# Patient Record
Sex: Female | Born: 1980 | Race: White | Hispanic: No | Smoking: Never smoker
Health system: Southern US, Community
[De-identification: ages and names within clinical notes are randomized; demographics above are authoritative.]

## PROBLEM LIST (undated history)

## (undated) ENCOUNTER — Inpatient Hospital Stay (HOSPITAL_COMMUNITY): Payer: Self-pay

## (undated) DIAGNOSIS — Z3491 Encounter for supervision of normal pregnancy, unspecified, first trimester: Secondary | ICD-10-CM

## (undated) DIAGNOSIS — F329 Major depressive disorder, single episode, unspecified: Secondary | ICD-10-CM

## (undated) DIAGNOSIS — G43909 Migraine, unspecified, not intractable, without status migrainosus: Secondary | ICD-10-CM

## (undated) DIAGNOSIS — R569 Unspecified convulsions: Secondary | ICD-10-CM

## (undated) DIAGNOSIS — F32A Depression, unspecified: Secondary | ICD-10-CM

## (undated) DIAGNOSIS — F419 Anxiety disorder, unspecified: Secondary | ICD-10-CM

## (undated) HISTORY — DX: Encounter for supervision of normal pregnancy, unspecified, first trimester: Z34.91

## (undated) HISTORY — PX: CHOLECYSTECTOMY: SHX55

## (undated) HISTORY — PX: WISDOM TOOTH EXTRACTION: SHX21

## (undated) HISTORY — DX: Migraine, unspecified, not intractable, without status migrainosus: G43.909

## (undated) HISTORY — DX: Major depressive disorder, single episode, unspecified: F32.9

## (undated) HISTORY — DX: Depression, unspecified: F32.A

---

## 2010-03-18 ENCOUNTER — Emergency Department (HOSPITAL_BASED_OUTPATIENT_CLINIC_OR_DEPARTMENT_OTHER): Admission: EM | Admit: 2010-03-18 | Discharge: 2010-03-18 | Payer: Self-pay | Admitting: Emergency Medicine

## 2010-03-18 ENCOUNTER — Ambulatory Visit: Payer: Self-pay | Admitting: Interventional Radiology

## 2010-05-07 ENCOUNTER — Emergency Department (HOSPITAL_BASED_OUTPATIENT_CLINIC_OR_DEPARTMENT_OTHER): Admission: EM | Admit: 2010-05-07 | Discharge: 2010-05-07 | Payer: Self-pay | Admitting: Emergency Medicine

## 2010-11-03 LAB — DIFFERENTIAL
Eosinophils Relative: 1 % (ref 0–5)
Neutrophils Relative %: 76 % (ref 43–77)

## 2010-11-03 LAB — URINE MICROSCOPIC-ADD ON

## 2010-11-03 LAB — COMPREHENSIVE METABOLIC PANEL
ALT: 25 U/L (ref 0–35)
AST: 21 U/L (ref 0–37)
Albumin: 3.9 g/dL (ref 3.5–5.2)
Alkaline Phosphatase: 152 U/L — ABNORMAL HIGH (ref 39–117)
Calcium: 9 mg/dL (ref 8.4–10.5)
Chloride: 109 mEq/L (ref 96–112)
GFR calc Af Amer: >60 mL/min (ref >60)
Glucose, Bld: 92 mg/dL (ref 70–99)
Sodium: 141 mEq/L (ref 135–145)
Total Bilirubin: 0.6 mg/dL (ref 0.3–1.2)

## 2010-11-03 LAB — CBC
Hemoglobin: 12.7 g/dL (ref 12.0–15.0)
MCH: 28.7 pg (ref 26.0–34.0)
MCHC: 33.9 g/dL (ref 30.0–36.0)
MCV: 84.9 fL (ref 78.0–100.0)
Platelets: 325 10*3/uL (ref 150–400)
RBC: 4.41 MIL/uL (ref 3.87–5.11)
RDW: 12.8 % (ref 11.5–15.5)
WBC: 9.1 10*3/uL (ref 4.0–10.5)

## 2010-11-03 LAB — URINALYSIS, ROUTINE W REFLEX MICROSCOPIC
Ketones, ur: NEGATIVE mg/dL
Leukocytes, UA: NEGATIVE
Nitrite: NEGATIVE
Protein, ur: NEGATIVE mg/dL

## 2010-11-03 LAB — POCT TOXICOLOGY PANEL

## 2010-11-03 LAB — ETHANOL: Alcohol, Ethyl (B): <5 mg/dL (ref 0–10)

## 2011-09-14 IMAGING — CT CT HEAD W/O CM
1 series · 16 of 30 positions shown, 20 images · non-contrast
Comparison: None.

CLINICAL DATA: Fall with injury to region of right forehead.

CT HEAD WITHOUT CONTRAST
TECHNIQUE: Contiguous axial images were obtained from the base of
the skull through the vertex without contrast

[Series 2: head 4.8 h37s · axial · 0.44mm/px · z∈[+1137,+1270]mm · 16 of 32 slices shown, 20 images]
[im 2/32  brain]
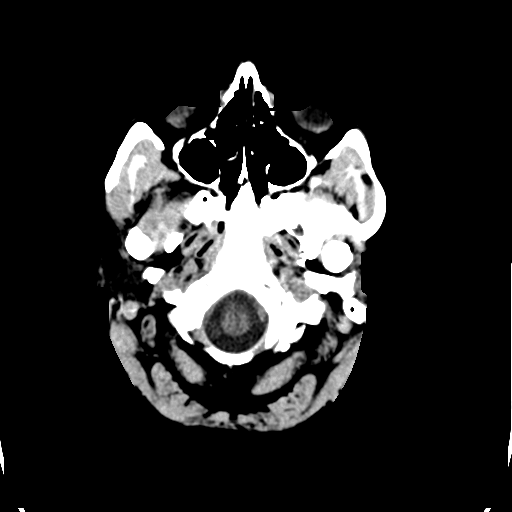
[im 2/32  bone]
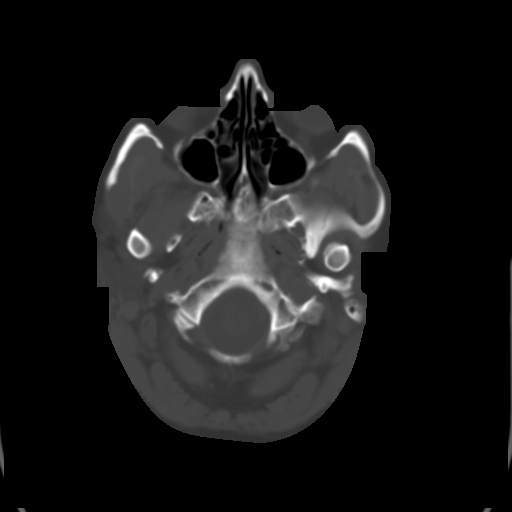
[im 4/32  brain]
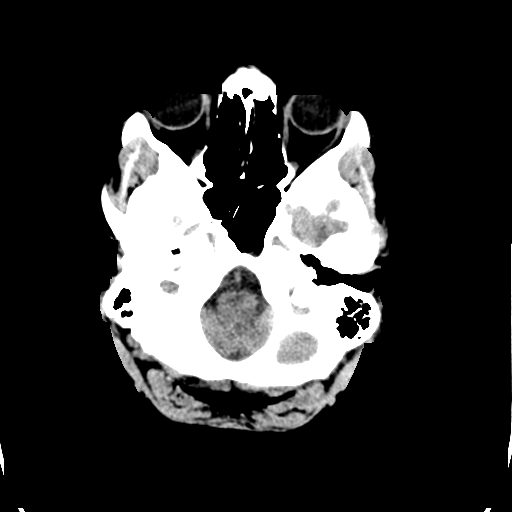
[im 6/32  brain]
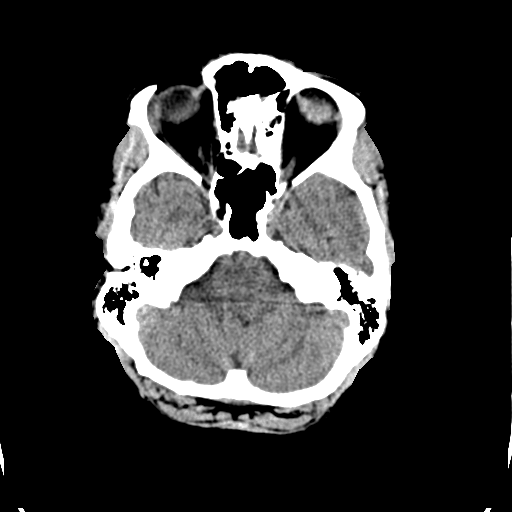
[im 8/32  brain]
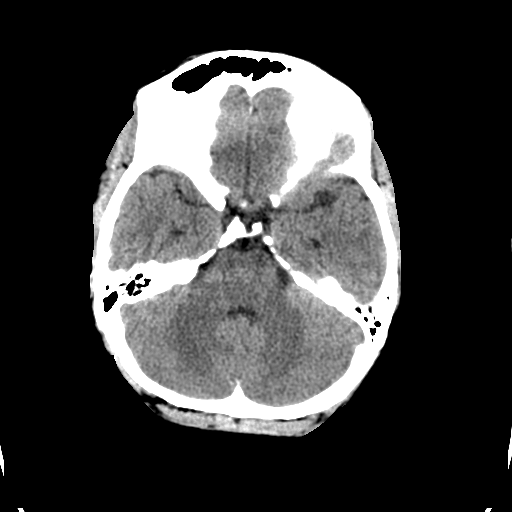
[im 9/32  brain]
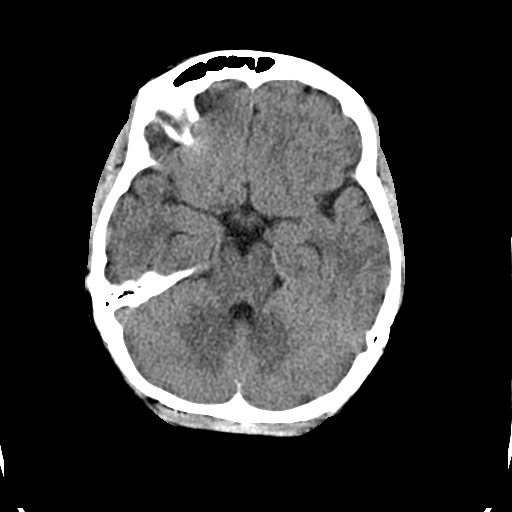
[im 9/32  bone]
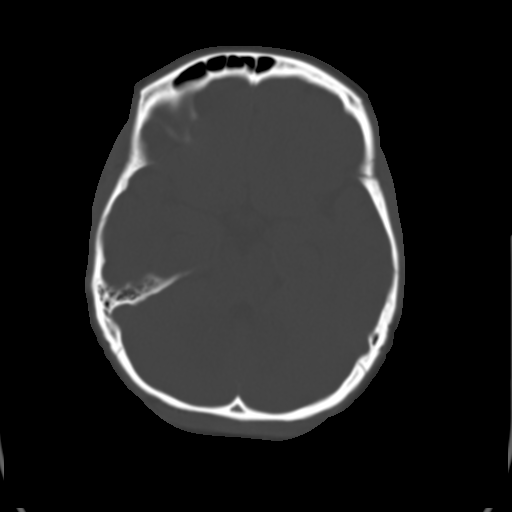
[im 11/32  brain]
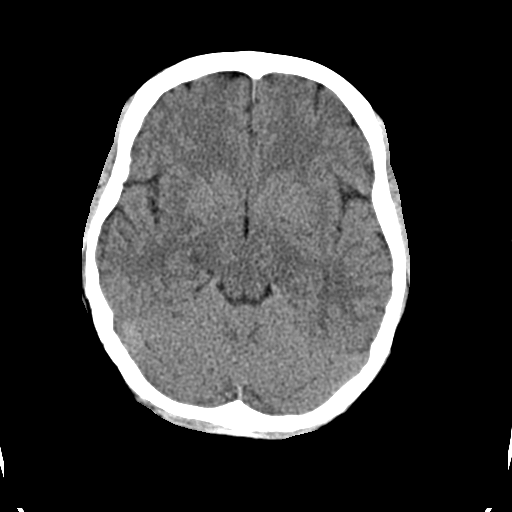
[im 13/32  brain]
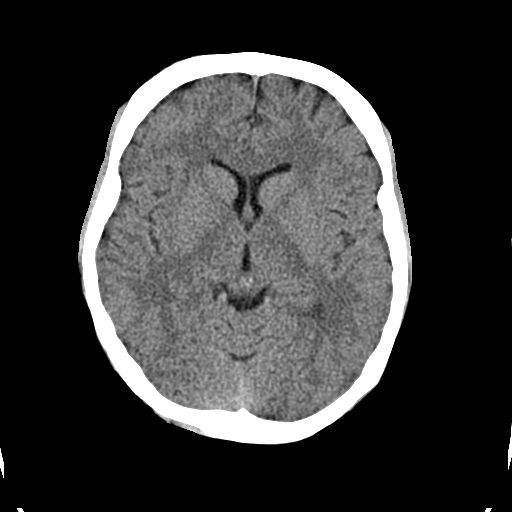
[im 15/32  brain]
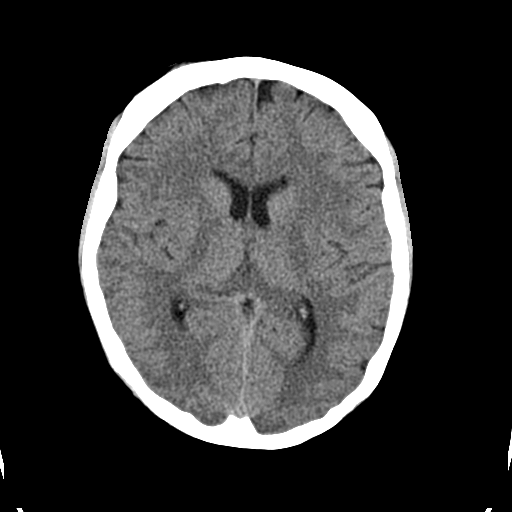
[im 17/32  brain]
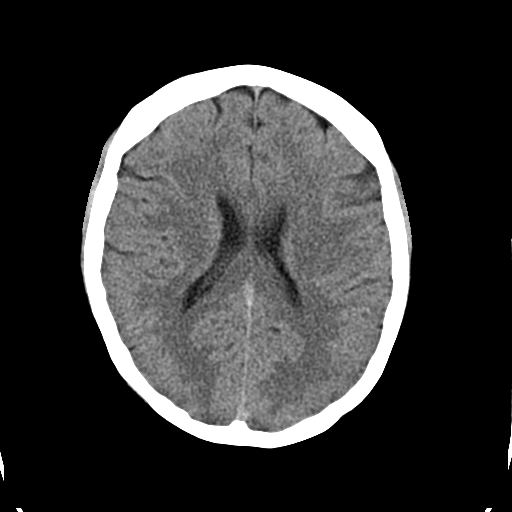
[im 17/32  bone]
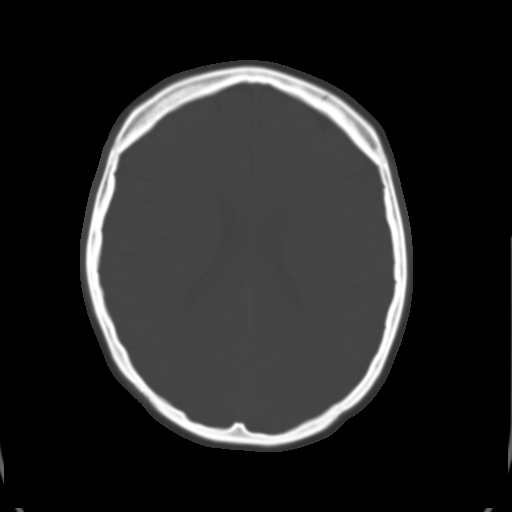
[im 19/32  brain]
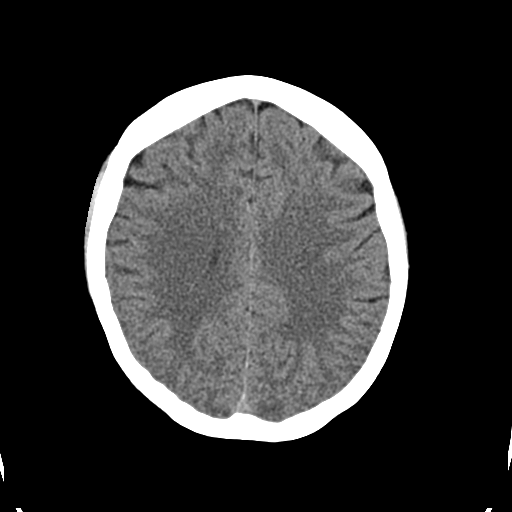
[im 21/32  brain]
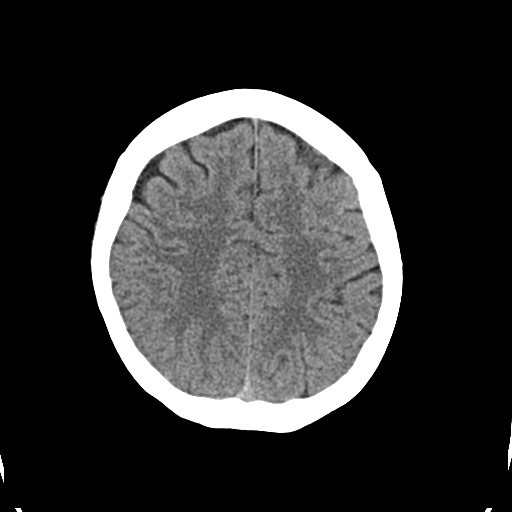
[im 23/32  brain]
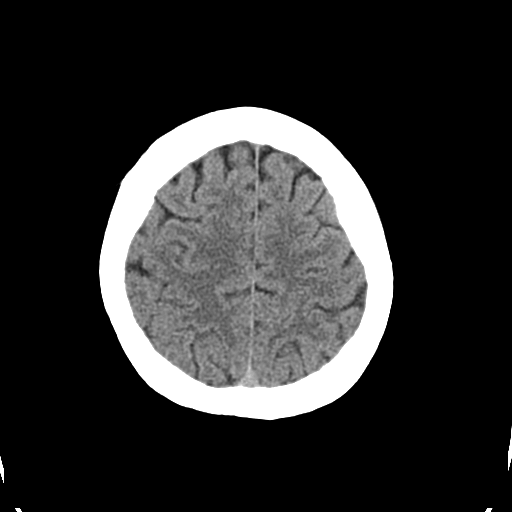
[im 24/32  brain]
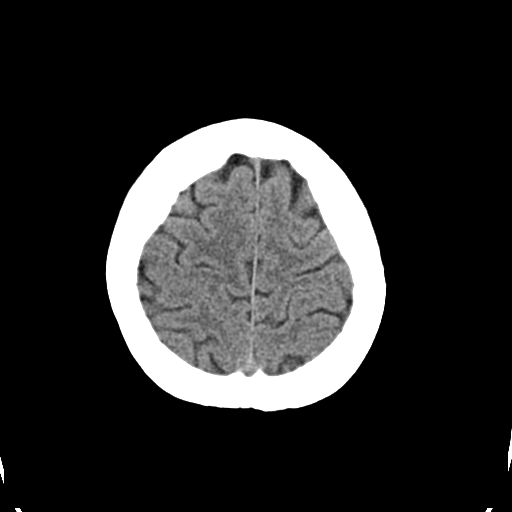
[im 24/32  bone]
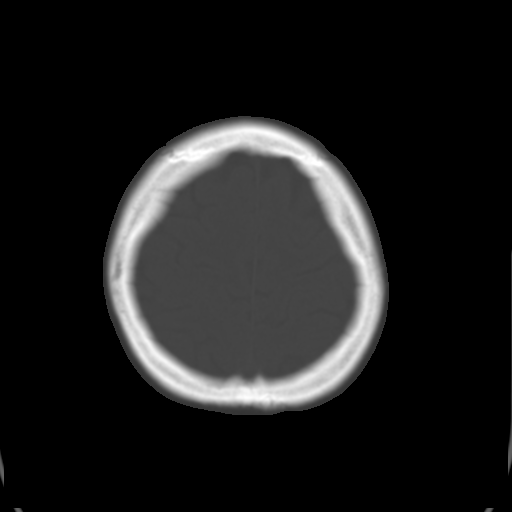
[im 26/32  brain]
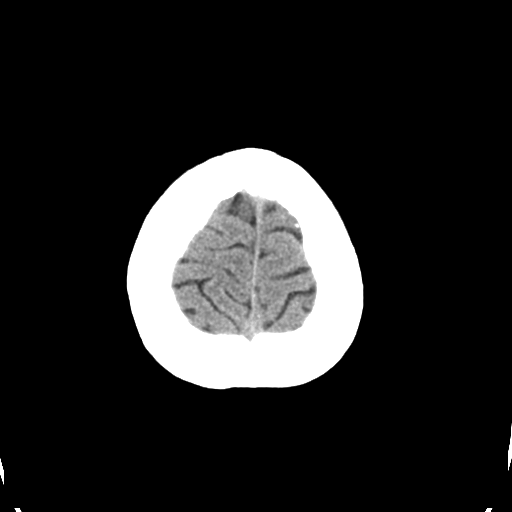
[im 28/32  brain]
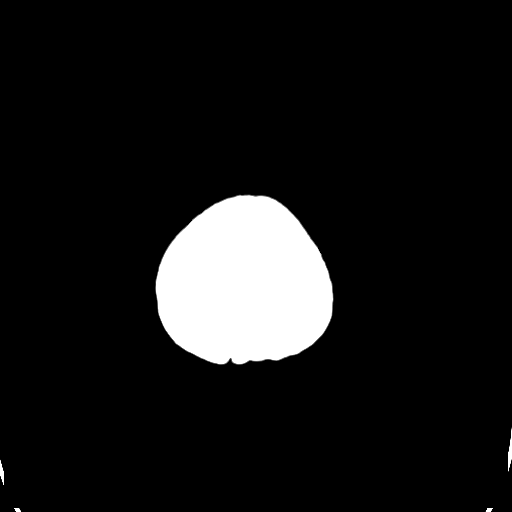
[im 30/32  brain]
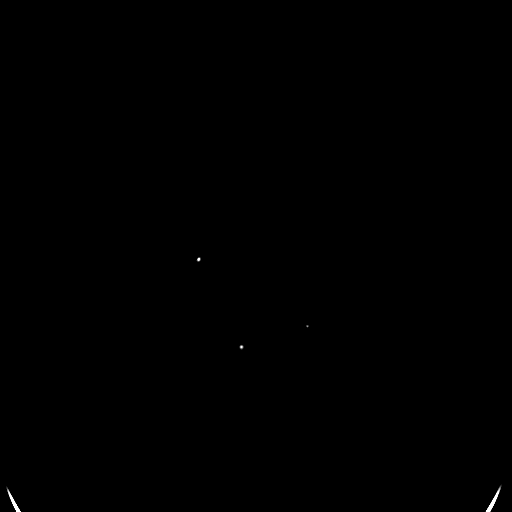

[16 of 30 positions shown; findings below may reference images not displayed]

FINDINGS: The brain has a normal appearance without evidence for
hemorrhage, acute infarction, hydrocephalus, or mass lesion.  There
is no extra axial fluid collection.  Minimal soft tissue swelling
present overlying the right frontal region without evidence of
underlying fracture or foreign body.
IMPRESSION: Normal CT of the head without contrast.

## 2011-12-27 ENCOUNTER — Emergency Department: Payer: Self-pay | Admitting: Emergency Medicine

## 2011-12-27 LAB — PREGNANCY, URINE: Pregnancy Test, Urine: NEGATIVE m[IU]/mL

## 2011-12-27 LAB — URINALYSIS, COMPLETE
Bacteria: NONE SEEN
Bilirubin,UR: NEGATIVE
Ph: 6 (ref 4.5–8.0)
Protein: 100
RBC,UR: 7536 /HPF (ref 0–5)
Squamous Epithelial: 3

## 2012-06-14 ENCOUNTER — Emergency Department: Payer: Self-pay | Admitting: Internal Medicine

## 2013-05-20 ENCOUNTER — Encounter: Payer: Self-pay | Admitting: Nurse Practitioner

## 2013-05-20 ENCOUNTER — Ambulatory Visit (INDEPENDENT_AMBULATORY_CARE_PROVIDER_SITE_OTHER): Payer: BC Managed Care – PPO | Admitting: Nurse Practitioner

## 2013-05-20 VITALS — BP 128/94 | Temp 98.4°F | Ht 63.5 in | Wt 148.0 lb

## 2013-05-20 DIAGNOSIS — J322 Chronic ethmoidal sinusitis: Secondary | ICD-10-CM

## 2013-05-20 MED ORDER — AMOXICILLIN 875 MG PO TABS: 875.0000 mg | ORAL_TABLET | Freq: Two times a day (BID) | ORAL | Status: DC

## 2013-05-20 NOTE — Progress Notes (Signed)
  Subjective:    Patient ID: Tina Weber, female    DOB: [birth year], 32 y.o.   MRN: [id number]  Otalgia  There is pain in the right ear. This is a new problem. The current episode started yesterday. The problem occurs constantly. There has been no fever. Associated symptoms include coughing, drainage, headaches and rhinorrhea. She has tried acetaminophen for the symptoms. The treatment provided mild relief.   has felt hot at times. Ethmoid sinus area headache. Nonproductive cough. Slightly green nasal drainage. No sore throat. Is not birth control, has been trying to conceive for the past 4 months. Just completed a normal menstrual cycle a few days ago. Right ear pain for the past 2 days.    Review of Systems  HENT: Positive for ear pain and rhinorrhea.   Respiratory: Positive for cough.   Neurological: Positive for headaches.       Objective:   Physical Exam NAD. Alert, oriented. TMs clear effusion, no erythema. Pharynx injected with PND noted. Neck supple mild soft nontender adenopathy. Lungs clear. Heart regular rate rhythm.       Assessment & Plan:  Ethmoid sinusitis  Meds ordered this encounter  Medications  . zonisamide (ZONEGRAN) 100 MG capsule    Sig: Take 100 mg by mouth 2 (two) times daily.  . Melatonin 5 MG CAPS    Sig: Take 5 mg by mouth.  Marland Kitchen amoxicillin (AMOXIL) 875 MG tablet    Sig: Take 1 tablet (875 mg total) by mouth 2 (two) times daily.    Dispense:  20 tablet    Refill:  0    Order Specific Question:  Supervising Provider    Answer:  Merlyn Albert [[id number]]   OTC meds as directed for congestion. Call back if symptoms worsen or persist.

## 2013-05-20 NOTE — Patient Instructions (Signed)
Loratadine 10 mg in the morning Benadryl 25 mg in the evening 

## 2013-07-07 ENCOUNTER — Telehealth: Payer: Self-pay | Admitting: Family Medicine

## 2013-07-07 NOTE — Telephone Encounter (Signed)
error 

## 2013-07-07 NOTE — Telephone Encounter (Signed)
Patient says that she is having a hard time sleeping at night and is affecting her job,

## 2013-07-13 ENCOUNTER — Ambulatory Visit (INDEPENDENT_AMBULATORY_CARE_PROVIDER_SITE_OTHER): Payer: BC Managed Care – PPO | Admitting: Family Medicine

## 2013-07-13 ENCOUNTER — Encounter: Payer: Self-pay | Admitting: Family Medicine

## 2013-07-13 VITALS — BP 100/64 | Ht 63.5 in | Wt 152.2 lb

## 2013-07-13 DIAGNOSIS — G4700 Insomnia, unspecified: Secondary | ICD-10-CM

## 2013-07-13 MED ORDER — ALPRAZOLAM 0.5 MG PO TABS: 0.5000 mg | ORAL_TABLET | Freq: Every evening | ORAL | Status: DC | PRN

## 2013-07-13 MED ORDER — ALPRAZOLAM ER 0.5 MG PO TB24: 0.5000 mg | ORAL_TABLET | Freq: Every evening | ORAL | Status: DC | PRN

## 2013-07-13 NOTE — Progress Notes (Signed)
  Subjective:    Patient ID: Tina Weber, female    DOB: 1981/07/05, 32 y.o.   MRN: [id number]  HPIHaving touble sleeping for the past month. Having stress at work. Sleeps about 3 hours at night. Wakes up at 3:30 am to go to work. Has tried otc sleep aid, tylenol pm, and melatonin.   Hx of slight insomnia in the past took tyl pm  Worse this yr,  Lays down when dark, trouble getting to sleep, Took lunesta qhs prn bad taste in mouth  Doing job for three yrs, very stressed  Does not want anti dpressants. Claims no significant depression.  Review of Systems No headache no chest pain no back pain no abdominal pain ROS otherwise negative    Objective:   Physical Exam  Alert oriented x3 HEENT normal. Lungs clear. Heart regular in rhythm.      Assessment & Plan:  Impression insomnia discussed at length. Plan trial of a Presalin each bedtime. Exercise encourage. Stress reduction discussed. WSL

## 2013-07-14 ENCOUNTER — Telehealth: Payer: Self-pay | Admitting: Family Medicine

## 2013-07-14 NOTE — Telephone Encounter (Signed)
Pt.notified

## 2013-07-14 NOTE — Telephone Encounter (Signed)
Med called into [facility] [place].

## 2013-07-14 NOTE — Telephone Encounter (Signed)
Last entry error. May call in refill on Xanax. Thnaks

## 2013-07-14 NOTE — Telephone Encounter (Signed)
It would be no trouble to recheck the ear today, or we can call in a different antibiotic. Also can prescribe Tramadol for pain- caution drowsiness. See what she wants to do

## 2013-07-14 NOTE — Telephone Encounter (Signed)
Patient says she lost her prescription yesterday at [facility] for alprazolam. She would like to know if we can rewrite this for her.

## 2013-07-18 DIAGNOSIS — G4700 Insomnia, unspecified: Secondary | ICD-10-CM | POA: Insufficient documentation

## 2013-08-09 ENCOUNTER — Telehealth: Payer: Self-pay | Admitting: Family Medicine

## 2013-08-09 MED ORDER — ALPRAZOLAM 0.5 MG PO TABS: 0.5000 mg | ORAL_TABLET | Freq: Every evening | ORAL | Status: DC | PRN

## 2013-08-09 NOTE — Telephone Encounter (Signed)
Rx printed and faxed to [facility]. Patient notified.

## 2013-08-09 NOTE — Telephone Encounter (Signed)
ALPRAZolam (XANAX) 0.5 MG tablet   Please refill

## 2013-08-09 NOTE — Telephone Encounter (Signed)
Ok plus 3 ref 

## 2013-10-06 ENCOUNTER — Encounter (INDEPENDENT_AMBULATORY_CARE_PROVIDER_SITE_OTHER): Payer: Self-pay

## 2013-10-06 ENCOUNTER — Ambulatory Visit (INDEPENDENT_AMBULATORY_CARE_PROVIDER_SITE_OTHER): Payer: BC Managed Care – PPO | Admitting: Adult Health

## 2013-10-06 ENCOUNTER — Encounter: Payer: Self-pay | Admitting: Adult Health

## 2013-10-06 VITALS — BP 124/80 | Ht 64.0 in | Wt 154.0 lb

## 2013-10-06 DIAGNOSIS — Z3201 Encounter for pregnancy test, result positive: Secondary | ICD-10-CM

## 2013-10-06 LAB — POCT URINE PREGNANCY: PREG TEST UR: POSITIVE

## 2013-10-06 NOTE — Progress Notes (Signed)
Patient ID: Tina Weber, female   DOB: September 17, 1980, 33 y.o.   MRN: [id number] Pt here for pregnancy test, resulted positive. Pt to return in 1-2 weeks new ob. C/o some light brownish discharge, no pain or cramp. Pt encouraged to monitor if bright red bleeding call our office back. Samples of PNV given.

## 2013-10-13 ENCOUNTER — Telehealth: Payer: Self-pay | Admitting: Women's Health

## 2013-10-13 NOTE — Telephone Encounter (Signed)
Spoke with pt. Pt is early pregnant, has 1st US Friday. She was prescribed a med for seizures, Zonisanide. Was advised to get a safer med. Spoke with Dr. Despina HiddenEure. He advised we usually use Keppra during pregnancy, but he advised for pt to call neurologist and review it with him since they usually manage seizure meds. Pt voiced understanding. JSY

## 2013-10-15 ENCOUNTER — Other Ambulatory Visit: Payer: BC Managed Care – PPO

## 2013-10-15 ENCOUNTER — Ambulatory Visit (INDEPENDENT_AMBULATORY_CARE_PROVIDER_SITE_OTHER): Payer: BC Managed Care – PPO

## 2013-10-15 ENCOUNTER — Other Ambulatory Visit: Payer: Self-pay | Admitting: Obstetrics & Gynecology

## 2013-10-15 DIAGNOSIS — O26849 Uterine size-date discrepancy, unspecified trimester: Secondary | ICD-10-CM

## 2013-10-15 DIAGNOSIS — Z3400 Encounter for supervision of normal first pregnancy, unspecified trimester: Secondary | ICD-10-CM

## 2013-10-15 DIAGNOSIS — O3680X0 Pregnancy with inconclusive fetal viability, not applicable or unspecified: Secondary | ICD-10-CM

## 2013-10-15 DIAGNOSIS — O3410 Maternal care for benign tumor of corpus uteri, unspecified trimester: Secondary | ICD-10-CM

## 2013-10-15 NOTE — Progress Notes (Addendum)
U/S-transvaginal u/s performed, single intrauterine sac noted, no YS or fetal pole identified on today's wxam, pt with irregular cycles, +UPT on 10/05/2013, bilateral adnexa appears wnl with C.L. Noted on Rt, no free fluid noted within pelvis, ?Gs meas c/w 5+1wks, would like to reck for viability and date confirmation, endometrium=10.328mm, with posterior fibroid noted in fundus=11 x 10mm

## 2013-10-16 LAB — HCG, QUANTITATIVE, PREGNANCY: hCG, Beta Chain, Quant, S: 1401.3 m[IU]/mL

## 2013-10-17 ENCOUNTER — Encounter (HOSPITAL_COMMUNITY): Payer: Self-pay

## 2013-10-17 ENCOUNTER — Inpatient Hospital Stay (HOSPITAL_COMMUNITY): Payer: BC Managed Care – PPO

## 2013-10-17 ENCOUNTER — Inpatient Hospital Stay (HOSPITAL_COMMUNITY)
Admission: AD | Admit: 2013-10-17 | Discharge: 2013-10-17 | Disposition: A | Discharge status: Home / Self Care | Payer: BC Managed Care – PPO | Source: Ambulatory Visit | Attending: Obstetrics & Gynecology | Admitting: Obstetrics & Gynecology

## 2013-10-17 DIAGNOSIS — O200 Threatened abortion: Secondary | ICD-10-CM

## 2013-10-17 DIAGNOSIS — R109 Unspecified abdominal pain: Secondary | ICD-10-CM | POA: Insufficient documentation

## 2013-10-17 HISTORY — DX: Unspecified convulsions: R56.9

## 2013-10-17 LAB — WET PREP, GENITAL
Clue Cells Wet Prep HPF POC: NONE SEEN
Trich, Wet Prep: NONE SEEN
YEAST WET PREP: NONE SEEN

## 2013-10-17 LAB — ABO/RH: ABO/RH(D): O POS

## 2013-10-17 LAB — URINALYSIS, ROUTINE W REFLEX MICROSCOPIC
Bilirubin Urine: NEGATIVE
GLUCOSE, UA: NEGATIVE mg/dL
KETONES UR: NEGATIVE mg/dL
Leukocytes, UA: NEGATIVE
NITRITE: NEGATIVE
Protein, ur: NEGATIVE mg/dL
Specific Gravity, Urine: <1.005 — ABNORMAL LOW (ref 1.005–1.030)
Urobilinogen, UA: 0.2 mg/dL (ref 0.0–1.0)
pH: 6 (ref 5.0–8.0)

## 2013-10-17 LAB — CBC
HCT: 38.7 % (ref 36.0–46.0)
Hemoglobin: 13.4 g/dL (ref 12.0–15.0)
MCH: 29.3 pg (ref 26.0–34.0)
MCHC: 34.6 g/dL (ref 30.0–36.0)
MCV: 84.7 fL (ref 78.0–100.0)
Platelets: 211 10*3/uL (ref 150–400)
RBC: 4.57 MIL/uL (ref 3.87–5.11)
RDW: 12.1 % (ref 11.5–15.5)
WBC: 7.6 10*3/uL (ref 4.0–10.5)

## 2013-10-17 LAB — HCG, QUANTITATIVE, PREGNANCY: HCG, BETA CHAIN, QUANT, S: 1209 m[IU]/mL — AB (ref <5)

## 2013-10-17 LAB — URINE MICROSCOPIC-ADD ON

## 2013-10-17 MED ORDER — IBUPROFEN 600 MG PO TABS: 600.0000 mg | ORAL_TABLET | Freq: Four times a day (QID) | ORAL | Status: DC | PRN

## 2013-10-17 NOTE — MAU Provider Note (Signed)
Attestation of Attending Supervision of Advanced Practitioner (PA/CNM/NP): Evaluation and management procedures were performed by the Advanced Practitioner under my supervision and collaboration.  I have reviewed the Advanced Practitioner's note and chart, and I agree with the management and plan.  Decie Verne, MD, FACOG Attending Obstetrician & Gynecologist Faculty Practice, [facility]  

## 2013-10-17 NOTE — MAU Provider Note (Signed)
Chief Complaint: Vaginal Bleeding   First Provider Initiated Contact with Patient 10/17/13 1055     SUBJECTIVE HPI: Tina Weber is a 33 y.o. G1P0 at 108w3d by LMP who presents to maternity admissions reporting vaginal spotting x2 weeks with heavier spotting starting today, accompanied by light cramping starting yesterday.  She was seen at [facility] on Friday and had quant hcg of 1401 and U/S with gestational sac correlating with [redacted]w[redacted]d pregnancy, no yolk sac or fetal pole.  She denies vaginal itching/burning, urinary symptoms, h/a, dizziness, n/v, or fever/chills.     Past Medical History  Diagnosis Date  . Seizures     Last seizure almost one year ago   Past Surgical History  Procedure Laterality Date  . Wisdom tooth extraction    . Cholecystectomy     History   Social History  . Marital Status: Married    Spouse Name: N/A    Number of Children: N/A  . Years of Education: N/A   Occupational History  . Not on file.   Social History Main Topics  . Smoking status: Never Smoker   . Smokeless tobacco: Never Used  . Alcohol Use: Yes     Comment: before positive pregnancy test  . Drug Use: No  . Sexual Activity: Yes    Birth Control/ Protection: None   Other Topics Concern  . Not on file   Social History Narrative  . No narrative on file   No current facility-administered medications on file prior to encounter.   Current Outpatient Prescriptions on File Prior to Encounter  Medication Sig Dispense Refill  . zonisamide (ZONEGRAN) 100 MG capsule Take 100 mg by mouth 2 (two) times daily.       No Known Allergies  ROS: Pertinent items in HPI  OBJECTIVE Blood pressure 140/90, pulse 97, temperature 98.3 F (36.8 C), temperature source Oral, resp. rate 18, last menstrual period 08/25/2013. GENERAL: Well-developed, well-nourished female in no acute distress.  HEENT: Normocephalic HEART: normal rate RESP: normal effort ABDOMEN: Soft, non-tender EXTREMITIES: Nontender, no  edema NEURO: Alert and oriented Pelvic exam: Cervix pink, visually closed, without lesion, moderate amount dark brown bleeding, vaginal walls and external genitalia normal Bimanual exam: Cervix 0/long/high, firm, anterior, neg CMT, uterus nontender, nonenlarged, adnexa without tenderness, enlargement, or mass  LAB RESULTS Results for orders placed during the hospital encounter of 10/17/13 (from the past 24 hour(s))  URINALYSIS, ROUTINE W REFLEX MICROSCOPIC     Status: Abnormal   Collection Time    10/17/13  9:58 AM      Result Value Ref Range   Color, Urine YELLOW  YELLOW   APPearance CLEAR  CLEAR   Specific Gravity, Urine <1.005 (*) 1.005 - 1.030   pH 6.0  5.0 - 8.0   Glucose, UA NEGATIVE  NEGATIVE mg/dL   Hgb urine dipstick TRACE (*) NEGATIVE   Bilirubin Urine NEGATIVE  NEGATIVE   Ketones, ur NEGATIVE  NEGATIVE mg/dL   Protein, ur NEGATIVE  NEGATIVE mg/dL   Urobilinogen, UA 0.2  0.0 - 1.0 mg/dL   Nitrite NEGATIVE  NEGATIVE   Leukocytes, UA NEGATIVE  NEGATIVE  URINE MICROSCOPIC-ADD ON     Status: None   Collection Time    10/17/13  9:58 AM      Result Value Ref Range   Squamous Epithelial / LPF RARE  RARE   RBC / HPF 0-2  <3 RBC/hpf  HCG, QUANTITATIVE, PREGNANCY     Status: Abnormal   Collection Time  10/17/13 10:34 AM      Result Value Ref Range   hCG, Beta Chain, Quant, S 1209 (*) <5 mIU/mL  CBC     Status: None   Collection Time    10/17/13 10:34 AM      Result Value Ref Range   WBC 7.6  4.0 - 10.5 K/uL   RBC 4.57  3.87 - 5.11 MIL/uL   Hemoglobin 13.4  12.0 - 15.0 g/dL   HCT 45.438.7  09.836.0 - 11.946.0 %   MCV 84.7  78.0 - 100.0 fL   MCH 29.3  26.0 - 34.0 pg   MCHC 34.6  30.0 - 36.0 g/dL   RDW 14.712.1  82.911.5 - 56.215.5 %   Platelets 211  150 - 400 K/uL  ABO/RH     Status: None   Collection Time    10/17/13 10:34 AM      Result Value Ref Range   ABO/RH(D) O POS    WET PREP, GENITAL     Status: Abnormal   Collection Time    10/17/13 11:02 AM      Result Value Ref Range    Yeast Wet Prep HPF POC NONE SEEN  NONE SEEN   Trich, Wet Prep NONE SEEN  NONE SEEN   Clue Cells Wet Prep HPF POC NONE SEEN  NONE SEEN   WBC, Wet Prep HPF POC FEW (*) NONE SEEN      IMAGING [place] Ob Comp Less 14 Wks  10/17/2013   CLINICAL DATA:  Bleeding, pregnant  EXAM: OBSTETRIC <14 WK US AND TRANSVAGINAL OB US  TECHNIQUE: Both transabdominal and transvaginal ultrasound examinations were performed for complete evaluation of the gestation as well as the maternal uterus, adnexal regions, and pelvic cul-de-sac. Transvaginal technique was performed to assess early pregnancy.  COMPARISON:  None.  FINDINGS: Intrauterine gestational sac: Single  Yolk sac:  Not seen  Embryo:  Not seen  Cardiac Activity: Not seen  MSD:  5   mm   5 w   0  d  US EDC: 06/19/2014  Maternal uterus/adnexae: Unremarkable ovaries. Hypoechoic 11 x 8 x 10 mm probable posterior fundal fibroid. No free fluid.  IMPRESSION: 1. Probable early intrauterine gestational sac, but no yolk sac, fetal pole, or cardiac activity yet visualized. Recommend follow-up quantitative B-HCG levels and follow-up US in 14 days to confirm and assess viability. This recommendation follows SRU consensus guidelines: Diagnostic Criteria for Nonviable Pregnancy Early in the First Trimester. Malva Limes Engl J Med 2013; 130:8657-84; 369:1443-51.   Electronically Signed   By: Oley Balmaniel  Hassell M.D.   On: 10/17/2013 13:44   [place] Ob Transvaginal  10/17/2013   CLINICAL DATA:  Bleeding, pregnant  EXAM: OBSTETRIC <14 WK US AND TRANSVAGINAL OB US  TECHNIQUE: Both transabdominal and transvaginal ultrasound examinations were performed for complete evaluation of the gestation as well as the maternal uterus, adnexal regions, and pelvic cul-de-sac. Transvaginal technique was performed to assess early pregnancy.  COMPARISON:  None.  FINDINGS: Intrauterine gestational sac: Single  Yolk sac:  Not seen  Embryo:  Not seen  Cardiac Activity: Not seen  MSD:  5   mm   5 w   0  d  US EDC: 06/19/2014  Maternal  uterus/adnexae: Unremarkable ovaries. Hypoechoic 11 x 8 x 10 mm probable posterior fundal fibroid. No free fluid.  IMPRESSION: 1. Probable early intrauterine gestational sac, but no yolk sac, fetal pole, or cardiac activity yet visualized. Recommend follow-up quantitative B-HCG levels and follow-up US in 14 days  to confirm and assess viability. This recommendation follows SRU consensus guidelines: Diagnostic Criteria for Nonviable Pregnancy Early in the First Trimester. Malva Limes Med 2013; 161:0960-45.   Electronically Signed   By: Oley Balm M.D.   On: 10/17/2013 13:44    US Ob Transvaginal  10/15/2013   DATING AND VIABILITY SONOGRAM   FREDRICK DRAY is a 33 y.o. year old G1P0 with LMP 08/25/2013??  She  has irregular menstrual cycles.   She is here today for a confirmatory  initial sonogram. Pt had a +UPT at home on 10/05/2013    GESTATION: SINGLETON     FETAL ACTIVITY:          Heart rate         NO YS or Fetal Pole noted on today's exam  CERVIX: Appears long and closed   ADNEXA: The ovaries are normal.with C.L. Noted on Rt   Endometrium=10.39mm,  Posterior fundal fibroid noted = 11 x 10mm  GESTATIONAL AGE AND  BIOMETRICS:  Gestational criteria: Estimated Date of Delivery: None noted ??LMP and  irregular cycles  Previous Scans:0  GESTATIONAL SAC           4.2 mm         5+1 weeks  CROWN RUMP LENGTH            mm          weeks                                                                               AVERAGE EGA(BY THIS SCAN):   5+1 weeks  WORKING EDD( early ultrasound ):  ~06/16/2014 by ??GS     TECHNICIAN COMMENTS:  U/S-transvaginal u/s performed, single intrauterine sac noted, no YS or  fetal pole identified on today's wxam, pt with irregular cycles, +UPT on  10/05/2013, bilateral adnexa appears wnl with C.L. Noted on Rt, no free  fluid noted within pelvis, ?Gs meas c/w 5+1wks, would like to reck for  viability and date confirmation, endometrium=10.77mm, with posterior  fibroid noted in fundus=11 x  10mm        A copy of this report including all images has been saved and backed up to  a second source for retrieval if needed. All measures and details of the  anatomical scan, placentation, fluid volume and pelvic anatomy are  contained in that report.  Chari Manning 10/15/2013 10:54 AM     ASSESSMENT 1. Threatened abortion, antepartum     PLAN Discharge home with bleeding and ectopic precautions Return to MAU in 48 hours for repeat quant hcg Comfort packet given by RN Return to MAU sooner as needed    Medication List         ibuprofen 600 MG tablet  Commonly known as:  ADVIL,MOTRIN  Take 1 tablet (600 mg total) by mouth every 6 (six) hours as needed.     prenatal multivitamin Tabs tablet  Take 1 tablet by mouth daily at 12 noon.     zonisamide 100 MG capsule  Commonly known as:  ZONEGRAN  Take 100 mg by mouth 2 (two) times daily.       Follow-up Information   Follow  up with THE [facility] [place] [facility] In 2 days. (Return sooner as needed)    Contact information:   [address] [id number] [place] [address] [phone]      Sharen Counter Certified Nurse-Midwife 10/17/2013  3:05 PM

## 2013-10-17 NOTE — Discharge Instructions (Signed)
Threatened Miscarriage °Bleeding during the first 20 weeks of pregnancy is common. This is sometimes called a threatened miscarriage. This is a pregnancy that is threatening to end before the twentieth week of pregnancy. Often this bleeding stops with bed rest or decreased activities as suggested by your caregiver and the pregnancy continues without any more problems. You may be asked to not have sexual intercourse, have orgasms or use tampons until further notice. Sometimes a threatened miscarriage can progress to a complete or incomplete miscarriage. This may or may not require further treatment. Some miscarriages occur before a woman misses a menstrual period and knows she is pregnant. °Miscarriages occur in 15 to 20% of all pregnancies and usually occur during the first 13 weeks of the pregnancy. The exact cause of a miscarriage is usually never known. A miscarriage is natures way of ending a pregnancy that is abnormal or would not make it to term. There are some things that may put you at risk to have a miscarriage, such as: °· Hormone problems. °· Infection of the uterus or cervix. °· Chronic illness, diabetes for example, especially if it is not controlled. °· Abnormal shaped uterus. °· Fibroids in the uterus. °· Incompetent cervix (the cervix is too weak to hold the baby). °· Smoking. °· Drinking too much alcohol. It's best not to drink any alcohol when you are pregnant. °· Taking illegal drugs. °TREATMENT  °When a miscarriage becomes complete and all products of conception (all the tissue in the uterus) have been passed, often no treatment is needed. If you think you passed tissue, save it in a container and take it to your doctor for evaluation. If the miscarriage is incomplete (parts of the fetus or placenta remain in the uterus), further treatment may be needed. The most common reason for further treatment is continued bleeding (hemorrhage) because pregnancy tissue did not pass out of the uterus. This  often occurs if a miscarriage is incomplete. Tissue left behind may also become infected. Treatment usually is dilatation and curettage (the removal of the remaining products of pregnancy. This can be done by a simple sucking procedure (suction curettage) or a simple scraping of the inside of the uterus. This may be done in the hospital or in the caregiver's office. This is only done when your caregiver knows that there is no chance for the pregnancy to proceed to term. This is determined by physical examination, negative pregnancy test, falling pregnancy hormone count and/or, an ultrasound revealing a dead fetus. °Miscarriages are often a very emotional time for prospective mothers and fathers. This is not you or your partners fault. It did not occur because of an inadequacy in you or your partner. Nearly all miscarriages occur because the pregnancy has started off wrongly. At least half of these pregnancies have a chromosomal abnormality. It is almost always not inherited. Others may have developmental problems with the fetus or placenta. This does not always show up even when the products miscarried are studied under the microscope. The miscarriage is nearly always not your fault and it is not likely that you could have prevented it from happening. If you are having emotional and grieving problems, talk to your health care provider and even seek counseling, if necessary, before getting pregnant again. You can begin trying for another pregnancy as soon as your caregiver says it is OK. °HOME CARE INSTRUCTIONS  °· Your caregiver may order bed rest depending on how much bleeding and cramping you are having. You may be limited   to only getting up to go to the bathroom. You may be allowed to continue light activity. You may need to make arrangements for the care of your other children and for any other responsibilities. °· Keep track of the number of pads you use each day, how often you have to change pads and how  saturated (soaked) they are. Record this information. °· DO NOT USE TAMPONS. Do not douche, have sexual intercourse or orgasms until approved by your caregiver. °· You may receive a follow up appointment for re-evaluation of your pregnancy and a repeat blood test. Re-evaluation often occurs after 2 days and again in 4 to 6 weeks. It is very important that you follow-up in the recommended time period. °· If you are Rh negative and the father is Rh positive or you do not know the fathers' blood type, you may receive a shot (Rh immune globulin) to help prevent abnormal antibodies that can develop and affect the baby in any future pregnancies. °SEEK IMMEDIATE MEDICAL CARE IF: °· You have severe cramps in your stomach, back, or abdomen. °· You have a sudden onset of severe pain in the lower part of your abdomen. °· You develop chills. °· You run an unexplained temperature of 101° F (38.3° C) or higher. °· You pass large clots or tissue. Save any tissue for your caregiver to inspect. °· Your bleeding increases or you become light-headed, weak, or have fainting episodes. °· You have a gush of fluid from your vagina. °· You pass out. This could mean you have a tubal (ectopic) pregnancy. °Document Released: 08/05/2005 Document Revised: 10/28/2011 Document Reviewed: 03/21/2008 °ExitCare® Patient Information ©2014 [employer]. ° °Ectopic Pregnancy °An ectopic pregnancy is when the fertilized egg attaches (implants) outside the uterus. Most ectopic pregnancies occur in the fallopian tube. Rarely do ectopic pregnancies occur on the ovary, intestine, pelvis, or cervix. In an ectopic pregnancy, the fertilized egg does not have the ability to develop into a normal, healthy baby.  °A ruptured ectopic pregnancy is one in which the fallopian tube gets torn or bursts and results in internal bleeding. Often there is intense abdominal pain, and sometimes, vaginal bleeding. Having an ectopic pregnancy can be life threatening. If left  untreated, this dangerous condition can lead to a blood transfusion, abdominal surgery, or even death. °CAUSES  °Damage to the fallopian tubes is the suspected cause in most ectopic pregnancies.  °RISK FACTORS °Depending on your circumstances, the risk of having an ectopic pregnancy will vary. The level of risk can be divided into three categories. °High Risk °· You have gone through infertility treatment. °· You have had a previous ectopic pregnancy. °· You have had previous tubal surgery. °· You have had previous surgery to have the fallopian tubes tied (tubal ligation). °· You have tubal problems or diseases. °· You have been exposed to DES. DES is a medicine that was used until 1971 and had effects on babies whose mothers took the medicine. °· You become pregnant while using an intrauterine device (IUD) for birth control.  °Moderate Risk °· You have a history of infertility. °· You have a history of a sexually transmitted infection (STI). °· You have a history of pelvic inflammatory disease (PID). °· You have scarring from endometriosis. °· You have multiple sexual partners. °· You smoke.  °Low Risk °· You have had previous pelvic surgery. °· You use vaginal douching. °· You became sexually active before 32 years of age. °SIGNS AND SYMPTOMS  °An ectopic pregnancy should be   suspected in anyone who has missed a period and has abdominal pain or bleeding. °· You may experience normal pregnancy symptoms, such as: °· Nausea. °· Tiredness. °· Breast tenderness. °· Other symptoms may include: °· Pain with intercourse. °· Irregular vaginal bleeding or spotting. °· Cramping or pain on one side or in the lower abdomen. °· Fast heartbeat. °· Passing out while having a bowel movement. °· Symptoms of a ruptured ectopic pregnancy and internal bleeding may include: °· Sudden, severe pain in the abdomen and pelvis. °· Dizziness or fainting. °· Pain in the shoulder area. °DIAGNOSIS  °Tests that may be performed include: °· A  pregnancy test. °· An ultrasound test. °· Testing the specific level of pregnancy hormone in the bloodstream. °· Taking a sample of uterus tissue (dilation and curettage, D&C). °· Surgery to perform a visual exam of the inside of the abdomen using a thin, lighted tube with a tiny camera on the end (laparoscope). °TREATMENT  °An injection of a medicine called methotrexate may be given. This medicine causes the pregnancy tissue to be absorbed. It is given if: °· The diagnosis is made early. °· The fallopian tube has not ruptured. °· You are considered to be a good candidate for the medicine. °Usually, pregnancy hormone blood levels are checked after methotrexate treatment. This is to be sure the medicine is effective. It may take 4 6 weeks for the pregnancy to be absorbed (though most pregnancies will be absorbed by 3 weeks). °Surgical treatment may be needed. A laparoscope may be used to remove the pregnancy tissue. If severe internal bleeding occurs, a cut (incision) may be made in the lower abdomen (laparotomy), and the ectopic pregnancy is removed. This stops the bleeding. Part of the fallopian tube, or the whole tube, may be removed as well (salpingectomy). After surgery, pregnancy hormone tests may be done to be sure there is no pregnancy tissue left. You may receive an Rho(D) immune globulin shot if you are Rh negative and the father is Rh positive, or if you do not know the Rh type of the father. This is to prevent problems with any future pregnancy. °SEEK IMMEDIATE MEDICAL CARE IF:  °You have any symptoms of an ectopic pregnancy. This is a medical emergency. °Document Released: 09/12/2004 Document Revised: 05/26/2013 Document Reviewed: 03/04/2013 °ExitCare® Patient Information ©2014 [employer]. ° °

## 2013-10-17 NOTE — MAU Note (Signed)
[facility]. Here for increased spotting and cramping that began yesterday. Cramping is in middle of lower abdomen. Has had spotting since before the 18th when pregnancy was verified.

## 2013-10-17 NOTE — Progress Notes (Signed)
Probable miscarriage

## 2013-10-18 LAB — GC/CHLAMYDIA PROBE AMP
CT PROBE, AMP APTIMA: NEGATIVE
GC PROBE AMP APTIMA: NEGATIVE

## 2013-10-19 ENCOUNTER — Telehealth: Payer: Self-pay | Admitting: Adult Health

## 2013-10-19 ENCOUNTER — Other Ambulatory Visit: Payer: BC Managed Care – PPO

## 2013-10-19 DIAGNOSIS — Z029 Encounter for administrative examinations, unspecified: Secondary | ICD-10-CM

## 2013-10-19 DIAGNOSIS — O200 Threatened abortion: Secondary | ICD-10-CM

## 2013-10-19 LAB — HCG, QUANTITATIVE, PREGNANCY: hCG, Beta Chain, Quant, S: 340.8 m[IU]/mL

## 2013-10-19 NOTE — Telephone Encounter (Signed)
Pt aware of labs, [facility] will cx US at 2pm

## 2013-10-25 ENCOUNTER — Encounter: Payer: BC Managed Care – PPO | Admitting: Adult Health

## 2013-10-25 ENCOUNTER — Other Ambulatory Visit: Payer: BC Managed Care – PPO

## 2013-10-25 DIAGNOSIS — O200 Threatened abortion: Secondary | ICD-10-CM

## 2013-10-26 ENCOUNTER — Telehealth: Payer: Self-pay | Admitting: Adult Health

## 2013-10-26 LAB — HCG, QUANTITATIVE, PREGNANCY: hCG, Beta Chain, Quant, S: 11.3 m[IU]/mL

## 2013-10-26 NOTE — Telephone Encounter (Signed)
Left message labs back and QHCG 11.3

## 2013-12-16 ENCOUNTER — Telehealth: Payer: Self-pay | Admitting: Family Medicine

## 2013-12-16 MED ORDER — ALPRAZOLAM 0.5 MG PO TABS: 0.5000 mg | ORAL_TABLET | Freq: Three times a day (TID) | ORAL | Status: DC | PRN

## 2013-12-16 NOTE — Telephone Encounter (Signed)
May have refill plus one additional refill she should do office visit before needing further

## 2013-12-16 NOTE — Telephone Encounter (Signed)
Patient wants refill on xanax 0.5 mg.Last refill was in 12/14 with 3 refills but prescription ran out before she had a chance to get it filled.Had to cancel appointment for tomorrow will she need another to get prescription filled.

## 2013-12-16 NOTE — Telephone Encounter (Signed)
Med faxed to pharmacy-Patient is currently not pregnant-Patient notified.

## 2013-12-16 NOTE — Addendum Note (Signed)
Addended by: Drake LeachBROWN, Theophile Harvie S on: 12/16/2013 05:00 PM   Modules accepted: Orders

## 2013-12-17 ENCOUNTER — Ambulatory Visit: Payer: BC Managed Care – PPO | Admitting: Family Medicine

## 2014-01-13 ENCOUNTER — Ambulatory Visit: Payer: BC Managed Care – PPO | Admitting: Family Medicine

## 2014-02-08 ENCOUNTER — Encounter (HOSPITAL_COMMUNITY): Payer: Self-pay

## 2014-02-11 ENCOUNTER — Ambulatory Visit (INDEPENDENT_AMBULATORY_CARE_PROVIDER_SITE_OTHER): Payer: BC Managed Care – PPO | Admitting: Family Medicine

## 2014-02-11 ENCOUNTER — Encounter: Payer: Self-pay | Admitting: Family Medicine

## 2014-02-11 VITALS — BP 112/74 | Temp 98.3°F | Ht 64.0 in | Wt 156.0 lb

## 2014-02-11 DIAGNOSIS — L03314 Cellulitis of groin: Secondary | ICD-10-CM

## 2014-02-11 DIAGNOSIS — L02219 Cutaneous abscess of trunk, unspecified: Secondary | ICD-10-CM

## 2014-02-11 DIAGNOSIS — L03319 Cellulitis of trunk, unspecified: Secondary | ICD-10-CM

## 2014-02-11 MED ORDER — ALPRAZOLAM 0.5 MG PO TABS: 0.5000 mg | ORAL_TABLET | Freq: Every day | ORAL | Status: DC

## 2014-02-11 MED ORDER — SULFAMETHOXAZOLE-TMP DS 800-160 MG PO TABS: 1.0000 | ORAL_TABLET | Freq: Two times a day (BID) | ORAL | Status: DC

## 2014-02-11 MED ORDER — HYDROCODONE-ACETAMINOPHEN 5-325 MG PO TABS: 1.0000 | ORAL_TABLET | ORAL | Status: DC | PRN

## 2014-02-11 NOTE — Progress Notes (Signed)
   Subjective:    Patient ID: Tina Weber, female    DOB: Dec 17, 1980, 33 y.o.   MRN: [id number]  HPIAbscess on vaginal area. Noticied it 2 days ago. Has been exposed to MRSA.   Patient has been exposed to MRSA through husband.  Recently shaved in the vaginal area.  Developed a small red bump which changed into a larger area which is fair he tender. No obvious discharge. No fever no chills.   Xanax rx used to tay one at night. Last time nsent in directions were one TID. Pt has still been doing one at  Night. Also taking tylenol pm and melotonin.     Review of Systems  no headache no chest pain no back pain no abdominal pain ROS otherwise negative     Objective:   Physical Exam  alert no acute distress. Vitals reviewed. Lungs clear. Heart rare rhythm. Perivaginal erythematous patch discretely tender no fluctuance        Assessment & Plan:   impression cellulitis with family history in nature of lesion need to cover for sure for MRSA discussed with patient. #2 insomnia encourage patient to schedule another visit to discussed. Plan Bactrim DS twice a day. Local measures discussed. Symptomatic care discussed. WSL

## 2014-03-07 ENCOUNTER — Ambulatory Visit (INDEPENDENT_AMBULATORY_CARE_PROVIDER_SITE_OTHER): Payer: BC Managed Care – PPO | Admitting: Adult Health

## 2014-03-07 ENCOUNTER — Encounter: Payer: Self-pay | Admitting: Adult Health

## 2014-03-07 DIAGNOSIS — Z3202 Encounter for pregnancy test, result negative: Secondary | ICD-10-CM

## 2014-03-07 DIAGNOSIS — Z3200 Encounter for pregnancy test, result unknown: Secondary | ICD-10-CM

## 2014-03-07 LAB — POCT URINE PREGNANCY: Preg Test, Ur: NEGATIVE

## 2014-03-07 NOTE — Progress Notes (Signed)
Pt here for pregnancy test. Negative result. Has had 2 positive results at home. Started spotting Saturday. Some cramping today. Advised quant today and we will go from there. Pt voiced understanding. JSY

## 2014-03-08 ENCOUNTER — Telehealth: Payer: Self-pay | Admitting: Adult Health

## 2014-03-08 ENCOUNTER — Telehealth: Payer: Self-pay | Admitting: Women's Health

## 2014-03-08 LAB — HCG, QUANTITATIVE, PREGNANCY: hCG, Beta Chain, Quant, S: 21.8 m[IU]/mL

## 2014-03-08 NOTE — Telephone Encounter (Signed)
No answer, left message for pt to return call. Will recheck HCG 7/22 around 1600 if possible.  Cheral MarkerKimberly R. Naithan Delage, CNM, [facility]WHNP-BC 03/08/2014 5:10 PM

## 2014-03-08 NOTE — Telephone Encounter (Signed)
Left message QHCG 21.8

## 2014-03-09 ENCOUNTER — Other Ambulatory Visit: Payer: BC Managed Care – PPO

## 2014-03-09 DIAGNOSIS — Z3200 Encounter for pregnancy test, result unknown: Secondary | ICD-10-CM

## 2014-03-10 ENCOUNTER — Telehealth: Payer: Self-pay | Admitting: Advanced Practice Midwife

## 2014-03-10 LAB — HCG, QUANTITATIVE, PREGNANCY: HCG, BETA CHAIN, QUANT, S: 16.6 m[IU]/mL

## 2014-03-10 NOTE — Telephone Encounter (Signed)
Left message for pt. Encounter closed. JSY

## 2014-03-10 NOTE — Telephone Encounter (Signed)
Left message letting pt know quant was 16.6 on 03/09/14 which is down from 21.8 on 03/07/14. Advised this will need to be repeated on 7/24 to see if it's still dropping. Pt to call and schedule an appt for another quant on 03/11/14. JSY

## 2014-03-10 NOTE — Telephone Encounter (Signed)
Left message x 1. JSY 

## 2014-03-14 ENCOUNTER — Other Ambulatory Visit: Payer: BC Managed Care – PPO

## 2014-04-14 ENCOUNTER — Ambulatory Visit (INDEPENDENT_AMBULATORY_CARE_PROVIDER_SITE_OTHER): Payer: BC Managed Care – PPO | Admitting: Family Medicine

## 2014-04-14 ENCOUNTER — Encounter: Payer: Self-pay | Admitting: Family Medicine

## 2014-04-14 VITALS — BP 126/88 | Ht 64.0 in | Wt 155.0 lb

## 2014-04-14 DIAGNOSIS — F32A Depression, unspecified: Secondary | ICD-10-CM

## 2014-04-14 DIAGNOSIS — F3289 Other specified depressive episodes: Secondary | ICD-10-CM

## 2014-04-14 DIAGNOSIS — F411 Generalized anxiety disorder: Secondary | ICD-10-CM

## 2014-04-14 DIAGNOSIS — F329 Major depressive disorder, single episode, unspecified: Secondary | ICD-10-CM

## 2014-04-14 MED ORDER — ALPRAZOLAM 1 MG PO TABS: ORAL_TABLET | ORAL | Status: DC

## 2014-04-14 MED ORDER — BUPROPION HCL ER (SR) 150 MG PO TB12: 150.0000 mg | ORAL_TABLET | Freq: Two times a day (BID) | ORAL | Status: DC

## 2014-04-14 NOTE — Progress Notes (Signed)
   Subjective:    Patient ID: Tina Weber, female    DOB: March 07, 1981, 33 y.o.   MRN: [id number]  Anxiety Presents for follow-up visit. Symptoms include excessive worry, insomnia, irritability, nervous/anxious behavior, palpitations and panic. The quality of sleep is poor. Nighttime awakenings: several (at least 4 times).   Treatments tried: xanax one at night and two tylenol pm's.   Job very stressful  Getting worse and worse, sig increased temper,  No chil , fiance married soon in November  Working six d per wk,  Job very physically demanding  wals a lot with the job   No sadness or crying, very anxious an danger  fa's side anxiety and mom's sde depr, but dleeps ok on prozac  celexa and paxil didn't help, semed to make things worse, Patient does not want any medications that can cause sexual side effects.  Non smoker   Review of Systems  Constitutional: Positive for irritability.  Cardiovascular: Positive for palpitations.  Psychiatric/Behavioral: The patient is nervous/anxious and has insomnia.        Objective:   Physical Exam Alert mild anxiousness no major distress. Vitals stable. HEENT normal. Lungs clear. Heart regular in rhythm. Ankles without edema.       Assessment & Plan:  Impression 1 fatigue considerable #2 depression and anxiety now becoming very dysfunctional. Patient reports no suicidal or homicidal thoughts. He's very frustrated by her current mental health. #3 insomnia Of note has seen a counselor and this is helped some but not a lot. Plan add daily Wellbutrin SR rationale discussed. Diet discussed exercise discussed. Each bedtime Xanax when necessary for sleep p.

## 2014-04-17 DIAGNOSIS — F32A Depression, unspecified: Secondary | ICD-10-CM | POA: Insufficient documentation

## 2014-04-17 DIAGNOSIS — F411 Generalized anxiety disorder: Secondary | ICD-10-CM | POA: Insufficient documentation

## 2014-04-17 DIAGNOSIS — F329 Major depressive disorder, single episode, unspecified: Secondary | ICD-10-CM | POA: Insufficient documentation

## 2014-04-20 ENCOUNTER — Ambulatory Visit: Payer: BC Managed Care – PPO | Admitting: Family Medicine

## 2014-05-19 ENCOUNTER — Other Ambulatory Visit: Payer: Self-pay | Admitting: Family Medicine

## 2014-05-20 NOTE — Telephone Encounter (Signed)
Ok plus five monthly ref 

## 2014-05-26 ENCOUNTER — Ambulatory Visit (INDEPENDENT_AMBULATORY_CARE_PROVIDER_SITE_OTHER): Payer: BC Managed Care – PPO | Admitting: Family Medicine

## 2014-05-26 ENCOUNTER — Encounter: Payer: Self-pay | Admitting: Family Medicine

## 2014-05-26 VITALS — BP 132/90 | Ht 64.0 in | Wt 148.0 lb

## 2014-05-26 DIAGNOSIS — F32A Depression, unspecified: Secondary | ICD-10-CM

## 2014-05-26 DIAGNOSIS — F329 Major depressive disorder, single episode, unspecified: Secondary | ICD-10-CM

## 2014-05-26 DIAGNOSIS — G4700 Insomnia, unspecified: Secondary | ICD-10-CM

## 2014-05-26 DIAGNOSIS — F411 Generalized anxiety disorder: Secondary | ICD-10-CM

## 2014-05-26 MED ORDER — ALPRAZOLAM 1 MG PO TABS: ORAL_TABLET | ORAL | Status: DC

## 2014-05-26 NOTE — Progress Notes (Signed)
   Subjective:    Patient ID: Tina Weber, female    DOB: [birth year], 33 y.o.   MRN: [id number]  Anxiety Presents for follow-up visit. Symptoms include depressed mood and insomnia. Symptoms occur most days. The severity of symptoms is interfering with daily activities. The quality of sleep is poor. Nighttime awakenings: several.   There are no known risk factors. Treatments tried: xanax, wellbutrin. The treatment provided mild relief. Compliance with prior treatments has been good. Compliance with medications is 76-100%.   Patient states that she has no other concerns at this time.   Still enjoys job  Found fiance was "cheating on her". Just found this out. 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  Has used primarily to her benzodiazepine for sleep but can also use some during the day for anxiety. States she will use a smaller dose. Patient was adamant that she did not want any serotonin builders. She feels overall the Wellbutrin has helped. She would definitely like to stay on it even though very slight increased risk of seizures.  Review of Systems  Psychiatric/Behavioral: The patient has insomnia.    no headache no chest pain no back pain no abdominal pain ROS otherwise negative     Objective:   Physical Exam Alert somewhat tearful during exam no homicidal or suicidal thoughts. Present with her mother. HEENT normal. Lungs clear. Heart regular in rhythm.       Assessment & Plan:  Impression 1 insomnia #2 anxiety/depression with recent grief discussed #3 seizure disorder neurologist has told patient she can remain on will teach him. Plan maintain Wellbutrin. Maintain Xanax. Increased dose somewhat for daytime use. Drowsy precautions discussed. Report any further seizures. Followup as scheduled. Exercise encourage. WSL

## 2014-06-20 ENCOUNTER — Encounter: Payer: Self-pay | Admitting: Family Medicine

## 2014-07-01 ENCOUNTER — Encounter: Payer: Self-pay | Admitting: Adult Health

## 2014-07-01 ENCOUNTER — Ambulatory Visit (INDEPENDENT_AMBULATORY_CARE_PROVIDER_SITE_OTHER): Payer: BC Managed Care – PPO | Admitting: Adult Health

## 2014-07-01 VITALS — BP 138/70 | Ht 64.0 in | Wt 143.0 lb

## 2014-07-01 DIAGNOSIS — Z3490 Encounter for supervision of normal pregnancy, unspecified, unspecified trimester: Secondary | ICD-10-CM

## 2014-07-01 DIAGNOSIS — Z3201 Encounter for pregnancy test, result positive: Secondary | ICD-10-CM

## 2014-07-01 LAB — POCT URINE PREGNANCY: PREG TEST UR: POSITIVE

## 2014-07-01 NOTE — Patient Instructions (Signed)
First Trimester of Pregnancy The first trimester of pregnancy is from week 1 until the end of week 12 (months 1 through 3). A week after a sperm fertilizes an egg, the egg will implant on the wall of the uterus. This embryo will begin to develop into a baby. Genes from you and your partner are forming the baby. The female genes determine whether the baby is a boy or a girl. At 6-8 weeks, the eyes and face are formed, and the heartbeat can be seen on ultrasound. At the end of 12 weeks, all the baby's organs are formed.  Now that you are pregnant, you will want to do everything you can to have a healthy baby. Two of the most important things are to get good prenatal care and to follow your health care provider's instructions. Prenatal care is all the medical care you receive before the baby's birth. This care will help prevent, find, and treat any problems during the pregnancy and childbirth. BODY CHANGES Your body goes through many changes during pregnancy. The changes vary from woman to woman.   You may gain or lose a couple of pounds at first.  You may feel sick to your stomach (nauseous) and throw up (vomit). If the vomiting is uncontrollable, call your health care provider.  You may tire easily.  You may develop headaches that can be relieved by medicines approved by your health care provider.  You may urinate more often. Painful urination may mean you have a bladder infection.  You may develop heartburn as a result of your pregnancy.  You may develop constipation because certain hormones are causing the muscles that push waste through your intestines to slow down.  You may develop hemorrhoids or swollen, bulging veins (varicose veins).  Your breasts may begin to grow larger and become tender. Your nipples may stick out more, and the tissue that surrounds them (areola) may become darker.  Your gums may bleed and may be sensitive to brushing and flossing.  Dark spots or blotches (chloasma,  mask of pregnancy) may develop on your face. This will likely fade after the baby is born.  Your menstrual periods will stop.  You may have a loss of appetite.  You may develop cravings for certain kinds of food.  You may have changes in your emotions from day to day, such as being excited to be pregnant or being concerned that something may go wrong with the pregnancy and baby.  You may have more vivid and strange dreams.  You may have changes in your hair. These can include thickening of your hair, rapid growth, and changes in texture. Some women also have hair loss during or after pregnancy, or hair that feels dry or thin. Your hair will most likely return to normal after your baby is born. WHAT TO EXPECT AT YOUR PRENATAL VISITS During a routine prenatal visit:  You will be weighed to make sure you and the baby are growing normally.  Your blood pressure will be taken.  Your abdomen will be measured to track your baby's growth.  The fetal heartbeat will be listened to starting around week 10 or 12 of your pregnancy.  Test results from any previous visits will be discussed. Your health care provider may ask you:  How you are feeling.  If you are feeling the baby move.  If you have had any abnormal symptoms, such as leaking fluid, bleeding, severe headaches, or abdominal cramping.  If you have any questions. Other tests   that may be performed during your first trimester include:  Blood tests to find your blood type and to check for the presence of any previous infections. They will also be used to check for low iron levels (anemia) and Rh antibodies. Later in the pregnancy, blood tests for diabetes will be done along with other tests if problems develop.  Urine tests to check for infections, diabetes, or protein in the urine.  An ultrasound to confirm the proper growth and development of the baby.  An amniocentesis to check for possible genetic problems.  Fetal screens for  spina bifida and Down syndrome.  You may need other tests to make sure you and the baby are doing well. HOME CARE INSTRUCTIONS  Medicines  Follow your health care provider's instructions regarding medicine use. Specific medicines may be either safe or unsafe to take during pregnancy.  Take your prenatal vitamins as directed.  If you develop constipation, try taking a stool softener if your health care provider approves. Diet  Eat regular, well-balanced meals. Choose a variety of foods, such as meat or vegetable-based protein, fish, milk and low-fat dairy products, vegetables, fruits, and whole grain breads and cereals. Your health care provider will help you determine the amount of weight gain that is right for you.  Avoid raw meat and uncooked cheese. These carry germs that can cause birth defects in the baby.  Eating four or five small meals rather than three large meals a day may help relieve nausea and vomiting. If you start to feel nauseous, eating a few soda crackers can be helpful. Drinking liquids between meals instead of during meals also seems to help nausea and vomiting.  If you develop constipation, eat more high-fiber foods, such as fresh vegetables or fruit and whole grains. Drink enough fluids to keep your urine clear or pale yellow. Activity and Exercise  Exercise only as directed by your health care provider. Exercising will help you:  Control your weight.  Stay in shape.  Be prepared for labor and delivery.  Experiencing pain or cramping in the lower abdomen or low back is a good sign that you should stop exercising. Check with your health care provider before continuing normal exercises.  Try to avoid standing for long periods of time. Move your legs often if you must stand in one place for a long time.  Avoid heavy lifting.  Wear low-heeled shoes, and practice good posture.  You may continue to have sex unless your health care provider directs you  otherwise. Relief of Pain or Discomfort  Wear a good support bra for breast tenderness.   Take warm sitz baths to soothe any pain or discomfort caused by hemorrhoids. Use hemorrhoid cream if your health care provider approves.   Rest with your legs elevated if you have leg cramps or low back pain.  If you develop varicose veins in your legs, wear support hose. Elevate your feet for 15 minutes, 3-4 times a day. Limit salt in your diet. Prenatal Care  Schedule your prenatal visits by the twelfth week of pregnancy. They are usually scheduled monthly at first, then more often in the last 2 months before delivery.  Write down your questions. Take them to your prenatal visits.  Keep all your prenatal visits as directed by your health care provider. Safety  Wear your seat belt at all times when driving.  Make a list of emergency phone numbers, including numbers for family, friends, the hospital, and police and fire departments. General Tips    Ask your health care provider for a referral to a local prenatal education class. Begin classes no later than at the beginning of month 6 of your pregnancy.  Ask for help if you have counseling or nutritional needs during pregnancy. Your health care provider can offer advice or refer you to specialists for help with various needs.  Do not use hot tubs, steam rooms, or saunas.  Do not douche or use tampons or scented sanitary pads.  Do not cross your legs for long periods of time.  Avoid cat litter boxes and soil used by cats. These carry germs that can cause birth defects in the baby and possibly loss of the fetus by miscarriage or stillbirth.  Avoid all smoking, herbs, alcohol, and medicines not prescribed by your health care provider. Chemicals in these affect the formation and growth of the baby.  Schedule a dentist appointment. At home, brush your teeth with a soft toothbrush and be gentle when you floss. SEEK MEDICAL CARE IF:   You have  dizziness.  You have mild pelvic cramps, pelvic pressure, or nagging pain in the abdominal area.  You have persistent nausea, vomiting, or diarrhea.  You have a bad smelling vaginal discharge.  You have pain with urination.  You notice increased swelling in your face, hands, legs, or ankles. SEEK IMMEDIATE MEDICAL CARE IF:   You have a fever.  You are leaking fluid from your vagina.  You have spotting or bleeding from your vagina.  You have severe abdominal cramping or pain.  You have rapid weight gain or loss.  You vomit blood or material that looks like coffee grounds.  You are exposed to [place] measles and have never had them.  You are exposed to fifth disease or chickenpox.  You develop a severe headache.  You have shortness of breath.  You have any kind of trauma, such as from a fall or a car accident. Document Released: 07/30/2001 Document Revised: 12/20/2013 Document Reviewed: 06/15/2013 [employer] Patient Information 2015 [employer]. This information is not intended to replace advice given to you by your health care provider. Make sure you discuss any questions you have with your health care provider. Return in 2 weeks for dating US 

## 2014-07-01 NOTE — Progress Notes (Signed)
Subjective:     Patient ID: Tina ReddenJennifer D Weber, female   DOB: June 18, 1981, 33 y.o.   MRN: [id number]  HPI Tina Weber is a 33 year old white female in for UPT.  Review of Systems See HPI Reviewed past medical,surgical, social and family history. Reviewed medications and allergies.     Objective:   Physical Exam BP 138/70 mmHg  Ht 5\' 4"  (1.626 m)  Wt 143 lb (64.864 kg)  BMI 24.53 kg/m2  LMP 05/31/2014   UPT +, about 4+3 weeks by LMP with EDD 03/08/15,but period not normal.Try to stop xanax, can continue Wellbutrin, had miscarriage in March [facility].Blood type O+.  Assessment:     Pregnant +UPT    Plan:     Check QHCG and progesterone level today Return in 2 weeks for dating US  Review handout on first trimester

## 2014-07-02 LAB — HCG, QUANTITATIVE, PREGNANCY: HCG, BETA CHAIN, QUANT, S: 1486.9 m[IU]/mL

## 2014-07-02 LAB — PROGESTERONE: PROGESTERONE: 21.7 ng/mL

## 2014-07-04 ENCOUNTER — Telehealth: Payer: Self-pay | Admitting: Adult Health

## 2014-07-04 NOTE — Telephone Encounter (Signed)
[facility] and progesterone was excellent at 21.7Pt aware labs good will give her a copy

## 2014-07-07 ENCOUNTER — Encounter: Payer: BC Managed Care – PPO | Admitting: Adult Health

## 2014-07-08 ENCOUNTER — Encounter: Payer: BC Managed Care – PPO | Admitting: Adult Health

## 2014-07-19 ENCOUNTER — Encounter: Payer: Self-pay | Admitting: Adult Health

## 2014-07-19 ENCOUNTER — Ambulatory Visit (INDEPENDENT_AMBULATORY_CARE_PROVIDER_SITE_OTHER): Payer: BC Managed Care – PPO | Admitting: Adult Health

## 2014-07-19 ENCOUNTER — Ambulatory Visit (INDEPENDENT_AMBULATORY_CARE_PROVIDER_SITE_OTHER): Payer: BC Managed Care – PPO

## 2014-07-19 ENCOUNTER — Other Ambulatory Visit (HOSPITAL_COMMUNITY)
Admission: RE | Admit: 2014-07-19 | Discharge: 2014-07-19 | Disposition: A | Discharge status: Home / Self Care | Payer: BC Managed Care – PPO | Source: Ambulatory Visit | Attending: Adult Health | Admitting: Adult Health

## 2014-07-19 ENCOUNTER — Other Ambulatory Visit: Payer: Self-pay | Admitting: Adult Health

## 2014-07-19 VITALS — BP 110/80 | Wt 143.0 lb

## 2014-07-19 DIAGNOSIS — Z113 Encounter for screening for infections with a predominantly sexual mode of transmission: Secondary | ICD-10-CM | POA: Diagnosis present

## 2014-07-19 DIAGNOSIS — Z01411 Encounter for gynecological examination (general) (routine) with abnormal findings: Secondary | ICD-10-CM | POA: Diagnosis present

## 2014-07-19 DIAGNOSIS — Z3490 Encounter for supervision of normal pregnancy, unspecified, unspecified trimester: Secondary | ICD-10-CM

## 2014-07-19 DIAGNOSIS — Z3491 Encounter for supervision of normal pregnancy, unspecified, first trimester: Secondary | ICD-10-CM

## 2014-07-19 DIAGNOSIS — Z124 Encounter for screening for malignant neoplasm of cervix: Secondary | ICD-10-CM

## 2014-07-19 DIAGNOSIS — Z1371 Encounter for nonprocreative screening for genetic disease carrier status: Secondary | ICD-10-CM

## 2014-07-19 DIAGNOSIS — O09899 Supervision of other high risk pregnancies, unspecified trimester: Secondary | ICD-10-CM | POA: Insufficient documentation

## 2014-07-19 DIAGNOSIS — Z3481 Encounter for supervision of other normal pregnancy, first trimester: Secondary | ICD-10-CM

## 2014-07-19 DIAGNOSIS — O3680X1 Pregnancy with inconclusive fetal viability, fetus 1: Secondary | ICD-10-CM

## 2014-07-19 DIAGNOSIS — G4700 Insomnia, unspecified: Secondary | ICD-10-CM

## 2014-07-19 DIAGNOSIS — Z0283 Encounter for blood-alcohol and blood-drug test: Secondary | ICD-10-CM

## 2014-07-19 DIAGNOSIS — Z1389 Encounter for screening for other disorder: Secondary | ICD-10-CM

## 2014-07-19 DIAGNOSIS — Z114 Encounter for screening for human immunodeficiency virus [HIV]: Secondary | ICD-10-CM

## 2014-07-19 DIAGNOSIS — F411 Generalized anxiety disorder: Secondary | ICD-10-CM

## 2014-07-19 DIAGNOSIS — F32A Depression, unspecified: Secondary | ICD-10-CM

## 2014-07-19 DIAGNOSIS — R87810 Cervical high risk human papillomavirus (HPV) DNA test positive: Secondary | ICD-10-CM | POA: Insufficient documentation

## 2014-07-19 DIAGNOSIS — Z331 Pregnant state, incidental: Secondary | ICD-10-CM

## 2014-07-19 DIAGNOSIS — F329 Major depressive disorder, single episode, unspecified: Secondary | ICD-10-CM

## 2014-07-19 DIAGNOSIS — Z1329 Encounter for screening for other suspected endocrine disorder: Secondary | ICD-10-CM

## 2014-07-19 DIAGNOSIS — Z1151 Encounter for screening for human papillomavirus (HPV): Secondary | ICD-10-CM | POA: Diagnosis present

## 2014-07-19 DIAGNOSIS — O09291 Supervision of pregnancy with other poor reproductive or obstetric history, first trimester: Secondary | ICD-10-CM

## 2014-07-19 DIAGNOSIS — Z0184 Encounter for antibody response examination: Secondary | ICD-10-CM

## 2014-07-19 HISTORY — DX: Encounter for supervision of normal pregnancy, unspecified, first trimester: Z34.91

## 2014-07-19 LAB — POCT URINALYSIS DIPSTICK
Glucose, UA: NEGATIVE
Ketones, UA: NEGATIVE
LEUKOCYTES UA: NEGATIVE
NITRITE UA: NEGATIVE
PROTEIN UA: NEGATIVE

## 2014-07-19 NOTE — Patient Instructions (Signed)
First Trimester of Pregnancy The first trimester of pregnancy is from week 1 until the end of week 12 (months 1 through 3). A week after a sperm fertilizes an egg, the egg will implant on the wall of the uterus. This embryo will begin to develop into a baby. Genes from you and your partner are forming the baby. The female genes determine whether the baby is a boy or a girl. At 6-8 weeks, the eyes and face are formed, and the heartbeat can be seen on ultrasound. At the end of 12 weeks, all the baby's organs are formed.  Now that you are pregnant, you will want to do everything you can to have a healthy baby. Two of the most important things are to get good prenatal care and to follow your health care provider's instructions. Prenatal care is all the medical care you receive before the baby's birth. This care will help prevent, find, and treat any problems during the pregnancy and childbirth. BODY CHANGES Your body goes through many changes during pregnancy. The changes vary from woman to woman.   You may gain or lose a couple of pounds at first.  You may feel sick to your stomach (nauseous) and throw up (vomit). If the vomiting is uncontrollable, call your health care provider.  You may tire easily.  You may develop headaches that can be relieved by medicines approved by your health care provider.  You may urinate more often. Painful urination may mean you have a bladder infection.  You may develop heartburn as a result of your pregnancy.  You may develop constipation because certain hormones are causing the muscles that push waste through your intestines to slow down.  You may develop hemorrhoids or swollen, bulging veins (varicose veins).  Your breasts may begin to grow larger and become tender. Your nipples may stick out more, and the tissue that surrounds them (areola) may become darker.  Your gums may bleed and may be sensitive to brushing and flossing.  Dark spots or blotches (chloasma,  mask of pregnancy) may develop on your face. This will likely fade after the baby is born.  Your menstrual periods will stop.  You may have a loss of appetite.  You may develop cravings for certain kinds of food.  You may have changes in your emotions from day to day, such as being excited to be pregnant or being concerned that something may go wrong with the pregnancy and baby.  You may have more vivid and strange dreams.  You may have changes in your hair. These can include thickening of your hair, rapid growth, and changes in texture. Some women also have hair loss during or after pregnancy, or hair that feels dry or thin. Your hair will most likely return to normal after your baby is born. WHAT TO EXPECT AT YOUR PRENATAL VISITS During a routine prenatal visit:  You will be weighed to make sure you and the baby are growing normally.  Your blood pressure will be taken.  Your abdomen will be measured to track your baby's growth.  The fetal heartbeat will be listened to starting around week 10 or 12 of your pregnancy.  Test results from any previous visits will be discussed. Your health care provider may ask you:  How you are feeling.  If you are feeling the baby move.  If you have had any abnormal symptoms, such as leaking fluid, bleeding, severe headaches, or abdominal cramping.  If you have any questions. Other tests   that may be performed during your first trimester include:  Blood tests to find your blood type and to check for the presence of any previous infections. They will also be used to check for low iron levels (anemia) and Rh antibodies. Later in the pregnancy, blood tests for diabetes will be done along with other tests if problems develop.  Urine tests to check for infections, diabetes, or protein in the urine.  An ultrasound to confirm the proper growth and development of the baby.  An amniocentesis to check for possible genetic problems.  Fetal screens for  spina bifida and Down syndrome.  You may need other tests to make sure you and the baby are doing well. HOME CARE INSTRUCTIONS  Medicines  Follow your health care provider's instructions regarding medicine use. Specific medicines may be either safe or unsafe to take during pregnancy.  Take your prenatal vitamins as directed.  If you develop constipation, try taking a stool softener if your health care provider approves. Diet  Eat regular, well-balanced meals. Choose a variety of foods, such as meat or vegetable-based protein, fish, milk and low-fat dairy products, vegetables, fruits, and whole grain breads and cereals. Your health care provider will help you determine the amount of weight gain that is right for you.  Avoid raw meat and uncooked cheese. These carry germs that can cause birth defects in the baby.  Eating four or five small meals rather than three large meals a day may help relieve nausea and vomiting. If you start to feel nauseous, eating a few soda crackers can be helpful. Drinking liquids between meals instead of during meals also seems to help nausea and vomiting.  If you develop constipation, eat more high-fiber foods, such as fresh vegetables or fruit and whole grains. Drink enough fluids to keep your urine clear or pale yellow. Activity and Exercise  Exercise only as directed by your health care provider. Exercising will help you:  Control your weight.  Stay in shape.  Be prepared for labor and delivery.  Experiencing pain or cramping in the lower abdomen or low back is a good sign that you should stop exercising. Check with your health care provider before continuing normal exercises.  Try to avoid standing for long periods of time. Move your legs often if you must stand in one place for a long time.  Avoid heavy lifting.  Wear low-heeled shoes, and practice good posture.  You may continue to have sex unless your health care provider directs you  otherwise. Relief of Pain or Discomfort  Wear a good support bra for breast tenderness.   Take warm sitz baths to soothe any pain or discomfort caused by hemorrhoids. Use hemorrhoid cream if your health care provider approves.   Rest with your legs elevated if you have leg cramps or low back pain.  If you develop varicose veins in your legs, wear support hose. Elevate your feet for 15 minutes, 3-4 times a day. Limit salt in your diet. Prenatal Care  Schedule your prenatal visits by the twelfth week of pregnancy. They are usually scheduled monthly at first, then more often in the last 2 months before delivery.  Write down your questions. Take them to your prenatal visits.  Keep all your prenatal visits as directed by your health care provider. Safety  Wear your seat belt at all times when driving.  Make a list of emergency phone numbers, including numbers for family, friends, the hospital, and police and fire departments. General Tips    Ask your health care provider for a referral to a local prenatal education class. Begin classes no later than at the beginning of month 6 of your pregnancy.  Ask for help if you have counseling or nutritional needs during pregnancy. Your health care provider can offer advice or refer you to specialists for help with various needs.  Do not use hot tubs, steam rooms, or saunas.  Do not douche or use tampons or scented sanitary pads.  Do not cross your legs for long periods of time.  Avoid cat litter boxes and soil used by cats. These carry germs that can cause birth defects in the baby and possibly loss of the fetus by miscarriage or stillbirth.  Avoid all smoking, herbs, alcohol, and medicines not prescribed by your health care provider. Chemicals in these affect the formation and growth of the baby.  Schedule a dentist appointment. At home, brush your teeth with a soft toothbrush and be gentle when you floss. SEEK MEDICAL CARE IF:   You have  dizziness.  You have mild pelvic cramps, pelvic pressure, or nagging pain in the abdominal area.  You have persistent nausea, vomiting, or diarrhea.  You have a bad smelling vaginal discharge.  You have pain with urination.  You notice increased swelling in your face, hands, legs, or ankles. SEEK IMMEDIATE MEDICAL CARE IF:   You have a fever.  You are leaking fluid from your vagina.  You have spotting or bleeding from your vagina.  You have severe abdominal cramping or pain.  You have rapid weight gain or loss.  You vomit blood or material that looks like coffee grounds.  You are exposed to [place] measles and have never had them.  You are exposed to fifth disease or chickenpox.  You develop a severe headache.  You have shortness of breath.  You have any kind of trauma, such as from a fall or a car accident. Document Released: 07/30/2001 Document Revised: 12/20/2013 Document Reviewed: 06/15/2013 [facility] LLCExitCare Patient Information 2015 [place], [place]. This information is not intended to replace advice given to you by your health care provider. Make sure you discuss any questions you have with your health care provider. Return in 4 weeks for IT/NT and see KIM

## 2014-07-19 NOTE — Progress Notes (Signed)
U/S(7+0wks)-single IUP with +FCA noted, FHR-143 bpm, CRL c/w LMP dates, cx apppears closed, posterior uterine fibroid=1.7cm, bilateral adnexa appears WNL with C.L. Noted on LT

## 2014-07-19 NOTE — Progress Notes (Signed)
Leg cramps. Hands and feet get cold at night.

## 2014-07-19 NOTE — Progress Notes (Signed)
Subjective:  Tina ReddenJennifer D Weber is a 33 y.o. [id number] Caucasian female at 73547w0d by LMP/US being seen today for her first obstetrical visit.  Her obstetrical history is significant for uses xanax, but trying to wean and is on wellbutrin.  Pregnancy history fully reviewed.  Patient reports nausea, and constipation, and some cramping, no bleeding, had brown discharge several days ago.. Denies vb, uti s/s, abnormal/malodorous vag d/c, has itching but may be due to shaving  BP 110/80 mmHg  Wt 143 lb (64.864 kg)  LMP 05/31/2014  HISTORY: OB History  Gravida Para Term Preterm AB SAB TAB Ectopic Multiple Living  2    1 1         # Outcome Date GA Lbr Len/2nd Weight Sex Delivery Anes PTL Lv  2 Current           1 SAB              Comments: System Generated. Please review and update pregnancy details.     Past Medical History  Diagnosis Date  . Seizures     Last seizure almost one year ago  . Depression   . Supervision of normal pregnancy in first trimester 07/19/2014     [facility] Hortense RamalJonathan Hestand 33 yo wm 1st Dating By LMP and US Pap  07/19/14 GC/CT Initial:                36+wks: Genetic Screen NT/IT:  CF screen  Anatomic US  Flu vaccine  Tdap Recommended ~ 28wks Glucose Screen  2 hr GBS  Feed Preference  Contraception  Circumcision  Childbirth Classes  Pediatrician     Past Surgical History  Procedure Laterality Date  . Wisdom tooth extraction    . Cholecystectomy     Family History  Problem Relation Age of Onset  . Congestive Heart Failure Maternal Grandmother   . Diabetes Maternal Grandfather   . Hypertension Father   . Cancer Mother     thyroid    Exam   System:     General: Well developed & nourished, no acute distress   Skin: Warm & dry, normal coloration and turgor, no rashes   Neurologic: Alert & oriented, normal mood   Cardiovascular: Regular rate & rhythm   Respiratory: Effort & rate normal, LCTAB, acyanotic   Abdomen: Soft, non tender   Extremities:  normal strength, tone   Pelvic Exam:    Perineum: Normal perineum   Vulva: Normal, no lesions   Vagina:  Normal mucosa, normal discharge   Cervix: Normal, bulbous, appears closed   Uterus: Normal size/shape/contour for GA   Thin prep pap smear with HPV and GC/CHL performed FHR: 143 via US   Assessment:   Pregnancy: G2P0010 Patient Active Problem List   Diagnosis Date Noted  . Supervision of normal pregnancy in first trimester 07/19/2014  . Depression 04/17/2014  . Generalized anxiety disorder 04/17/2014  . Insomnia 07/18/2013    [id number] G2P0010 New OB visit     Plan:  Initial labs drawn Continue prenatal vitamins Problem list reviewed and updated Reviewed n/v relief measures and warning s/s to report Reviewed recommended weight gain based on pre-gravid BMI Encouraged well-balanced diet Genetic Screening discussed Integrated Screen: requested Cystic fibrosis screening discussed requested Ultrasound discussed; fetal survey: requested  Increase water and eat often, try more fruit. Follow up in 4 weeks for IT/NT and see KIM  Adline PotterJennifer A. Satara Virella, NP 07/19/2014 3:39 PM

## 2014-07-20 ENCOUNTER — Telehealth: Payer: Self-pay | Admitting: *Deleted

## 2014-07-20 LAB — CBC
HCT: 36.2 % (ref 36.0–46.0)
Hemoglobin: 12.1 g/dL (ref 12.0–15.0)
MCH: 28.6 pg (ref 26.0–34.0)
MCHC: 33.4 g/dL (ref 30.0–36.0)
MCV: 85.6 fL (ref 78.0–100.0)
MPV: 9.2 fL — ABNORMAL LOW (ref 9.4–12.4)
PLATELETS: 250 10*3/uL (ref 150–400)
RBC: 4.23 MIL/uL (ref 3.87–5.11)
RDW: 12.9 % (ref 11.5–15.5)
WBC: 9.1 10*3/uL (ref 4.0–10.5)

## 2014-07-20 LAB — URINALYSIS
Bilirubin Urine: NEGATIVE
Glucose, UA: NEGATIVE mg/dL
HGB URINE DIPSTICK: NEGATIVE
Ketones, ur: NEGATIVE mg/dL
LEUKOCYTES UA: NEGATIVE
NITRITE: NEGATIVE
PROTEIN: NEGATIVE mg/dL
Specific Gravity, Urine: 1.02 (ref 1.005–1.030)
Urobilinogen, UA: 0.2 mg/dL (ref 0.0–1.0)
pH: 6 (ref 5.0–8.0)

## 2014-07-20 LAB — URINE CULTURE
Colony Count: NO GROWTH
Organism ID, Bacteria: NO GROWTH

## 2014-07-20 LAB — DRUG SCREEN, URINE, NO CONFIRMATION
Amphetamine Screen, Ur: NEGATIVE
BARBITURATE QUANT UR: NEGATIVE
Benzodiazepines.: NEGATIVE
COCAINE METABOLITES: NEGATIVE
Creatinine,U: 114.3 mg/dL
MARIJUANA METABOLITE: NEGATIVE
Methadone: NEGATIVE
Opiate Screen, Urine: NEGATIVE
PHENCYCLIDINE (PCP): NEGATIVE
Propoxyphene: NEGATIVE

## 2014-07-20 LAB — HEPATITIS B SURFACE ANTIGEN: Hepatitis B Surface Ag: NEGATIVE

## 2014-07-20 LAB — ABO AND RH: Rh Type: POSITIVE

## 2014-07-20 LAB — TSH: TSH: 0.674 u[IU]/mL (ref 0.350–4.500)

## 2014-07-20 LAB — VARICELLA ZOSTER ANTIBODY, IGG: VARICELLA IGG: 689 {index} — AB (ref <135.00)

## 2014-07-20 LAB — CYSTIC FIBROSIS DIAGNOSTIC STUDY

## 2014-07-20 LAB — RUBELLA SCREEN: RUBELLA: 0.51 {index} (ref <0.90)

## 2014-07-20 LAB — OXYCODONE SCREEN, UA, RFLX CONFIRM: Oxycodone Screen, Ur: NEGATIVE ng/mL

## 2014-07-20 LAB — HIV ANTIBODY (ROUTINE TESTING W REFLEX): HIV 1&2 Ab, 4th Generation: NONREACTIVE

## 2014-07-20 LAB — RPR

## 2014-07-20 LAB — ANTIBODY SCREEN: Antibody Screen: NEGATIVE

## 2014-07-20 NOTE — Telephone Encounter (Signed)
Pt states was seen yesterday for initial OB visit, was told everything was good, did have brownish discharge a few days before her appt, asking if she can have intercourse. Per Joellyn HaffKim Booker, CNM pt needs to wait at least a week from seeing in discharge with color before having sexual intercourse.

## 2014-07-21 ENCOUNTER — Encounter: Payer: Self-pay | Admitting: Adult Health

## 2014-07-21 LAB — CYTOLOGY - PAP

## 2014-07-25 ENCOUNTER — Encounter: Payer: Self-pay | Admitting: Adult Health

## 2014-07-25 ENCOUNTER — Telehealth: Payer: Self-pay | Admitting: Adult Health

## 2014-07-25 DIAGNOSIS — IMO0002 Reserved for concepts with insufficient information to code with codable children: Secondary | ICD-10-CM | POA: Insufficient documentation

## 2014-07-25 NOTE — Telephone Encounter (Signed)
Left message pap negative, GC/CHL negative but +HPV, will repeat pap next year when not pregnant

## 2014-07-26 ENCOUNTER — Telehealth: Payer: Self-pay | Admitting: Women's Health

## 2014-07-26 NOTE — Telephone Encounter (Signed)
Pt states that for past couple of days has had some lower back pain and cramping and now feels a hard knott on lower lt side of back, pt denies any vaginal bleeding or abdominal cramping.  Advised pt to keep an eye on it for a day or two, use Tylenol for pain, push fluids and massage area occasionally.  If any changes, gets worse, develops a fever or any vaginal bleeding call us back and we will bring her in to be seen.  Pt verbalized understanding.

## 2014-08-01 ENCOUNTER — Ambulatory Visit (INDEPENDENT_AMBULATORY_CARE_PROVIDER_SITE_OTHER): Payer: BC Managed Care – PPO | Admitting: Obstetrics and Gynecology

## 2014-08-01 ENCOUNTER — Encounter: Payer: Self-pay | Admitting: Obstetrics and Gynecology

## 2014-08-01 VITALS — BP 118/76 | Wt 141.0 lb

## 2014-08-01 DIAGNOSIS — Z1389 Encounter for screening for other disorder: Secondary | ICD-10-CM

## 2014-08-01 DIAGNOSIS — Z3481 Encounter for supervision of other normal pregnancy, first trimester: Secondary | ICD-10-CM

## 2014-08-01 DIAGNOSIS — Z331 Pregnant state, incidental: Secondary | ICD-10-CM

## 2014-08-01 DIAGNOSIS — M7918 Myalgia, other site: Secondary | ICD-10-CM

## 2014-08-01 LAB — POCT URINALYSIS DIPSTICK
Blood, UA: NEGATIVE
Glucose, UA: NEGATIVE
Ketones, UA: NEGATIVE
LEUKOCYTES UA: NEGATIVE
Nitrite, UA: NEGATIVE
Protein, UA: NEGATIVE

## 2014-08-01 NOTE — Progress Notes (Signed)
Pt states that she has been worked in today for a "knot" on her left lower back. Pt states that she has had this for about a week but the knot has gotten worse. Pt states that she can't sleep at all.

## 2014-08-01 NOTE — Progress Notes (Signed)
G2P0010 4411w6d Estimated Date of Delivery: 03/07/15  Blood pressure 118/76, weight 63.957 kg (141 lb), last menstrual period 05/31/2014.   refer to the ob flow sheet for FH and FHR, also BP, Wt, Urine results:notable for neg.  Patient reports  No fetal movement, denies any bleeding and no rupture of membranes symptoms or regular contractions. Patient complaints:muscle spasms over posterior superior .iliac crest on left. Exam musculoskeletal discomforts  Questions were answered. Assessment:  Plan:  Continued routine obstetrical care, local heat massage,  F/u prn  F/u in as sched weeks for ob visit

## 2014-08-03 ENCOUNTER — Telehealth: Payer: Self-pay | Admitting: Family Medicine

## 2014-08-03 NOTE — Telephone Encounter (Signed)
Pt takes a low dose Xanax daily, she is [redacted] weeks pregnant  Currently her OB is telling her she needs to ween herself off the  Xanax while pregnant but she is feeling extremely anxious.  She has cut down to 1/2 at bed time, she tries to take just a 1/4 of  A pill but she can't sleep an things get very bad.   She wants to know your opinion (2nd opinion) on her reducing and  Trying to come off the med while pregnant. She is unsure being so  Anxious is a good thing

## 2014-08-03 NOTE — Telephone Encounter (Signed)
Ntsw. Unfortunately, xanax associated with sligh t incr risk of congenital problems. Well butrin considered safe. Should wean off xanax as advised. When she gets to one quarter tab, may add 25 mg benadryl nightly and mainatian this after stopping xanax.

## 2014-08-04 NOTE — Telephone Encounter (Signed)
Results discussed with patient. Patient advised Unfortunately, xanax associated with slight increase risk of congenital problems. Well butrin considered safe. Should wean off xanax as advised. When she gets to one quarter tab, may add 25 mg benadryl nightly and mainatian this after stopping xanax. Patient verbalized understanding.

## 2014-08-04 NOTE — Telephone Encounter (Signed)
Left message to return call 

## 2014-08-17 ENCOUNTER — Ambulatory Visit (INDEPENDENT_AMBULATORY_CARE_PROVIDER_SITE_OTHER): Payer: BC Managed Care – PPO

## 2014-08-17 ENCOUNTER — Ambulatory Visit (INDEPENDENT_AMBULATORY_CARE_PROVIDER_SITE_OTHER): Payer: BC Managed Care – PPO | Admitting: Obstetrics and Gynecology

## 2014-08-17 ENCOUNTER — Encounter: Payer: Self-pay | Admitting: Obstetrics and Gynecology

## 2014-08-17 ENCOUNTER — Other Ambulatory Visit: Payer: Self-pay | Admitting: Adult Health

## 2014-08-17 VITALS — BP 110/60 | Wt 143.5 lb

## 2014-08-17 DIAGNOSIS — Z3491 Encounter for supervision of normal pregnancy, unspecified, first trimester: Secondary | ICD-10-CM

## 2014-08-17 DIAGNOSIS — Z3682 Encounter for antenatal screening for nuchal translucency: Secondary | ICD-10-CM

## 2014-08-17 DIAGNOSIS — Z331 Pregnant state, incidental: Secondary | ICD-10-CM

## 2014-08-17 DIAGNOSIS — Z3481 Encounter for supervision of other normal pregnancy, first trimester: Secondary | ICD-10-CM

## 2014-08-17 DIAGNOSIS — O09291 Supervision of pregnancy with other poor reproductive or obstetric history, first trimester: Secondary | ICD-10-CM

## 2014-08-17 DIAGNOSIS — Z36 Encounter for antenatal screening of mother: Secondary | ICD-10-CM

## 2014-08-17 DIAGNOSIS — Z1389 Encounter for screening for other disorder: Secondary | ICD-10-CM

## 2014-08-17 LAB — POCT URINALYSIS DIPSTICK
GLUCOSE UA: NEGATIVE
Ketones, UA: NEGATIVE
Leukocytes, UA: NEGATIVE
NITRITE UA: NEGATIVE
Protein, UA: NEGATIVE
RBC UA: NEGATIVE

## 2014-08-17 NOTE — Progress Notes (Signed)
U/S(11+1wks)single IUP with +FCA noted, active fetus, CRL c/w dates, anterior Gr 0 placenta, cx appears closed, bilateral adnexa appears WNl, fibroid=1.6cm, NB present, NT-1.341mm, FHR-171 bpm

## 2014-08-17 NOTE — Progress Notes (Signed)
G2P0010 3887w1d Estimated Date of Delivery: 03/07/15  Blood pressure 110/60, weight 143 lb 8 oz (65.091 kg), last menstrual period 05/31/2014.   refer to the ob flow sheet for FH and FHR, also BP, Wt, Urine results:notable for negative   Patient reports  Too early for good fetal movement, denies any bleeding and no rupture of membranes symptoms or regular contractions. 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 Assessment:  Plan:  Continued routine obstetrical care,   F/u in 4 wk  weeks for second IT, 8 wk for u/s

## 2014-08-17 NOTE — Progress Notes (Signed)
Pt denies any problems or concerns at this time.  

## 2014-08-19 NOTE — L&D Delivery Note (Cosign Needed)
Delivery Note At 10:33 PM a viable female was delivered via Vaginal, Spontaneous Delivery (Presentation: Right Occiput Anterior).  APGAR: 8, 9; weight  pending.   Placenta status: Intact, Spontaneous.  Cord: 3 vessels with the following complications: None.  Cord pH: n/a  Anesthesia: Epidural  Episiotomy: None Lacerations:  1st degree parenial Suture Repair: 2.0 vicryl Est. Blood Loss (mL):  68  Mom to postpartum.  Baby to Couplet care / Skin to Skin.  Erasmo DownerAngela M Bacigalupo, MD, MPH PGY-2,  [facility] 02/26/2015 10:57 PM

## 2014-08-23 LAB — MATERNAL SCREEN, INTEGRATED #1

## 2014-08-31 ENCOUNTER — Ambulatory Visit (INDEPENDENT_AMBULATORY_CARE_PROVIDER_SITE_OTHER): Payer: BLUE CROSS/BLUE SHIELD | Admitting: Women's Health

## 2014-08-31 ENCOUNTER — Telehealth: Payer: Self-pay | Admitting: Women's Health

## 2014-08-31 ENCOUNTER — Encounter: Payer: Self-pay | Admitting: Women's Health

## 2014-08-31 ENCOUNTER — Telehealth: Payer: Self-pay | Admitting: *Deleted

## 2014-08-31 VITALS — BP 120/82 | Wt 142.5 lb

## 2014-08-31 DIAGNOSIS — Z3481 Encounter for supervision of other normal pregnancy, first trimester: Secondary | ICD-10-CM

## 2014-08-31 DIAGNOSIS — O26891 Other specified pregnancy related conditions, first trimester: Secondary | ICD-10-CM

## 2014-08-31 DIAGNOSIS — Z331 Pregnant state, incidental: Secondary | ICD-10-CM

## 2014-08-31 DIAGNOSIS — N949 Unspecified condition associated with female genital organs and menstrual cycle: Secondary | ICD-10-CM

## 2014-08-31 DIAGNOSIS — N898 Other specified noninflammatory disorders of vagina: Secondary | ICD-10-CM

## 2014-08-31 DIAGNOSIS — Z1389 Encounter for screening for other disorder: Secondary | ICD-10-CM

## 2014-08-31 DIAGNOSIS — O26899 Other specified pregnancy related conditions, unspecified trimester: Secondary | ICD-10-CM

## 2014-08-31 DIAGNOSIS — R102 Pelvic and perineal pain: Secondary | ICD-10-CM

## 2014-08-31 DIAGNOSIS — O9989 Other specified diseases and conditions complicating pregnancy, childbirth and the puerperium: Secondary | ICD-10-CM

## 2014-08-31 LAB — POCT WET PREP (WET MOUNT): CLUE CELLS WET PREP WHIFF POC: NEGATIVE

## 2014-08-31 LAB — POCT URINALYSIS DIPSTICK
Blood, UA: NEGATIVE
GLUCOSE UA: NEGATIVE
LEUKOCYTES UA: NEGATIVE
NITRITE UA: NEGATIVE
Protein, UA: NEGATIVE

## 2014-08-31 NOTE — Patient Instructions (Signed)
Nausea & Vomiting  Have saltine crackers or pretzels by your bed and eat a few bites before you raise your head out of bed in the morning  Eat small frequent meals throughout the day instead of large meals  Drink plenty of fluids throughout the day to stay hydrated, just don't drink a lot of fluids with your meals.  This can make your stomach fill up faster making you feel sick  Do not brush your teeth right after you eat  Products with real ginger are good for nausea, like ginger ale and ginger hard candy Make sure it says made with real ginger!  Sucking on sour candy like lemon heads is also good for nausea  If your prenatal vitamins make you nauseated, take them at night so you will sleep through the nausea  Sea Bands  If you feel like you need medicine for the nausea & vomiting please let us know  If you are unable to keep any fluids or food down please let us know    

## 2014-08-31 NOTE — Progress Notes (Signed)
Work-in Low-risk OB appointment G2P0010 1842w1d Estimated Date of Delivery: 03/07/15 BP 136/98 mmHg  Wt 142 lb 8 oz (64.638 kg)  LMP 05/31/2014  BP, weight, and urine reviewed.  Refer to obstetrical flow sheet for FH & FHR.  No fm yet.  Denies vb, or uti s/s. Lt RLP- discussed prevention measures. Underwear feels wet throughout the day x 2 days. No odor/itching/irritation.  SSE: cx visually closed, small amount creamy white nonodorous d/c, wet prep neg, fern neg, no pooling of fluid Reviewed warning s/s to report. To increase fluids (ketonuria) Plan:  Continue routine obstetrical care  F/U as scheduled for OB appointment

## 2014-08-31 NOTE — Telephone Encounter (Signed)
Pt called with complaints of cramping in left side, and a sharp pain when she bends over. No bleeding. Pt states she feels like she's leaking fluid. I advised she would need to be seen. Pt voiced understanding. Call transferred to front desk for appt. JSY

## 2014-09-14 ENCOUNTER — Ambulatory Visit (INDEPENDENT_AMBULATORY_CARE_PROVIDER_SITE_OTHER): Payer: BLUE CROSS/BLUE SHIELD | Admitting: Advanced Practice Midwife

## 2014-09-14 VITALS — BP 130/90 | Wt 145.0 lb

## 2014-09-14 DIAGNOSIS — Z3482 Encounter for supervision of other normal pregnancy, second trimester: Secondary | ICD-10-CM

## 2014-09-14 DIAGNOSIS — Z363 Encounter for antenatal screening for malformations: Secondary | ICD-10-CM

## 2014-09-14 DIAGNOSIS — Z1389 Encounter for screening for other disorder: Secondary | ICD-10-CM

## 2014-09-14 DIAGNOSIS — Z331 Pregnant state, incidental: Secondary | ICD-10-CM

## 2014-09-14 LAB — POCT URINALYSIS DIPSTICK
Blood, UA: NEGATIVE
Glucose, UA: NEGATIVE
Ketones, UA: NEGATIVE
LEUKOCYTES UA: NEGATIVE
Nitrite, UA: NEGATIVE
PROTEIN UA: NEGATIVE

## 2014-09-14 MED ORDER — BUPROPION HCL ER (SR) 150 MG PO TB12: 150.0000 mg | ORAL_TABLET | Freq: Every day | ORAL | Status: DC

## 2014-09-14 NOTE — Progress Notes (Signed)
G2P0010 5029w1d Estimated Date of Delivery: 03/07/15  Last menstrual period 05/31/2014.   BP weight and urine results all reviewed and noted.  Please refer to the obstetrical flow sheet for the fundal height and fetal heart rate documentation:  Patient denies any bleeding and no rupture of membranes symptoms or regular contractions. Patient is without complaints. All questions were answered.  Plan:  Continued routine obstetrical care, 2nd IT today  Follow up in 4 weeks for OB appointment, anatomy scan

## 2014-09-14 NOTE — Addendum Note (Signed)
Addended by: Richardson ChiquitoRAVIS, Kelsy Polack M on: 09/14/2014 04:23 PM   Modules accepted: Orders

## 2014-09-14 NOTE — Patient Instructions (Signed)

## 2014-09-19 ENCOUNTER — Telehealth: Payer: Self-pay | Admitting: Advanced Practice Midwife

## 2014-09-19 NOTE — Telephone Encounter (Signed)
Pt states saw Rodena PietyFran Cresenzo, CNM on 09/14/2014 was offered a medication for muscle spasm but pt refused. Pt states back pain has got worse and would like the muscle relaxer to be prescribed. Pt informed Drenda FreezeFran out of office until Wednesday would route the message. Pt verbalized understanding.

## 2014-09-20 LAB — MATERNAL SCREEN, INTEGRATED #2
AFP MoM: 0.82
AFP, SERUM MAT SCREEN: 25.2 ng/mL
Age risk Down Syndrome: 1:520 {titer}
CALCULATED GESTATIONAL AGE MAT SCREEN: 15.4
CROWN RUMP LENGTH MAT SCREEN 2: 49.8 mm
Estriol Mom: 0.79
Estriol, Free: 0.56 ng/mL
HCG, MOM MAT SCREEN: 0.57
Inhibin A Dimeric: 116 pg/mL
Inhibin A MoM: 0.64
MSS Trisomy 18 Risk: <1:5000 {titer}
NT MOM MAT SCREEN: 1.15
Nuchal Translucency: 1.41 mm
Number of fetuses: 1
PAPP-A MoM: 0.89
PAPP-A: 443 ng/mL
Rish for ONTD: <1:5000 {titer}
hCG, Serum: 23.4 IU/mL

## 2014-09-21 MED ORDER — CYCLOBENZAPRINE HCL 10 MG PO TABS: 10.0000 mg | ORAL_TABLET | Freq: Three times a day (TID) | ORAL | Status: DC | PRN

## 2014-09-21 NOTE — Telephone Encounter (Signed)
Flexeril 10mg  TID prn # 30 1 RF

## 2014-09-26 ENCOUNTER — Ambulatory Visit: Payer: BC Managed Care – PPO | Admitting: Family Medicine

## 2014-10-13 ENCOUNTER — Ambulatory Visit (INDEPENDENT_AMBULATORY_CARE_PROVIDER_SITE_OTHER): Payer: BLUE CROSS/BLUE SHIELD | Admitting: Obstetrics & Gynecology

## 2014-10-13 ENCOUNTER — Encounter: Payer: Self-pay | Admitting: Obstetrics & Gynecology

## 2014-10-13 ENCOUNTER — Other Ambulatory Visit: Payer: Self-pay | Admitting: Advanced Practice Midwife

## 2014-10-13 ENCOUNTER — Ambulatory Visit (INDEPENDENT_AMBULATORY_CARE_PROVIDER_SITE_OTHER): Payer: BLUE CROSS/BLUE SHIELD

## 2014-10-13 VITALS — BP 120/80 | HR 84 | Wt 150.0 lb

## 2014-10-13 DIAGNOSIS — Z3482 Encounter for supervision of other normal pregnancy, second trimester: Secondary | ICD-10-CM

## 2014-10-13 DIAGNOSIS — Z1389 Encounter for screening for other disorder: Secondary | ICD-10-CM

## 2014-10-13 DIAGNOSIS — O09292 Supervision of pregnancy with other poor reproductive or obstetric history, second trimester: Secondary | ICD-10-CM

## 2014-10-13 DIAGNOSIS — Z36 Encounter for antenatal screening of mother: Secondary | ICD-10-CM

## 2014-10-13 DIAGNOSIS — Z331 Pregnant state, incidental: Secondary | ICD-10-CM

## 2014-10-13 DIAGNOSIS — Z363 Encounter for antenatal screening for malformations: Secondary | ICD-10-CM

## 2014-10-13 LAB — POCT URINALYSIS DIPSTICK
GLUCOSE UA: NEGATIVE
Ketones, UA: NEGATIVE
Leukocytes, UA: NEGATIVE
NITRITE UA: NEGATIVE
Protein, UA: NEGATIVE
RBC UA: NEGATIVE

## 2014-10-13 NOTE — Progress Notes (Signed)
U/S(19+2wks)-single active fetus, meas c/w dates, cx appears closed (3.1cm), anterior Gr 0 placenta, bilateral adnexa appears, FHR-153 bpm, female fetus, no major abnl noted

## 2014-10-13 NOTE — Progress Notes (Signed)
Sonogram is reviewed read and report is done, all normal  G2P0010 3248w2d Estimated Date of Delivery: 03/07/15  Blood pressure 120/80, pulse 84, weight 150 lb (68.04 kg), last menstrual period 05/31/2014.   BP weight and urine results all reviewed and noted.  Please refer to the obstetrical flow sheet for the fundal height and fetal heart rate documentation:  Patient reports good fetal movement, denies any bleeding and no rupture of membranes symptoms or regular contractions. Patient is without complaints. All questions were answered.  Plan:  Continued routine obstetrical care,   Follow up in 4 weeks for OB appointment,

## 2014-11-07 ENCOUNTER — Telehealth: Payer: Self-pay | Admitting: *Deleted

## 2014-11-07 ENCOUNTER — Inpatient Hospital Stay (HOSPITAL_COMMUNITY)
Admission: AD | Admit: 2014-11-07 | Discharge: 2014-11-07 | Disposition: A | Discharge status: Home / Self Care | Payer: BLUE CROSS/BLUE SHIELD | Source: Ambulatory Visit | Attending: Obstetrics & Gynecology | Admitting: Obstetrics & Gynecology

## 2014-11-07 ENCOUNTER — Encounter (HOSPITAL_COMMUNITY): Payer: Self-pay | Admitting: *Deleted

## 2014-11-07 DIAGNOSIS — R0602 Shortness of breath: Secondary | ICD-10-CM | POA: Diagnosis not present

## 2014-11-07 DIAGNOSIS — Z3A22 22 weeks gestation of pregnancy: Secondary | ICD-10-CM | POA: Diagnosis not present

## 2014-11-07 DIAGNOSIS — O10012 Pre-existing essential hypertension complicating pregnancy, second trimester: Secondary | ICD-10-CM

## 2014-11-07 DIAGNOSIS — R030 Elevated blood-pressure reading, without diagnosis of hypertension: Secondary | ICD-10-CM | POA: Diagnosis present

## 2014-11-07 DIAGNOSIS — O162 Unspecified maternal hypertension, second trimester: Secondary | ICD-10-CM

## 2014-11-07 DIAGNOSIS — R0600 Dyspnea, unspecified: Secondary | ICD-10-CM

## 2014-11-07 DIAGNOSIS — O26892 Other specified pregnancy related conditions, second trimester: Secondary | ICD-10-CM

## 2014-11-07 DIAGNOSIS — Z3492 Encounter for supervision of normal pregnancy, unspecified, second trimester: Secondary | ICD-10-CM

## 2014-11-07 LAB — URINALYSIS, ROUTINE W REFLEX MICROSCOPIC
Bilirubin Urine: NEGATIVE
Glucose, UA: NEGATIVE mg/dL
HGB URINE DIPSTICK: NEGATIVE
Ketones, ur: NEGATIVE mg/dL
Leukocytes, UA: NEGATIVE
Nitrite: NEGATIVE
PH: 5.5 (ref 5.0–8.0)
Protein, ur: NEGATIVE mg/dL
SPECIFIC GRAVITY, URINE: 1.01 (ref 1.005–1.030)
UROBILINOGEN UA: 0.2 mg/dL (ref 0.0–1.0)

## 2014-11-07 LAB — COMPREHENSIVE METABOLIC PANEL
ALK PHOS: 72 U/L (ref 39–117)
ALT: 16 U/L (ref 0–35)
AST: 13 U/L (ref 0–37)
Albumin: 3.1 g/dL — ABNORMAL LOW (ref 3.5–5.2)
Anion gap: 9 (ref 5–15)
BUN: 6 mg/dL (ref 6–23)
CO2: 25 mmol/L (ref 19–32)
Calcium: 8.9 mg/dL (ref 8.4–10.5)
Chloride: 104 mmol/L (ref 96–112)
Creatinine, Ser: 0.41 mg/dL — ABNORMAL LOW (ref 0.50–1.10)
GFR calc non Af Amer: >90 mL/min (ref >90)
Glucose, Bld: 97 mg/dL (ref 70–99)
Potassium: 4.2 mmol/L (ref 3.5–5.1)
Sodium: 138 mmol/L (ref 135–145)
TOTAL PROTEIN: 6.2 g/dL (ref 6.0–8.3)
Total Bilirubin: 0.3 mg/dL (ref 0.3–1.2)

## 2014-11-07 LAB — CBC WITH DIFFERENTIAL/PLATELET
BASOS PCT: 0 % (ref 0–1)
Basophils Absolute: 0 10*3/uL (ref 0.0–0.1)
EOS PCT: 1 % (ref 0–5)
Eosinophils Absolute: 0.1 10*3/uL (ref 0.0–0.7)
HCT: 32.5 % — ABNORMAL LOW (ref 36.0–46.0)
Hemoglobin: 11 g/dL — ABNORMAL LOW (ref 12.0–15.0)
LYMPHS ABS: 1.4 10*3/uL (ref 0.7–4.0)
Lymphocytes Relative: 13 % (ref 12–46)
MCH: 29.5 pg (ref 26.0–34.0)
MCHC: 33.8 g/dL (ref 30.0–36.0)
MCV: 87.1 fL (ref 78.0–100.0)
Monocytes Absolute: 0.7 10*3/uL (ref 0.1–1.0)
Monocytes Relative: 7 % (ref 3–12)
NEUTROS PCT: 79 % — AB (ref 43–77)
Neutro Abs: 8.6 10*3/uL — ABNORMAL HIGH (ref 1.7–7.7)
PLATELETS: 214 10*3/uL (ref 150–400)
RBC: 3.73 MIL/uL — ABNORMAL LOW (ref 3.87–5.11)
RDW: 12.9 % (ref 11.5–15.5)
WBC: 10.8 10*3/uL — AB (ref 4.0–10.5)

## 2014-11-07 LAB — PROTEIN / CREATININE RATIO, URINE
Creatinine, Urine: 25 mg/dL
Total Protein, Urine: <6 mg/dL

## 2014-11-07 NOTE — Discharge Instructions (Signed)
Second Trimester of Pregnancy The second trimester is from week 13 through week 28, months 4 through 6. The second trimester is often a time when you feel your best. Your body has also adjusted to being pregnant, and you begin to feel better physically. Usually, morning sickness has lessened or quit completely, you may have more energy, and you may have an increase in appetite. The second trimester is also a time when the fetus is growing rapidly. At the end of the sixth month, the fetus is about 9 inches long and weighs about 1 pounds. You will likely begin to feel the baby move (quickening) between 18 and 20 weeks of the pregnancy. BODY CHANGES Your body goes through many changes during pregnancy. The changes vary from woman to woman.   Your weight will continue to increase. You will notice your lower abdomen bulging out.  You may begin to get stretch marks on your hips, abdomen, and breasts.  You may develop headaches that can be relieved by medicines approved by your health care provider.  You may urinate more often because the fetus is pressing on your bladder.  You may develop or continue to have heartburn as a result of your pregnancy.  You may develop constipation because certain hormones are causing the muscles that push waste through your intestines to slow down.  You may develop hemorrhoids or swollen, bulging veins (varicose veins).  You may have back pain because of the weight gain and pregnancy hormones relaxing your joints between the bones in your pelvis and as a result of a shift in weight and the muscles that support your balance.  Your breasts will continue to grow and be tender.  Your gums may bleed and may be sensitive to brushing and flossing.  Dark spots or blotches (chloasma, mask of pregnancy) may develop on your face. This will likely fade after the baby is born.  A dark line from your belly button to the pubic area (linea nigra) may appear. This will likely fade  after the baby is born.  You may have changes in your hair. These can include thickening of your hair, rapid growth, and changes in texture. Some women also have hair loss during or after pregnancy, or hair that feels dry or thin. Your hair will most likely return to normal after your baby is born. WHAT TO EXPECT AT YOUR PRENATAL VISITS During a routine prenatal visit:  You will be weighed to make sure you and the fetus are growing normally.  Your blood pressure will be taken.  Your abdomen will be measured to track your baby's growth.  The fetal heartbeat will be listened to.  Any test results from the previous visit will be discussed. Your health care provider may ask you:  How you are feeling.  If you are feeling the baby move.  If you have had any abnormal symptoms, such as leaking fluid, bleeding, severe headaches, or abdominal cramping.  If you have any questions. Other tests that may be performed during your second trimester include:  Blood tests that check for:  Low iron levels (anemia).  Gestational diabetes (between 24 and 28 weeks).  Rh antibodies.  Urine tests to check for infections, diabetes, or protein in the urine.  An ultrasound to confirm the proper growth and development of the baby.  An amniocentesis to check for possible genetic problems.  Fetal screens for spina bifida and Down syndrome. HOME CARE INSTRUCTIONS   Avoid all smoking, herbs, alcohol, and unprescribed   drugs. These chemicals affect the formation and growth of the baby.  Follow your health care provider's instructions regarding medicine use. There are medicines that are either safe or unsafe to take during pregnancy.  Exercise only as directed by your health care provider. Experiencing uterine cramps is a good sign to stop exercising.  Continue to eat regular, healthy meals.  Wear a good support bra for breast tenderness.  Do not use hot tubs, steam rooms, or saunas.  Wear your  seat belt at all times when driving.  Avoid raw meat, uncooked cheese, cat litter boxes, and soil used by cats. These carry germs that can cause birth defects in the baby.  Take your prenatal vitamins.  Try taking a stool softener (if your health care provider approves) if you develop constipation. Eat more high-fiber foods, such as fresh vegetables or fruit and whole grains. Drink plenty of fluids to keep your urine clear or pale yellow.  Take warm sitz baths to soothe any pain or discomfort caused by hemorrhoids. Use hemorrhoid cream if your health care provider approves.  If you develop varicose veins, wear support hose. Elevate your feet for 15 minutes, 3-4 times a day. Limit salt in your diet.  Avoid heavy lifting, wear low heel shoes, and practice good posture.  Rest with your legs elevated if you have leg cramps or low back pain.  Visit your dentist if you have not gone yet during your pregnancy. Use a soft toothbrush to brush your teeth and be gentle when you floss.  A sexual relationship may be continued unless your health care provider directs you otherwise.  Continue to go to all your prenatal visits as directed by your health care provider. SEEK MEDICAL CARE IF:   You have dizziness.  You have mild pelvic cramps, pelvic pressure, or nagging pain in the abdominal area.  You have persistent nausea, vomiting, or diarrhea.  You have a bad smelling vaginal discharge.  You have pain with urination. SEEK IMMEDIATE MEDICAL CARE IF:   You have a fever.  You are leaking fluid from your vagina.  You have spotting or bleeding from your vagina.  You have severe abdominal cramping or pain.  You have rapid weight gain or loss.  You have shortness of breath with chest pain.  You notice sudden or extreme swelling of your face, hands, ankles, feet, or legs.  You have not felt your baby move in over an hour.  You have severe headaches that do not go away with  medicine.  You have vision changes. Document Released: 07/30/2001 Document Revised: 08/10/2013 Document Reviewed: 10/06/2012 ExitCare Patient Information 2015 [employer]. This information is not intended to replace advice given to you by your health care provider. Make sure you discuss any questions you have with your health care provider.  

## 2014-11-07 NOTE — MAU Note (Signed)
Pt C/O sudden onset of SOB @ work today, BP was 135/92 @ 0800.  150/92 @ 1000.  Pt called MD office, instructed to come to MAU.  C/O slight HA, was seeing spots earlier, none now.  Denies uc's, bleeding or LOF.

## 2014-11-07 NOTE — Telephone Encounter (Signed)
Spoke with pt. Pt's BP was 136/93 on Friday. It was 142/100 today, then it was 150/102 today. Pt felt short of breath. [place] I advised pt [facility]. Pt voiced understanding. JSY

## 2014-11-07 NOTE — MAU Provider Note (Signed)
History     CSN: [id number]  Arrival date and time: 11/07/14 1125   None     Chief Complaint  Patient presents with  . Shortness of Breath  . Hypertension   HPI Patient is 35 y.o. G2P0010 [redacted]w[redacted]d with history of miscarriage, here with complaints of elevated BP.  Associated symptoms of chest heaviness, HA, lightheadedness. She reports having dark spots in her vision and edema in her legs 4 days ago. Vision changes lasted 30 minutes and edema resolved after a few hours. Reports chest heaviness 1 day ago while she was unloading the dishwasher. She immediately went to [facility] and measured her BP at 130's/90's. She felt chest heaviness again at work today along with HA and light-headedness with a self-measured BP of 150's/90's. Symptoms resolved within 30 minutes. She called her OB and was instructed to come to the MAU. Last episode of chest heaviness and dyspnea was several minutes ago. +FM, denies LOF, VB, contractions, vaginal discharge.Denies nausea, vomiting, abdominal pain, syncope.   Past Medical History  Diagnosis Date  . Seizures     Last seizure almost one year ago  . Depression   . Supervision of normal pregnancy in first trimester 07/19/2014     [facility] F[facility] kenadie royce 34 yo wm 1st Dating By LMP and [place] Pap  07/19/14 GC/CT Initial:                36+wks: Genetic Screen NT/IT:  CF screen  Anatomic [place]  Flu vaccine  Tdap Recommended ~ 28wks Glucose Screen  2 hr GBS  Feed Preference  Contraception  Circumcision  Childbirth Classes  Pediatrician      Past Surgical History  Procedure Laterality Date  . Wisdom tooth extraction    . Cholecystectomy      Family History  Problem Relation Age of Onset  . Congestive Heart Failure Maternal Grandmother   . Diabetes Maternal Grandfather   . Hypertension Father   . Cancer Mother     thyroid    History  Substance Use Topics  . Smoking status: Never Smoker   . Smokeless tobacco: Never Used  . Alcohol Use: No     Allergies: No Known Allergies  Prescriptions prior to admission  Medication Sig Dispense Refill Last Dose  . buPROPion (WELLBUTRIN SR) 150 MG 12 hr tablet Take 1 tablet (150 mg total) by mouth daily. 60 tablet 5 Taking  . cyclobenzaprine (FLEXERIL) 10 MG tablet Take 1 tablet (10 mg total) by mouth every 8 (eight) hours as needed for muscle spasms. 30 tablet 1 Taking  . Prenatal Vit-Fe Fumarate-FA (PRENATAL MULTIVITAMIN) TABS tablet Take 1 tablet by mouth daily at 12 noon.   Taking    Review of Systems  Constitutional: Negative for fever, chills, weight loss and malaise/fatigue.  Eyes: Negative for double vision, photophobia and pain.       Dark spots in vision  Respiratory: Negative for cough and wheezing.   Cardiovascular: Negative for palpitations.       Chest heaviness  Gastrointestinal: Negative for heartburn, nausea, vomiting, abdominal pain, diarrhea, constipation and blood in stool.  Genitourinary: Negative for dysuria, urgency, frequency and hematuria.  Musculoskeletal: Negative for myalgias.  Skin: Negative.   Neurological: Positive for headaches. Negative for tingling, sensory change, focal weakness, seizures, loss of consciousness and weakness.  Endo/Heme/Allergies: Negative.   Psychiatric/Behavioral: Negative.    Physical Exam   Blood pressure 152/107, pulse 145, temperature 98.3 F (36.8 C), temperature source Oral, resp. rate  20, last menstrual period 05/31/2014, SpO2 100 %.  Physical Exam  Constitutional: She is oriented to person, place, and time. She appears well-developed and well-nourished.  HENT:  Head: Normocephalic and atraumatic.  Eyes: Conjunctivae and EOM are normal. Pupils are equal, round, and reactive to light.  Neck: Normal range of motion. Neck supple.  Cardiovascular: Normal rate.   Respiratory: Effort normal and breath sounds normal.  GI: Soft. Bowel sounds are normal. She exhibits no mass. There is no tenderness.  Musculoskeletal: Normal  range of motion.  Lymphadenopathy:    She has no cervical adenopathy.  Neurological: She is alert and oriented to person, place, and time.  Skin: Skin is warm and dry.  Psychiatric: She has a normal mood and affect. Her behavior is normal.    MAU Course  Procedures  MDM FHT reassuring  Assessment and Plan  A: Patient is 34 y.o. G2P0010 8560w6d with history of miscarriage, here with complaints of elevated BP.      CBC, CMP, UA, Protein/Creatinine ratio wnl     Repeat BP wnl  P: Discharge to home with return precautions      Work modifications letter for pregnancy   Henson,Amber 11/07/2014, 12:59 PM   CNM attestation:  I have seen and examined this patient; I agree with above documentation in the resident's note.   Robet LeuJennifer D Arguijo is a 34 y.o. G2P0010 @[redacted]w[redacted]d  reporting visual change, feeling of chest heaviness and shortness of breath that is intermittent and associated with standing or activity, and elevated BP recorded at home. +FM, denies LOF, VB, contractions, vaginal discharge.  PE: BP 112/68 mmHg  Pulse 68  Temp(Src) 98.3 F (36.8 C) (Oral)  Resp 18  SpO2 100%  LMP 05/31/2014   Intake BP elevated, then normal BP x 3-4 while in MAU  Gen: calm comfortable, NAD HEART: normal rate, heart sounds, regular rhythm RESP: normal effort, lung sounds clear and equal bilaterally Abd: gravid +1 edema BLE  ROS, labs, PMH reviewed NST reactive   Plan: D/C home with preeclampsia precautions Reassurance provided about normal physiologic changes of pregnancy [facility] Return to MAU as needed for emergencies  LEFTWICH-KIRBY, Aylan Bayona, CNM 8:50 PM

## 2014-11-10 ENCOUNTER — Ambulatory Visit (INDEPENDENT_AMBULATORY_CARE_PROVIDER_SITE_OTHER): Payer: BLUE CROSS/BLUE SHIELD | Admitting: Advanced Practice Midwife

## 2014-11-10 ENCOUNTER — Encounter: Payer: Self-pay | Admitting: Advanced Practice Midwife

## 2014-11-10 VITALS — BP 138/104 | HR 110 | Wt 162.0 lb

## 2014-11-10 DIAGNOSIS — Z331 Pregnant state, incidental: Secondary | ICD-10-CM

## 2014-11-10 DIAGNOSIS — Z1389 Encounter for screening for other disorder: Secondary | ICD-10-CM

## 2014-11-10 DIAGNOSIS — O09892 Supervision of other high risk pregnancies, second trimester: Secondary | ICD-10-CM

## 2014-11-10 DIAGNOSIS — O132 Gestational [pregnancy-induced] hypertension without significant proteinuria, second trimester: Secondary | ICD-10-CM

## 2014-11-10 DIAGNOSIS — O10919 Unspecified pre-existing hypertension complicating pregnancy, unspecified trimester: Secondary | ICD-10-CM | POA: Insufficient documentation

## 2014-11-10 LAB — POCT URINALYSIS DIPSTICK
GLUCOSE UA: NEGATIVE
Ketones, UA: NEGATIVE
Leukocytes, UA: NEGATIVE
Nitrite, UA: NEGATIVE
Protein, UA: NEGATIVE
RBC UA: NEGATIVE

## 2014-11-10 MED ORDER — LABETALOL HCL 100 MG PO TABS: 100.0000 mg | ORAL_TABLET | Freq: Two times a day (BID) | ORAL | Status: DC

## 2014-11-10 NOTE — Progress Notes (Signed)
Pt states that her BP is worrying her.

## 2014-11-10 NOTE — Patient Instructions (Addendum)

## 2014-11-10 NOTE — Progress Notes (Signed)
Fetal Surveillance Testing today:  none   High Risk Pregnancy Diagnosis(es):   CHTN (vs GHTN)  G2P0010 343w2d Estimated Date of Delivery: 03/07/15  Blood pressure 140/100, pulse 120, weight 162 lb (73.483 kg), last menstrual period 05/31/2014.  Urinalysis: Negative  HPI: The patient is being seen today for ongoing management of pregnancy.  She "felt funny and saw sparklers" 2 days ago, so checked her BP.  It was 152/100, so she went to MAU.  Her BP there was 152/97.  She has checked it several times a day, and can range to 120/70 to 150/100. Preeclampsia labs normal 3/21.  She has had several other occasions where she "sees sparklers".  No HA, RUQ pain.  DTR's 2+.    BP weight and urine results all reviewed and noted. Patient reports good fetal movement, denies any bleeding and no rupture of membranes symptoms or regular contractions.  Fundal Height:  23 Fetal Heart rate:  148 Edema:  None.  States she has had some swelling in her hands  All questions were answered.  All lab and sonogram results have been reviewed.  Assessment:  1.  Pregnancy at 6243w2d,  Estimated Date of Delivery: 03/07/15 :               2.  Gestational Hypertension.  Possibly chronic, but no way to know for sure                          Medication(s) Plans:  Labetalol 100mg  BID  Treatment Plan:  Check BP at home (bought a new BP monitor and it is calibrated correctly).  24 hour urine to be turned in Monday  Follow up in 1 weeks for appointment for high risk OB care, BP check.

## 2014-11-13 LAB — PROTEIN, URINE, 24 HOUR
Protein, 24H Urine: <92 mg/24 hr (ref 30.0–150.0)
Protein, Ur: <4 mg/dL (ref 0.0–15.0)

## 2014-11-15 ENCOUNTER — Ambulatory Visit (INDEPENDENT_AMBULATORY_CARE_PROVIDER_SITE_OTHER): Payer: BLUE CROSS/BLUE SHIELD | Admitting: Obstetrics & Gynecology

## 2014-11-15 ENCOUNTER — Encounter: Payer: Self-pay | Admitting: Obstetrics & Gynecology

## 2014-11-15 VITALS — BP 130/100 | HR 88 | Wt 166.0 lb

## 2014-11-15 DIAGNOSIS — Z1389 Encounter for screening for other disorder: Secondary | ICD-10-CM

## 2014-11-15 DIAGNOSIS — Z331 Pregnant state, incidental: Secondary | ICD-10-CM

## 2014-11-15 DIAGNOSIS — Z029 Encounter for administrative examinations, unspecified: Secondary | ICD-10-CM

## 2014-11-15 DIAGNOSIS — O09892 Supervision of other high risk pregnancies, second trimester: Secondary | ICD-10-CM

## 2014-11-15 DIAGNOSIS — O10912 Unspecified pre-existing hypertension complicating pregnancy, second trimester: Secondary | ICD-10-CM

## 2014-11-15 LAB — POCT URINALYSIS DIPSTICK
Blood, UA: NEGATIVE
Glucose, UA: NEGATIVE
KETONES UA: NEGATIVE
Leukocytes, UA: NEGATIVE
NITRITE UA: NEGATIVE
Protein, UA: NEGATIVE

## 2014-11-15 MED ORDER — LABETALOL HCL 200 MG PO TABS: 100.0000 mg | ORAL_TABLET | Freq: Two times a day (BID) | ORAL | Status: DC

## 2014-11-15 NOTE — Progress Notes (Signed)
Fetal Surveillance Testing today:  none   High Risk Pregnancy Diagnosis(es):   Chronic hypertension  G2P0010 6727w0d Estimated Date of Delivery: 03/07/15  Blood pressure 130/100, pulse 88, weight 166 lb (75.297 kg), last menstrual period 05/31/2014.  Urinalysis: Negative   HPI: The patient is being seen today for ongoing management of chronic hypertension. Today she reports no headaches   BP weight and urine results all reviewed and noted. Patient reports good fetal movement, denies any bleeding and no rupture of membranes symptoms or regular contractions.  Fundal Height:  24 Fetal Heart rate:  146 Edema:  none  Patient is without complaints other than noted in her HPI. All questions were answered.  All lab and sonogram results have been reviewed. Comments: normal   Assessment:  1.  Pregnancy at 127w0d,  Estimated Date of Delivery: 03/07/15 :                          2.  Chronic Hypertension                        3.    Medication(s) Plans:  Increase labetalol 200 BID  Treatment Plan:  Per protocol  Follow up in 2 weeks for appointment for high risk OB care,

## 2014-11-29 ENCOUNTER — Ambulatory Visit (INDEPENDENT_AMBULATORY_CARE_PROVIDER_SITE_OTHER): Payer: BLUE CROSS/BLUE SHIELD | Admitting: Obstetrics & Gynecology

## 2014-11-29 ENCOUNTER — Other Ambulatory Visit: Payer: Self-pay | Admitting: Obstetrics & Gynecology

## 2014-11-29 ENCOUNTER — Encounter: Payer: Self-pay | Admitting: Obstetrics & Gynecology

## 2014-11-29 VITALS — BP 132/94 | HR 104 | Wt 168.4 lb

## 2014-11-29 DIAGNOSIS — O09892 Supervision of other high risk pregnancies, second trimester: Secondary | ICD-10-CM

## 2014-11-29 DIAGNOSIS — Z1389 Encounter for screening for other disorder: Secondary | ICD-10-CM

## 2014-11-29 DIAGNOSIS — Z331 Pregnant state, incidental: Secondary | ICD-10-CM

## 2014-11-29 DIAGNOSIS — O10912 Unspecified pre-existing hypertension complicating pregnancy, second trimester: Secondary | ICD-10-CM

## 2014-11-29 LAB — POCT URINALYSIS DIPSTICK
GLUCOSE UA: NEGATIVE
KETONES UA: NEGATIVE
Leukocytes, UA: NEGATIVE
Nitrite, UA: NEGATIVE
Protein, UA: NEGATIVE
RBC UA: NEGATIVE

## 2014-11-29 MED ORDER — CYCLOBENZAPRINE HCL 10 MG PO TABS: 10.0000 mg | ORAL_TABLET | Freq: Three times a day (TID) | ORAL | Status: DC | PRN

## 2014-11-29 NOTE — Addendum Note (Signed)
Addended by: Criss AlvinePULLIAM, Parnell Spieler G on: 11/29/2014 05:22 PM   Modules accepted: Orders

## 2014-11-29 NOTE — Progress Notes (Signed)
Fetal Surveillance Testing today:  none   High Risk Pregnancy Diagnosis(es):   Chronic Hypertension  G2P0010 832w0d Estimated Date of Delivery: 03/07/15  Blood pressure 170/100, pulse 104, weight 168 lb 6.4 oz (76.386 kg), last menstrual period 05/31/2014.  Urinalysis: Negative   HPI: The patient is being seen today for ongoing management of chronic hypertension. Today she reports feeling tired   BP weight and urine results all reviewed and noted. Patient reports good fetal movement, denies any bleeding and no rupture of membranes symptoms or regular contractions.  Fundal Height:  27 Fetal Heart rate:  145 Edema:  none  Patient is without complaints other than noted in her HPI. All questions were answered.  All lab and sonogram results have been reviewed. Comments: normal   Assessment:  1.  Pregnancy at 9732w0d,  Estimated Date of Delivery: 03/07/15 :                          2.  Chronic Hypertension                        3.    Medication(s) Plans:  Continue labetalol and baby asa  Treatment Plan:  No changes for now  Follow up in 2 weeks for appointment for high risk OB care, PN2

## 2014-12-13 ENCOUNTER — Other Ambulatory Visit: Payer: BLUE CROSS/BLUE SHIELD

## 2014-12-13 ENCOUNTER — Encounter: Payer: Self-pay | Admitting: Obstetrics & Gynecology

## 2014-12-13 ENCOUNTER — Ambulatory Visit (INDEPENDENT_AMBULATORY_CARE_PROVIDER_SITE_OTHER): Payer: BLUE CROSS/BLUE SHIELD | Admitting: Obstetrics & Gynecology

## 2014-12-13 VITALS — BP 112/80 | HR 72 | Wt 170.3 lb

## 2014-12-13 DIAGNOSIS — O0993 Supervision of high risk pregnancy, unspecified, third trimester: Secondary | ICD-10-CM

## 2014-12-13 DIAGNOSIS — Z331 Pregnant state, incidental: Secondary | ICD-10-CM

## 2014-12-13 DIAGNOSIS — Z369 Encounter for antenatal screening, unspecified: Secondary | ICD-10-CM

## 2014-12-13 DIAGNOSIS — Z131 Encounter for screening for diabetes mellitus: Secondary | ICD-10-CM

## 2014-12-13 DIAGNOSIS — Z1389 Encounter for screening for other disorder: Secondary | ICD-10-CM

## 2014-12-13 DIAGNOSIS — O10912 Unspecified pre-existing hypertension complicating pregnancy, second trimester: Secondary | ICD-10-CM

## 2014-12-13 LAB — POCT URINALYSIS DIPSTICK
GLUCOSE UA: NEGATIVE
KETONES UA: NEGATIVE
Leukocytes, UA: NEGATIVE
Nitrite, UA: NEGATIVE
Protein, UA: NEGATIVE
RBC UA: NEGATIVE

## 2014-12-13 NOTE — Addendum Note (Signed)
Addended by: Colen DarlingYOUNG, Emmelyn Schmale S on: 12/13/2014 10:54 AM   Modules accepted: Orders

## 2014-12-13 NOTE — Progress Notes (Signed)
Fetal Surveillance Testing today:  general   High Risk Pregnancy Diagnosis(es):   Chronic Hypertension  G2P0010 6994w0d Estimated Date of Delivery: 03/07/15  Blood pressure 112/80, pulse 72, weight 170 lb 4.8 oz (77.248 kg), last menstrual period 05/31/2014.  Urinalysis: Negative   HPI: The patient is being seen today for ongoing management of Chronic hypertension. Today she reports tired and feeling run down   BP weight and urine results all reviewed and noted. Patient reports good fetal movement, denies any bleeding and no rupture of membranes symptoms or regular contractions.  Fundal Height:  30 Fetal Heart rate:  140 Edema:  trace  Patient is without complaints other than noted in her HPI. All questions were answered.  All lab and sonogram results have been reviewed. Comments: normal   Assessment:  1.  Pregnancy at 494w0d,  Estimated Date of Delivery: 03/07/15 :                          2.  Chronic Hypertension                        3.    Medication(s) Plans:  Labetalol 200 BID  Treatment Plan:  No changes  Follow up in 2 weeks for appointment for high risk OB care,

## 2014-12-14 LAB — CBC
HEMATOCRIT: 33.4 % — AB (ref 34.0–46.6)
HEMOGLOBIN: 11.3 g/dL (ref 11.1–15.9)
MCH: 28.8 pg (ref 26.6–33.0)
MCHC: 33.8 g/dL (ref 31.5–35.7)
MCV: 85 fL (ref 79–97)
Platelets: 245 10*3/uL (ref 150–379)
RBC: 3.93 x10E6/uL (ref 3.77–5.28)
RDW: 13.9 % (ref 12.3–15.4)
WBC: 9.9 10*3/uL (ref 3.4–10.8)

## 2014-12-14 LAB — HSV 2 ANTIBODY, IGG

## 2014-12-14 LAB — HIV ANTIBODY (ROUTINE TESTING W REFLEX): HIV Screen 4th Generation wRfx: NONREACTIVE

## 2014-12-14 LAB — ANTIBODY SCREEN: Antibody Screen: NEGATIVE

## 2014-12-14 LAB — RPR: RPR: NONREACTIVE

## 2014-12-14 LAB — GLUCOSE TOLERANCE, 2 HOURS W/ 1HR
GLUCOSE, 1 HOUR: 125 mg/dL (ref 65–179)
GLUCOSE, 2 HOUR: 127 mg/dL (ref 65–152)
GLUCOSE, FASTING: 82 mg/dL (ref 65–91)

## 2014-12-27 ENCOUNTER — Encounter: Payer: Self-pay | Admitting: Obstetrics & Gynecology

## 2014-12-27 ENCOUNTER — Ambulatory Visit (INDEPENDENT_AMBULATORY_CARE_PROVIDER_SITE_OTHER): Payer: BLUE CROSS/BLUE SHIELD | Admitting: Obstetrics & Gynecology

## 2014-12-27 ENCOUNTER — Encounter: Payer: BLUE CROSS/BLUE SHIELD | Admitting: Adult Health

## 2014-12-27 VITALS — BP 140/80 | HR 88 | Wt 176.0 lb

## 2014-12-27 DIAGNOSIS — Z1389 Encounter for screening for other disorder: Secondary | ICD-10-CM

## 2014-12-27 DIAGNOSIS — O0943 Supervision of pregnancy with grand multiparity, third trimester: Secondary | ICD-10-CM

## 2014-12-27 DIAGNOSIS — O133 Gestational [pregnancy-induced] hypertension without significant proteinuria, third trimester: Secondary | ICD-10-CM

## 2014-12-27 DIAGNOSIS — Z331 Pregnant state, incidental: Secondary | ICD-10-CM

## 2014-12-27 DIAGNOSIS — O09893 Supervision of other high risk pregnancies, third trimester: Secondary | ICD-10-CM

## 2014-12-27 LAB — POCT URINALYSIS DIPSTICK
GLUCOSE UA: NEGATIVE
Ketones, UA: NEGATIVE
Leukocytes, UA: NEGATIVE
NITRITE UA: NEGATIVE
Protein, UA: NEGATIVE
RBC UA: NEGATIVE

## 2014-12-27 MED ORDER — ZOLPIDEM TARTRATE 10 MG PO TABS: 10.0000 mg | ORAL_TABLET | Freq: Every evening | ORAL | Status: DC | PRN

## 2014-12-27 NOTE — Progress Notes (Signed)
Fetal Surveillance Testing today:  none   High Risk Pregnancy Diagnosis(es):   Chronic hypertension  G2P0010 9669w0d Estimated Date of Delivery: 03/07/15  Blood pressure 140/80, pulse 88, weight 176 lb (79.833 kg), last menstrual period 05/31/2014.  Urinalysis: Negative   HPI: The patient is being seen today for ongoing management of chronic hypertension. Today she reports fatigue, pelvic pressure and cramping   BP weight and urine results all reviewed and noted. Patient reports good fetal movement, denies any bleeding and no rupture of membranes symptoms or regular contractions.  Fundal Height:  29cm Fetal Heart rate:  145bpm Edema:  none  Patient is without complaints other than noted in her HPI. All questions were answered.  All lab and sonogram results have been reviewed. Comments: normal   Assessment:  1.  Pregnancy at 8969w0d,  Estimated Date of Delivery: 03/07/15 :                          2.  Chronic hypertension - labetalol 200 mg bid                        3.  Insomnia  Medication(s) Plans:  No change  Treatment Plan:  Try a trial of ambien 10 qhs  Follow up in 2 weeks for appointment for high risk OB care

## 2015-01-04 ENCOUNTER — Telehealth: Payer: Self-pay | Admitting: Obstetrics & Gynecology

## 2015-01-04 NOTE — Telephone Encounter (Signed)
Pt requesting a note to work 6 hour days verses 8 hours weekly. Pt states she had discussed with Dr. Despina HiddenEure at last appt. 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

## 2015-01-05 ENCOUNTER — Telehealth: Payer: Self-pay | Admitting: *Deleted

## 2015-01-05 NOTE — Telephone Encounter (Signed)
Pt states she had to leave work early today due to her job. Pt states "job is hard on her due to bending and being on her feet," B/P 156/110. Pt states Dr. Despina HiddenEure aware of pt elevated blood pressure and she has a journal with B/P measures documented. Pt requesting a note to excuse for work today.

## 2015-01-06 ENCOUNTER — Encounter: Payer: Self-pay | Admitting: Obstetrics & Gynecology

## 2015-01-06 NOTE — Telephone Encounter (Signed)
Pt aware note left at front for pick up.

## 2015-01-10 ENCOUNTER — Ambulatory Visit (INDEPENDENT_AMBULATORY_CARE_PROVIDER_SITE_OTHER): Payer: BLUE CROSS/BLUE SHIELD | Admitting: Women's Health

## 2015-01-10 ENCOUNTER — Other Ambulatory Visit: Payer: Self-pay | Admitting: Advanced Practice Midwife

## 2015-01-10 ENCOUNTER — Encounter: Payer: Self-pay | Admitting: Women's Health

## 2015-01-10 VITALS — BP 142/96 | HR 84 | Wt 182.0 lb

## 2015-01-10 DIAGNOSIS — O09893 Supervision of other high risk pregnancies, third trimester: Secondary | ICD-10-CM

## 2015-01-10 DIAGNOSIS — Z331 Pregnant state, incidental: Secondary | ICD-10-CM

## 2015-01-10 DIAGNOSIS — Z1389 Encounter for screening for other disorder: Secondary | ICD-10-CM

## 2015-01-10 DIAGNOSIS — Z3491 Encounter for supervision of normal pregnancy, unspecified, first trimester: Secondary | ICD-10-CM

## 2015-01-10 DIAGNOSIS — O10919 Unspecified pre-existing hypertension complicating pregnancy, unspecified trimester: Secondary | ICD-10-CM

## 2015-01-10 DIAGNOSIS — O10912 Unspecified pre-existing hypertension complicating pregnancy, second trimester: Secondary | ICD-10-CM

## 2015-01-10 LAB — POCT URINALYSIS DIPSTICK
Blood, UA: NEGATIVE
Glucose, UA: NEGATIVE
Ketones, UA: NEGATIVE
Leukocytes, UA: NEGATIVE
Nitrite, UA: NEGATIVE
PROTEIN UA: NEGATIVE

## 2015-01-10 NOTE — Patient Instructions (Signed)
Call the office [phone]) or go to [facility] if:  You begin to have strong, frequent contractions  Your water breaks.  Sometimes it is a big gush of fluid, sometimes it is just a trickle that keeps getting your panties wet or running down your legs  You have vaginal bleeding.  It is normal to have a small amount of spotting if your cervix was checked.   You don't feel your baby moving like normal.  If you don't, get you something to eat and drink and lay down and focus on feeling your baby move.  You should feel at least 10 movements in 2 hours.  If you don't, you should call the office or go to [facility].    Thigh-high compression stockings at [facility] if needed  Tips to Help Leg Cramps  Increase dietary sources of calcium (milk, yogurt, cheese, leafy greens, seafood, legumes, and fruit) and magnesium (dark leafy greens, nuts, seeds, fish, beans, whole grains, avocados, yogurt, bananas, dried fruit, dark chocolate)  Spoonful of regular yellow mustard every night  Magnesium supplement: in the morning, at night (can find in the vitamin aisle)  Dorsiflexion of foot: pointing your toes back towards your knee during the cramp     Third Trimester of Pregnancy The third trimester is from week 29 through week 42, months 7 through 9. The third trimester is a time when the fetus is growing rapidly. At the end of the ninth month, the fetus is about 20 inches in length and weighs 6-10 pounds.  BODY CHANGES Your body goes through many changes during pregnancy. The changes vary from woman to woman.   Your weight will continue to increase. You can expect to gain 25-35 pounds (11-16 kg) by the end of the pregnancy.  You may begin to get stretch marks on your hips, abdomen, and breasts.  You may urinate more often because the fetus is moving lower into your pelvis and pressing on your bladder.  You may develop or continue to have heartburn as a result of your  pregnancy.  You may develop constipation because certain hormones are causing the muscles that push waste through your intestines to slow down.  You may develop hemorrhoids or swollen, bulging veins (varicose veins).  You may have pelvic pain because of the weight gain and pregnancy hormones relaxing your joints between the bones in your pelvis. Backaches may result from overexertion of the muscles supporting your posture.  You may have changes in your hair. These can include thickening of your hair, rapid growth, and changes in texture. Some women also have hair loss during or after pregnancy, or hair that feels dry or thin. Your hair will most likely return to normal after your baby is born.  Your breasts will continue to grow and be tender. A yellow discharge may leak from your breasts called colostrum.  Your belly button may stick out.  You may feel short of breath because of your expanding uterus.  You may notice the fetus "dropping," or moving lower in your abdomen.  You may have a bloody mucus discharge. This usually occurs a few days to a week before labor begins.  Your cervix becomes thin and soft (effaced) near your due date. WHAT TO EXPECT AT YOUR PRENATAL EXAMS  You will have prenatal exams every 2 weeks until week 36. Then, you will have weekly prenatal exams. During a routine prenatal visit:  You will be weighed to make sure you and the  fetus are growing normally.  Your blood pressure is taken.  Your abdomen will be measured to track your baby's growth.  The fetal heartbeat will be listened to.  Any test results from the previous visit will be discussed.  You may have a cervical check near your due date to see if you have effaced. At around 36 weeks, your caregiver will check your cervix. At the same time, your caregiver will also perform a test on the secretions of the vaginal tissue. This test is to determine if a type of bacteria, Group B streptococcus, is present.  Your caregiver will explain this further. Your caregiver may ask you:  What your birth plan is.  How you are feeling.  If you are feeling the baby move.  If you have had any abnormal symptoms, such as leaking fluid, bleeding, severe headaches, or abdominal cramping.  If you have any questions. Other tests or screenings that may be performed during your third trimester include:  Blood tests that check for low iron levels (anemia).  Fetal testing to check the health, activity level, and growth of the fetus. Testing is done if you have certain medical conditions or if there are problems during the pregnancy. FALSE LABOR You may feel small, irregular contractions that eventually go away. These are called Braxton Hicks contractions, or false labor. Contractions may last for hours, days, or even weeks before true labor sets in. If contractions come at regular intervals, intensify, or become painful, it is best to be seen by your caregiver.  SIGNS OF LABOR   Menstrual-like cramps.  Contractions that are 5 minutes apart or less.  Contractions that start on the top of the uterus and spread down to the lower abdomen and back.  A sense of increased pelvic pressure or back pain.  A watery or bloody mucus discharge that comes from the vagina. If you have any of these signs before the 37th week of pregnancy, call your caregiver right away. You need to go to the hospital to get checked immediately. HOME CARE INSTRUCTIONS   Avoid all smoking, herbs, alcohol, and unprescribed drugs. These chemicals affect the formation and growth of the baby.  Follow your caregiver's instructions regarding medicine use. There are medicines that are either safe or unsafe to take during pregnancy.  Exercise only as directed by your caregiver. Experiencing uterine cramps is a good sign to stop exercising.  Continue to eat regular, healthy meals.  Wear a good support bra for breast tenderness.  Do not use hot  tubs, steam rooms, or saunas.  Wear your seat belt at all times when driving.  Avoid raw meat, uncooked cheese, cat litter boxes, and soil used by cats. These carry germs that can cause birth defects in the baby.  Take your prenatal vitamins.  Try taking a stool softener (if your caregiver approves) if you develop constipation. Eat more high-fiber foods, such as fresh vegetables or fruit and whole grains. Drink plenty of fluids to keep your urine clear or pale yellow.  Take warm sitz baths to soothe any pain or discomfort caused by hemorrhoids. Use hemorrhoid cream if your caregiver approves.  If you develop varicose veins, wear support hose. Elevate your feet for 15 minutes, 3-4 times a day. Limit salt in your diet.  Avoid heavy lifting, wear low heal shoes, and practice good posture.  Rest a lot with your legs elevated if you have leg cramps or low back pain.  Visit your dentist if you have not  gone during your pregnancy. Use a soft toothbrush to brush your teeth and be gentle when you floss.  A sexual relationship may be continued unless your caregiver directs you otherwise.  Do not travel far distances unless it is absolutely necessary and only with the approval of your caregiver.  Take prenatal classes to understand, practice, and ask questions about the labor and delivery.  Make a trial run to the hospital.  Pack your hospital bag.  Prepare the baby's nursery.  Continue to go to all your prenatal visits as directed by your caregiver. SEEK MEDICAL CARE IF:  You are unsure if you are in labor or if your water has broken.  You have dizziness.  You have mild pelvic cramps, pelvic pressure, or nagging pain in your abdominal area.  You have persistent nausea, vomiting, or diarrhea.  You have a bad smelling vaginal discharge.  You have pain with urination. SEEK IMMEDIATE MEDICAL CARE IF:   You have a fever.  You are leaking fluid from your vagina.  You have spotting  or bleeding from your vagina.  You have severe abdominal cramping or pain.  You have rapid weight loss or gain.  You have shortness of breath with chest pain.  You notice sudden or extreme swelling of your face, hands, ankles, feet, or legs.  You have not felt your baby move in over an hour.  You have severe headaches that do not go away with medicine.  You have vision changes. Document Released: 07/30/2001 Document Revised: 08/10/2013 Document Reviewed: 10/06/2012 [facility] Patient Information 2015 [place], [place]. This information is not intended to replace advice given to you by your health care provider. Make sure you discuss any questions you have with your health care provider.

## 2015-01-10 NOTE — Progress Notes (Signed)
High Risk Pregnancy Diagnosis(es): CHTN G2P0010 4458w0d Estimated Date of Delivery: 03/07/15 BP 142/96 mmHg  Pulse 84  Wt 182 lb (82.555 kg)  LMP 05/31/2014  Urinalysis: Negative HPI:  Doing well, 'bruises' on legs that won't go away, has to have dental work tomorrow- needs release- given. Needs note for work that she can have frequent breaks/sit down as needed. Leg cramps.  BP, weight, and urine reviewed.  Reports good fm. Denies regular uc's, lof, vb, uti s/s.   Fundal Height:  31 Fetal Heart rate:  143 Edema:  Trace Legs: 'bruising' actually looks like small spider veins  Reviewed ptl s/s, fkc. Can get All questions were answered Assessment: 7258w0d CHTN Medication(s) Plans:  Continue labetalol 200mg  BID Treatment Plan:  Begin 2x/wk testing sono/nst, IOL @ 39wks Follow up in 3d for high-risk OB appt and efw/afi/bpp/dopp u/s

## 2015-01-13 ENCOUNTER — Ambulatory Visit (INDEPENDENT_AMBULATORY_CARE_PROVIDER_SITE_OTHER): Payer: BLUE CROSS/BLUE SHIELD | Admitting: Obstetrics & Gynecology

## 2015-01-13 ENCOUNTER — Encounter: Payer: Self-pay | Admitting: Obstetrics & Gynecology

## 2015-01-13 VITALS — BP 130/90 | HR 84 | Wt 183.4 lb

## 2015-01-13 DIAGNOSIS — O10912 Unspecified pre-existing hypertension complicating pregnancy, second trimester: Secondary | ICD-10-CM

## 2015-01-13 DIAGNOSIS — O10919 Unspecified pre-existing hypertension complicating pregnancy, unspecified trimester: Secondary | ICD-10-CM

## 2015-01-13 DIAGNOSIS — O09893 Supervision of other high risk pregnancies, third trimester: Secondary | ICD-10-CM

## 2015-01-13 DIAGNOSIS — Z1389 Encounter for screening for other disorder: Secondary | ICD-10-CM

## 2015-01-13 DIAGNOSIS — Z331 Pregnant state, incidental: Secondary | ICD-10-CM

## 2015-01-13 LAB — POCT URINALYSIS DIPSTICK
Blood, UA: NEGATIVE
Glucose, UA: NEGATIVE
Ketones, UA: NEGATIVE
Leukocytes, UA: NEGATIVE
Nitrite, UA: NEGATIVE
PROTEIN UA: NEGATIVE

## 2015-01-13 NOTE — Progress Notes (Signed)
Fetal Surveillance Testing today:  Reactive NST   High Risk Pregnancy Diagnosis(es):   Chronic hypertension on labetalol 100 mg twice a day  G2P0010 6187w3d Estimated Date of Delivery: 03/07/15  Blood pressure 130/90, pulse 84, weight 183 lb 6.4 oz (83.19 kg), last menstrual period 05/31/2014.  Urinalysis: Negative   HPI: The patient is being seen today for ongoing management of chronic hypertension. Today she reports some pelvic pressure and some cramping nothing in usual   BP weight and urine results all reviewed and noted. Patient reports good fetal movement, denies any bleeding and no rupture of membranes symptoms or regular contractions.  Fundal Height:  34 Fetal Heart rate:  145 Edema:  1+  Patient is without complaints other than noted in her HPI. All questions were answered.  All lab and sonogram results have been reviewed. Comments: normal   Assessment:  1.  Pregnancy at 987w3d,  Estimated Date of Delivery: 03/07/15 :                          2.  Chronic hypertension                        3.    Medication(s) Plans:  Continue labetalol 100 mg twice a day and baby aspirin  Treatment Plan:  Twice weekly assessments NST alternating with sonogram  Follow up in Monday weeks for appointment for high risk OB care, NST

## 2015-01-17 ENCOUNTER — Encounter: Payer: BLUE CROSS/BLUE SHIELD | Admitting: Women's Health

## 2015-01-17 ENCOUNTER — Other Ambulatory Visit: Payer: BLUE CROSS/BLUE SHIELD

## 2015-01-18 ENCOUNTER — Ambulatory Visit (INDEPENDENT_AMBULATORY_CARE_PROVIDER_SITE_OTHER): Payer: BLUE CROSS/BLUE SHIELD

## 2015-01-18 ENCOUNTER — Ambulatory Visit (INDEPENDENT_AMBULATORY_CARE_PROVIDER_SITE_OTHER): Payer: BLUE CROSS/BLUE SHIELD | Admitting: Women's Health

## 2015-01-18 ENCOUNTER — Encounter: Payer: Self-pay | Admitting: Women's Health

## 2015-01-18 VITALS — BP 144/96 | HR 96 | Wt 182.8 lb

## 2015-01-18 DIAGNOSIS — Z331 Pregnant state, incidental: Secondary | ICD-10-CM

## 2015-01-18 DIAGNOSIS — O10912 Unspecified pre-existing hypertension complicating pregnancy, second trimester: Secondary | ICD-10-CM

## 2015-01-18 DIAGNOSIS — O09893 Supervision of other high risk pregnancies, third trimester: Secondary | ICD-10-CM

## 2015-01-18 DIAGNOSIS — G5600 Carpal tunnel syndrome, unspecified upper limb: Secondary | ICD-10-CM

## 2015-01-18 DIAGNOSIS — O10919 Unspecified pre-existing hypertension complicating pregnancy, unspecified trimester: Secondary | ICD-10-CM

## 2015-01-18 DIAGNOSIS — O26899 Other specified pregnancy related conditions, unspecified trimester: Secondary | ICD-10-CM

## 2015-01-18 DIAGNOSIS — Z1389 Encounter for screening for other disorder: Secondary | ICD-10-CM

## 2015-01-18 DIAGNOSIS — Z3491 Encounter for supervision of normal pregnancy, unspecified, first trimester: Secondary | ICD-10-CM

## 2015-01-18 LAB — POCT URINALYSIS DIPSTICK
GLUCOSE UA: NEGATIVE
Ketones, UA: NEGATIVE
LEUKOCYTES UA: NEGATIVE
Nitrite, UA: NEGATIVE
PROTEIN UA: NEGATIVE
RBC UA: NEGATIVE

## 2015-01-18 NOTE — Patient Instructions (Addendum)
Call the office ([phone]) or go to [facility] if:  You begin to have strong, frequent contractions  Your water breaks.  Sometimes it is a big gush of fluid, sometimes it is just a trickle that keeps getting your panties wet or running down your legs  You have vaginal bleeding.  It is normal to have a small amount of spotting if your cervix was checked.   You don't feel your baby moving like normal.  If you don't, get you something to eat and drink and lay down and focus on feeling your baby move.  You should feel at least 10 movements in 2 hours.  If you don't, you should call the office or go to [facility].    Preterm Labor Information Preterm labor is when labor starts at less than 37 weeks of pregnancy. The normal length of a pregnancy is 39 to 41 weeks. CAUSES Often, there is no identifiable underlying cause as to why a woman goes into preterm labor. One of the most common known causes of preterm labor is infection. Infections of the uterus, cervix, vagina, amniotic sac, bladder, kidney, or even the lungs (pneumonia) can cause labor to start. Other suspected causes of preterm labor include:   Urogenital infections, such as yeast infections and bacterial vaginosis.   Uterine abnormalities (uterine shape, uterine septum, fibroids, or bleeding from the placenta).   A cervix that has been operated on (it may fail to stay closed).   Malformations in the fetus.   Multiple gestations (twins, triplets, and so on).   Breakage of the amniotic sac.  RISK FACTORS  Having a previous history of preterm labor.   Having premature rupture of membranes (PROM).   Having a placenta that covers the opening of the cervix (placenta previa).   Having a placenta that separates from the uterus (placental abruption).   Having a cervix that is too weak to hold the fetus in the uterus (incompetent cervix).   Having too much fluid in the amniotic sac (polyhydramnios).   Taking  illegal drugs or smoking while pregnant.   Not gaining enough weight while pregnant.   Being younger than 18 and older than 33 years old.   Having a low socioeconomic status.   Being African American. SYMPTOMS Signs and symptoms of preterm labor include:   Menstrual-like cramps, abdominal pain, or back pain.  Uterine contractions that are regular, as frequent as six in an hour, regardless of their intensity (may be mild or painful).  Contractions that start on the top of the uterus and spread down to the lower abdomen and back.   A sense of increased pelvic pressure.   A watery or bloody mucus discharge that comes from the vagina.  TREATMENT Depending on the length of the pregnancy and other circumstances, your health care provider may suggest bed rest. If necessary, there are medicines that can be given to stop contractions and to mature the fetal lungs. If labor happens before 34 weeks of pregnancy, a prolonged hospital stay may be recommended. Treatment depends on the condition of both you and the fetus.  WHAT SHOULD YOU DO IF YOU THINK YOU ARE IN PRETERM LABOR? Call your health care provider right away. You will need to go to the hospital to get checked immediately. HOW CAN YOU PREVENT PRETERM LABOR IN FUTURE PREGNANCIES? You should:   Stop smoking if you smoke.  Maintain healthy weight gain and avoid chemicals and drugs that are not necessary.  Be watchful for   any type of infection.  Inform your health care provider if you have a known history of preterm labor. Document Released: 10/26/2003 Document Revised: 04/07/2013 Document Reviewed: 09/07/2012 [employer] Patient Information 2015 [employer]. This information is not intended to replace advice given to you by your health care provider. Make sure you discuss any questions you have with your health care provider.  

## 2015-01-18 NOTE — Progress Notes (Signed)
High Risk Pregnancy Diagnosis(es): CHTN G2P0010 2819w1d Estimated Date of Delivery: 03/07/15 BP 144/96 mmHg  Pulse 96  Wt 182 lb 12 oz (82.895 kg)  LMP 05/31/2014  Urinalysis: Negative HPI:  Hands go numb at night, feet occ numb & purple- no calf pain. Work is not allowing her to take any breaks, took bp yesterday while at work and was elevated and asked to sit down- boss would not allow it. Wants to go ahead and come out of work. Note given.  BP, weight, and urine reviewed.  Reports good fm. Denies regular uc's, lof, vb, uti s/s. No complaints.  Fundal Height:  33 Fetal Heart rate:  +u/s Edema:  Trace Extremities: Upper- +Phalens Lower: spider veins stable in Rt lateral knee area, dusky in appearance near feet/ankles, trace edema, cap refill 2-3sec, unable to feel pedal pulses well, neg homan's- no redness/swelling of calf, co-exam w/ JVF- likely unable to feel pulses well d/t edema- feels reassured, no need for dopplers, etc at this time  Reviewed today's normal u/s: bpp 8/8, efw 52%, afi 12.3cm, normal dopplers. Discussed ptl s/s, fkc All questions were answered Assessment: 8719w1d CHTN, Carpal tunnel syndrome of pregnancy, Decreased circulation bilateral lower extremities Medication(s) Plans:  Continue labetalol 100mg  bid (accidentally charted as 200mg  bid on last visit w/ me) and baby asa daily Treatment Plan:  Continue 2x/wk testing sono/nst, deliver at 39wks. Rx for 20-4030mmgh bilateral thigh-high compression hose, wrist splints at night for CTS Follow up as scheduled for high-risk OB appt and NST

## 2015-01-18 NOTE — Progress Notes (Signed)
[place] 33+1wks,measurements c/w dates,efw 2222g 52%,cephalic,afi 12.3cm,normal ov's bilat,RI .55,.58 S/D 2.24,2.40,ant pl gr 3,BPP 8/8

## 2015-01-23 ENCOUNTER — Ambulatory Visit (INDEPENDENT_AMBULATORY_CARE_PROVIDER_SITE_OTHER): Payer: BLUE CROSS/BLUE SHIELD | Admitting: Obstetrics & Gynecology

## 2015-01-23 ENCOUNTER — Encounter: Payer: Self-pay | Admitting: Obstetrics & Gynecology

## 2015-01-23 VITALS — BP 130/80 | HR 80 | Wt 184.4 lb

## 2015-01-23 DIAGNOSIS — Z331 Pregnant state, incidental: Secondary | ICD-10-CM

## 2015-01-23 DIAGNOSIS — Z029 Encounter for administrative examinations, unspecified: Secondary | ICD-10-CM

## 2015-01-23 DIAGNOSIS — O10912 Unspecified pre-existing hypertension complicating pregnancy, second trimester: Secondary | ICD-10-CM

## 2015-01-23 DIAGNOSIS — O10919 Unspecified pre-existing hypertension complicating pregnancy, unspecified trimester: Secondary | ICD-10-CM

## 2015-01-23 DIAGNOSIS — Z1389 Encounter for screening for other disorder: Secondary | ICD-10-CM

## 2015-01-23 DIAGNOSIS — O09893 Supervision of other high risk pregnancies, third trimester: Secondary | ICD-10-CM

## 2015-01-23 LAB — POCT URINALYSIS DIPSTICK
Blood, UA: NEGATIVE
Glucose, UA: NEGATIVE
KETONES UA: NEGATIVE
Leukocytes, UA: NEGATIVE
NITRITE UA: NEGATIVE
PROTEIN UA: NEGATIVE

## 2015-01-23 NOTE — Progress Notes (Signed)
Fetal Surveillance Testing today:  Reactive NST   High Risk Pregnancy Diagnosis(es):   CHTN  G2P0010 5336w6d Estimated Date of Delivery: 03/07/15  Blood pressure 130/80, pulse 80, weight 184 lb 6.4 oz (83.643 kg), last menstrual period 05/31/2014.  Urinalysis: Negative   HPI: The patient is being seen today for ongoing management of CHTN. Today she reports stopped work, back ache pelvic pressure improved since then   BP weight and urine results all reviewed and noted. Patient reports good fetal movement, denies any bleeding and no rupture of membranes symptoms or regular contractions.  Fundal Height:  34 Fetal Heart rate:  135 Edema:  none  Patient is without complaints other than noted in her HPI. All questions were answered.  All lab and sonogram results have been reviewed. Comments: normal   Assessment:  1.  Pregnancy at 10736w6d,  Estimated Date of Delivery: 03/07/15 :                          2.  CHTN on labetalol 100 BID                        3.    Medication(s) Plans:  Labetalol 100 BID  Treatment Plan:  Twice weekly surveillance  Follow up in Thursday weeks for appointment for high risk OB care,sonogram

## 2015-01-26 ENCOUNTER — Ambulatory Visit (INDEPENDENT_AMBULATORY_CARE_PROVIDER_SITE_OTHER): Payer: BLUE CROSS/BLUE SHIELD

## 2015-01-26 ENCOUNTER — Ambulatory Visit (INDEPENDENT_AMBULATORY_CARE_PROVIDER_SITE_OTHER): Payer: BLUE CROSS/BLUE SHIELD | Admitting: Obstetrics & Gynecology

## 2015-01-26 ENCOUNTER — Encounter: Payer: Self-pay | Admitting: Obstetrics & Gynecology

## 2015-01-26 VITALS — BP 140/80 | HR 84 | Wt 186.0 lb

## 2015-01-26 DIAGNOSIS — O10919 Unspecified pre-existing hypertension complicating pregnancy, unspecified trimester: Secondary | ICD-10-CM

## 2015-01-26 DIAGNOSIS — Z1389 Encounter for screening for other disorder: Secondary | ICD-10-CM

## 2015-01-26 DIAGNOSIS — O10912 Unspecified pre-existing hypertension complicating pregnancy, second trimester: Secondary | ICD-10-CM | POA: Diagnosis not present

## 2015-01-26 DIAGNOSIS — O09893 Supervision of other high risk pregnancies, third trimester: Secondary | ICD-10-CM

## 2015-01-26 DIAGNOSIS — Z331 Pregnant state, incidental: Secondary | ICD-10-CM

## 2015-01-26 DIAGNOSIS — Z3491 Encounter for supervision of normal pregnancy, unspecified, first trimester: Secondary | ICD-10-CM

## 2015-01-26 LAB — POCT URINALYSIS DIPSTICK
Blood, UA: NEGATIVE
GLUCOSE UA: NEGATIVE
Ketones, UA: NEGATIVE
LEUKOCYTES UA: NEGATIVE
NITRITE UA: NEGATIVE
PROTEIN UA: NEGATIVE

## 2015-01-26 NOTE — Progress Notes (Addendum)
[place] 34+2WKS,cephalic,BPP 8/8,RI .59,.59,AFI 14.9cm,bilat adnexa's wnl,ant pl gr3,pos fht 174bpm

## 2015-01-26 NOTE — Progress Notes (Signed)
Fetal Surveillance Testing today:  Sonogram with BPP 8/8 and excellent cord blood flow   High Risk Pregnancy Diagnosis(es):   Chronic hypertension  G2P0010 [redacted]w[redacted]d Estimated Date of Delivery: 03/07/15  Blood pressure 140/80, pulse 84, weight 186 lb (84.369 kg), last menstrual period 05/31/2014.  Urinalysis: Negative   HPI: The patient is being seen today for ongoing management of chronic hypertension. Today she reports Braxton-Hicks contractions   BP weight and urine results all reviewed and noted. Patient reports good fetal movement, denies any bleeding and no rupture of membranes symptoms or regular contractions.  Fundal Height:  34 Fetal Heart rate:  174 Edema:  none  Patient is without complaints other than noted in her HPI. All questions were answered.  All lab and sonogram results have been reviewed. Comments: normal   Assessment:  1.  Pregnancy at [redacted]w[redacted]d,  Estimated Date of Delivery: 03/07/15 :                          2.  Chronic hypertension                        3.    Medication(s) Plans:  Continue labetalol 100 BID  Treatment Plan:  Twice weekly surveillance  Follow up in monday weeks for appointment for high risk OB care, NST

## 2015-01-30 ENCOUNTER — Encounter: Payer: Self-pay | Admitting: Women's Health

## 2015-01-30 ENCOUNTER — Ambulatory Visit (INDEPENDENT_AMBULATORY_CARE_PROVIDER_SITE_OTHER): Payer: BLUE CROSS/BLUE SHIELD | Admitting: Women's Health

## 2015-01-30 VITALS — BP 130/90 | HR 72 | Wt 190.0 lb

## 2015-01-30 DIAGNOSIS — O10912 Unspecified pre-existing hypertension complicating pregnancy, second trimester: Secondary | ICD-10-CM | POA: Diagnosis not present

## 2015-01-30 DIAGNOSIS — Z331 Pregnant state, incidental: Secondary | ICD-10-CM

## 2015-01-30 DIAGNOSIS — O09893 Supervision of other high risk pregnancies, third trimester: Secondary | ICD-10-CM

## 2015-01-30 DIAGNOSIS — O10919 Unspecified pre-existing hypertension complicating pregnancy, unspecified trimester: Secondary | ICD-10-CM

## 2015-01-30 DIAGNOSIS — Z1389 Encounter for screening for other disorder: Secondary | ICD-10-CM

## 2015-01-30 DIAGNOSIS — Z3491 Encounter for supervision of normal pregnancy, unspecified, first trimester: Secondary | ICD-10-CM

## 2015-01-30 LAB — POCT URINALYSIS DIPSTICK
Blood, UA: NEGATIVE
Glucose, UA: NEGATIVE
KETONES UA: NEGATIVE
Leukocytes, UA: NEGATIVE
Nitrite, UA: NEGATIVE
PROTEIN UA: NEGATIVE

## 2015-01-30 NOTE — Patient Instructions (Signed)
[place] Pediatricians/Family Doctors:  [facility] [phone]            [facility] [phone]                 [facility] [phone] (usually not accepting new patients unless you have family there already, you are always welcome to call and ask)            [facility] ([address] [place]) [phone]   [place] Pediatricians/Family Doctors:   [facility]: [phone]  [facility]: [phone]   Call the office [phone]) or go to [facility] if:  You begin to have strong, frequent contractions  Your water breaks.  Sometimes it is a big gush of fluid, sometimes it is just a trickle that keeps getting your panties wet or running down your legs  You have vaginal bleeding.  It is normal to have a small amount of spotting if your cervix was checked.   You don't feel your baby moving like normal.  If you don't, get you something to eat and drink and lay down and focus on feeling your baby move.  You should feel at least 10 movements in 2 hours.  If you don't, you should call the office or go to [facility].    Preterm Labor Information Preterm labor is when labor starts at less than 37 weeks of pregnancy. The normal length of a pregnancy is 39 to 41 weeks. CAUSES Often, there is no identifiable underlying cause as to why a woman goes into preterm labor. One of the most common known causes of preterm labor is infection. Infections of the uterus, cervix, vagina, amniotic sac, bladder, kidney, or even the lungs (pneumonia) can cause labor to start. Other suspected causes of preterm labor include:   Urogenital infections, such as yeast infections and bacterial vaginosis.   Uterine abnormalities (uterine shape, uterine septum, fibroids, or bleeding from the placenta).   A cervix that has been operated on (it may fail to stay closed).   Malformations in the fetus.   Multiple  gestations (twins, triplets, and so on).   Breakage of the amniotic sac.  RISK FACTORS  Having a previous history of preterm labor.   Having premature rupture of membranes (PROM).   Having a placenta that covers the opening of the cervix (placenta previa).   Having a placenta that separates from the uterus (placental abruption).   Having a cervix that is too weak to hold the fetus in the uterus (incompetent cervix).   Having too much fluid in the amniotic sac (polyhydramnios).   Taking illegal drugs or smoking while pregnant.   Not gaining enough weight while pregnant.   Being younger than 18 and older than 33 years old.   Having a low socioeconomic status.   Being African American. SYMPTOMS Signs and symptoms of preterm labor include:   Menstrual-like cramps, abdominal pain, or back pain.  Uterine contractions that are regular, as frequent as six in an hour, regardless of their intensity (may be mild or painful).  Contractions that start on the top of the uterus and spread down to the lower abdomen and back.   A sense of increased pelvic pressure.   A watery or bloody mucus discharge that comes from the vagina.  TREATMENT Depending on the length of the pregnancy and other circumstances, your health care provider may suggest bed rest. If necessary, there are medicines that can be given to stop contractions and   to mature the fetal lungs. If labor happens before 34 weeks of pregnancy, a prolonged hospital stay may be recommended. Treatment depends on the condition of both you and the fetus.  WHAT SHOULD YOU DO IF YOU THINK YOU ARE IN PRETERM LABOR? Call your health care provider right away. You will need to go to the hospital to get checked immediately. HOW CAN YOU PREVENT PRETERM LABOR IN FUTURE PREGNANCIES? You should:   Stop smoking if you smoke.  Maintain healthy weight gain and avoid chemicals and drugs that are not necessary.  Be watchful for any  type of infection.  Inform your health care provider if you have a known history of preterm labor. Document Released: 10/26/2003 Document Revised: 04/07/2013 Document Reviewed: 09/07/2012 ExitCare Patient Information 2015 [employer]. This information is not intended to replace advice given to you by your health care provider. Make sure you discuss any questions you have with your health care provider.  

## 2015-01-30 NOTE — Progress Notes (Signed)
High Risk Pregnancy Diagnosis(es): CHTN G2P0010 [redacted]w[redacted]d Estimated Date of Delivery: 03/07/15 BP 130/90 mmHg  Pulse 72  Wt 190 lb (86.183 kg)  LMP 05/31/2014  Urinalysis: Negative HPI:  Doing well BP, weight, and urine reviewed.  Reports good fm. Denies regular uc's, lof, vb, uti s/s. No complaints.  Fundal Height:  37 Fetal Heart rate:  135, reactive NST Edema:  trace  Reviewed ptl s/s, fkc All questions were answered Assessment: [redacted]w[redacted]d CHTN Medication(s) Plans:  Continue labetalol 100mg  bid and baby asa daily Treatment Plan:  Continue 2x/wk testing nst/sono, IOL @ 39wks Follow up in 3d for high-risk OB appt and bpp/dopp u/s

## 2015-02-01 ENCOUNTER — Other Ambulatory Visit: Payer: Self-pay | Admitting: Obstetrics & Gynecology

## 2015-02-02 ENCOUNTER — Ambulatory Visit (INDEPENDENT_AMBULATORY_CARE_PROVIDER_SITE_OTHER): Payer: BLUE CROSS/BLUE SHIELD

## 2015-02-02 ENCOUNTER — Encounter: Payer: Self-pay | Admitting: Obstetrics & Gynecology

## 2015-02-02 ENCOUNTER — Ambulatory Visit (INDEPENDENT_AMBULATORY_CARE_PROVIDER_SITE_OTHER): Payer: BLUE CROSS/BLUE SHIELD | Admitting: Obstetrics & Gynecology

## 2015-02-02 ENCOUNTER — Other Ambulatory Visit: Payer: Self-pay | Admitting: Women's Health

## 2015-02-02 VITALS — BP 150/90 | HR 76 | Wt 189.4 lb

## 2015-02-02 DIAGNOSIS — Z1389 Encounter for screening for other disorder: Secondary | ICD-10-CM

## 2015-02-02 DIAGNOSIS — O10919 Unspecified pre-existing hypertension complicating pregnancy, unspecified trimester: Secondary | ICD-10-CM

## 2015-02-02 DIAGNOSIS — O10912 Unspecified pre-existing hypertension complicating pregnancy, second trimester: Secondary | ICD-10-CM

## 2015-02-02 DIAGNOSIS — O09893 Supervision of other high risk pregnancies, third trimester: Secondary | ICD-10-CM

## 2015-02-02 DIAGNOSIS — Z331 Pregnant state, incidental: Secondary | ICD-10-CM

## 2015-02-02 DIAGNOSIS — Z3491 Encounter for supervision of normal pregnancy, unspecified, first trimester: Secondary | ICD-10-CM

## 2015-02-02 LAB — POCT URINALYSIS DIPSTICK
Blood, UA: NEGATIVE
Glucose, UA: NEGATIVE
KETONES UA: NEGATIVE
Leukocytes, UA: NEGATIVE
Nitrite, UA: NEGATIVE
PROTEIN UA: NEGATIVE

## 2015-02-02 LAB — US OB FOLLOW UP

## 2015-02-02 NOTE — Progress Notes (Signed)
[place] 35+2WKS,measurements c/w dates, efw 2775g 56.1%,cephalic,BPP 8/8,ant pl gr 3,RI .58,.51, AFI 14.2cm

## 2015-02-02 NOTE — Progress Notes (Signed)
Fetal Surveillance Testing today:  Sonogram with normal fluid reassuring BPP and excellent Doppler ratios   High Risk Pregnancy Diagnosis(es):   Chronic hypertension  G2P0010 [redacted]w[redacted]d Estimated Date of Delivery: 03/07/15  Blood pressure 150/90, pulse 76, weight 189 lb 6.4 oz (85.911 kg), last menstrual period 05/31/2014.  Urinalysis: Negative   HPI: The patient is being seen today for ongoing management of chronic hypertension. Today she reports no problems   BP weight and urine results all reviewed and noted. Patient reports good fetal movement, denies any bleeding and no rupture of membranes symptoms or regular contractions.  Fundal Height:  36 Fetal Heart rate:  145 Edema:  none  Patient is without complaints other than noted in her HPI. All questions were answered.  All lab and sonogram results have been reviewed. Comments: normal   Assessment:  1.  Pregnancy at [redacted]w[redacted]d,  Estimated Date of Delivery: 03/07/15 :                          2.  Chronic Hypertension                        3.    Medication(s) Plans:  Labetalol 100 BID  Treatment Plan:  Twice weekly surveillance  Follow up in 1 week for appointment for high risk OB care,

## 2015-02-06 ENCOUNTER — Other Ambulatory Visit: Payer: BLUE CROSS/BLUE SHIELD | Admitting: Women's Health

## 2015-02-08 ENCOUNTER — Other Ambulatory Visit: Payer: Self-pay | Admitting: Obstetrics & Gynecology

## 2015-02-08 DIAGNOSIS — O10913 Unspecified pre-existing hypertension complicating pregnancy, third trimester: Secondary | ICD-10-CM

## 2015-02-09 ENCOUNTER — Ambulatory Visit (INDEPENDENT_AMBULATORY_CARE_PROVIDER_SITE_OTHER): Payer: BLUE CROSS/BLUE SHIELD | Admitting: Obstetrics & Gynecology

## 2015-02-09 ENCOUNTER — Encounter: Payer: Self-pay | Admitting: Obstetrics & Gynecology

## 2015-02-09 ENCOUNTER — Ambulatory Visit (INDEPENDENT_AMBULATORY_CARE_PROVIDER_SITE_OTHER): Payer: BLUE CROSS/BLUE SHIELD

## 2015-02-09 VITALS — BP 120/90 | HR 72 | Wt 190.0 lb

## 2015-02-09 DIAGNOSIS — O10919 Unspecified pre-existing hypertension complicating pregnancy, unspecified trimester: Secondary | ICD-10-CM

## 2015-02-09 DIAGNOSIS — Z3491 Encounter for supervision of normal pregnancy, unspecified, first trimester: Secondary | ICD-10-CM

## 2015-02-09 DIAGNOSIS — O10912 Unspecified pre-existing hypertension complicating pregnancy, second trimester: Secondary | ICD-10-CM

## 2015-02-09 DIAGNOSIS — I10 Essential (primary) hypertension: Secondary | ICD-10-CM

## 2015-02-09 DIAGNOSIS — O10913 Unspecified pre-existing hypertension complicating pregnancy, third trimester: Secondary | ICD-10-CM | POA: Diagnosis not present

## 2015-02-09 DIAGNOSIS — Z331 Pregnant state, incidental: Secondary | ICD-10-CM

## 2015-02-09 DIAGNOSIS — Z1389 Encounter for screening for other disorder: Secondary | ICD-10-CM

## 2015-02-09 DIAGNOSIS — O09893 Supervision of other high risk pregnancies, third trimester: Secondary | ICD-10-CM

## 2015-02-09 LAB — POCT URINALYSIS DIPSTICK
Blood, UA: NEGATIVE
Glucose, UA: NEGATIVE
KETONES UA: NEGATIVE
Leukocytes, UA: NEGATIVE
Nitrite, UA: NEGATIVE
Protein, UA: NEGATIVE

## 2015-02-09 NOTE — Progress Notes (Signed)
[place] 36+2WKS,BPP 8/8,afi 15.6cm,fht 138bpm,RI .55,.59,ant pl gr3,cephalic

## 2015-02-09 NOTE — Progress Notes (Signed)
Fetal Surveillance Testing today:  BPP 8/8 with good fluid and excellent Dopplers   High Risk Pregnancy Diagnosis(es):   Chronic hypertension  G2P0010 [redacted]w[redacted]d Estimated Date of Delivery: 03/07/15  Blood pressure 120/90, pulse 72, weight 190 lb (86.183 kg), last menstrual period 05/31/2014.  Urinalysis: Negative   HPI: The patient is being seen today for ongoing management of chronic hypertension. Today she reports BP at home have been good 2-3 times per day, 117/80-135/95 on labetalol 100 BID   BP weight and urine results all reviewed and noted. Patient reports good fetal movement, denies any bleeding and no rupture of membranes symptoms or regular contractions.  Fundal Height:  37 Fetal Heart rate:  138 Edema:  none  Patient is without complaints other than noted in her HPI. All questions were answered.  All lab and sonogram results have been reviewed. Comments: normal   Assessment:  1.  Pregnancy at [redacted]w[redacted]d,  Estimated Date of Delivery: 03/07/15 :                          2.  Chronic hypertension                        3.    Medication(s) Plans:  Will increase labetalol 100 mg TID  Treatment Plan:  Continued twice weekly surveillance, continue at home BP 3 times daily  Follow up in Monday  weeks for appointment for high risk OB care, NST i don't see a reason given the very brief up tick in 1 isolated BP to change plans otherwise, she is taking her BP at home

## 2015-02-11 ENCOUNTER — Other Ambulatory Visit: Payer: Self-pay | Admitting: Advanced Practice Midwife

## 2015-02-13 ENCOUNTER — Ambulatory Visit (INDEPENDENT_AMBULATORY_CARE_PROVIDER_SITE_OTHER): Payer: BLUE CROSS/BLUE SHIELD | Admitting: Women's Health

## 2015-02-13 ENCOUNTER — Encounter: Payer: Self-pay | Admitting: Women's Health

## 2015-02-13 VITALS — BP 140/78 | HR 96 | Wt 193.0 lb

## 2015-02-13 DIAGNOSIS — O10912 Unspecified pre-existing hypertension complicating pregnancy, second trimester: Secondary | ICD-10-CM

## 2015-02-13 DIAGNOSIS — Z1389 Encounter for screening for other disorder: Secondary | ICD-10-CM

## 2015-02-13 DIAGNOSIS — Z3491 Encounter for supervision of normal pregnancy, unspecified, first trimester: Secondary | ICD-10-CM

## 2015-02-13 DIAGNOSIS — O10919 Unspecified pre-existing hypertension complicating pregnancy, unspecified trimester: Secondary | ICD-10-CM

## 2015-02-13 DIAGNOSIS — O09893 Supervision of other high risk pregnancies, third trimester: Secondary | ICD-10-CM | POA: Diagnosis not present

## 2015-02-13 DIAGNOSIS — Z118 Encounter for screening for other infectious and parasitic diseases: Secondary | ICD-10-CM

## 2015-02-13 DIAGNOSIS — O10913 Unspecified pre-existing hypertension complicating pregnancy, third trimester: Secondary | ICD-10-CM

## 2015-02-13 DIAGNOSIS — Z331 Pregnant state, incidental: Secondary | ICD-10-CM

## 2015-02-13 DIAGNOSIS — Z3685 Encounter for antenatal screening for Streptococcus B: Secondary | ICD-10-CM

## 2015-02-13 DIAGNOSIS — Z1159 Encounter for screening for other viral diseases: Secondary | ICD-10-CM

## 2015-02-13 LAB — US OB FOLLOW UP

## 2015-02-13 LAB — POCT URINALYSIS DIPSTICK
Blood, UA: NEGATIVE
Glucose, UA: NEGATIVE
Ketones, UA: NEGATIVE
Leukocytes, UA: NEGATIVE
Nitrite, UA: NEGATIVE
PROTEIN UA: NEGATIVE

## 2015-02-13 LAB — OB RESULTS CONSOLE GC/CHLAMYDIA
CHLAMYDIA, DNA PROBE: NEGATIVE
Gonorrhea: NEGATIVE

## 2015-02-13 LAB — OB RESULTS CONSOLE GBS: STREP GROUP B AG: NEGATIVE

## 2015-02-13 NOTE — Patient Instructions (Signed)
Call the office [phone]) or go to [facility] if:  You begin to have strong, frequent contractions  Your water breaks.  Sometimes it is a big gush of fluid, sometimes it is just a trickle that keeps getting your panties wet or running down your legs  You have vaginal bleeding.  It is normal to have a small amount of spotting if your cervix was checked.   You don't feel your baby moving like normal.  If you don't, get you something to eat and drink and lay down and focus on feeling your baby move.  You should feel at least 10 movements in 2 hours.  If you don't, you should call the office or go to [facility].    Call the office [phone]) or go to [facility] hospital for these signs of pre-eclampsia:  Severe headache that does not go away with Tylenol  Visual changes- seeing spots, double, blurred vision  Pain under your right breast or upper abdomen that does not go away with Tums or heartburn medicine  Nausea and/or vomiting  Severe swelling in your hands, feet, and face   [place] Pediatricians/Family Doctors:  [facility] [phone]            [facility] [phone]                 [facility] [phone] (usually not accepting new patients unless you have family there already, you are always welcome to call and ask)            [facility] ([address] [place]) [phone]   [facility] Pediatricians/Family Doctors:   [facility]: [phone]  [facility]: [phone]      Deberah Pelton Contractions Contractions of the uterus can occur throughout pregnancy. Contractions are not always a sign that you are in labor.  WHAT ARE BRAXTON HICKS CONTRACTIONS?  Contractions that occur before labor are called Braxton Hicks contractions, or false labor. Toward the end of pregnancy (32-34 weeks), these contractions can develop more often and may become more forceful. This is not  true labor because these contractions do not result in opening (dilatation) and thinning of the cervix. They are sometimes difficult to tell apart from true labor because these contractions can be forceful and people have different pain tolerances. You should not feel embarrassed if you go to the hospital with false labor. Sometimes, the only way to tell if you are in true labor is for your health care provider to look for changes in the cervix. If there are no prenatal problems or other health problems associated with the pregnancy, it is completely safe to be sent home with false labor and await the onset of true labor. HOW CAN YOU TELL THE DIFFERENCE BETWEEN TRUE AND FALSE LABOR? False Labor 11. The contractions of false labor are usually shorter and not as hard as those of true labor.  12. The contractions are usually irregular.  13. The contractions are often felt in the front of the lower abdomen and in the groin.  14. The contractions may go away when you walk around or change positions while lying down.  15. The contractions get weaker and are shorter lasting as time goes on.  16. The contractions do not usually become progressively stronger, regular, and closer together as with true labor.  True Labor 2. Contractions in true labor last 30-70 seconds, become very regular, usually become more intense, and increase in frequency.  3. The contractions  do not go away with walking.  4. The discomfort is usually felt in the top of the uterus and spreads to the lower abdomen and low back.  5. True labor can be determined by your health care provider with an exam. This will show that the cervix is dilating and getting thinner.  WHAT TO REMEMBER 2. Keep up with your usual exercises and follow other instructions given by your health care provider.  3. Take medicines as directed by your health care provider.  4. Keep your regular prenatal appointments.  5. Eat and drink lightly if you  think you are going into labor.  6. If Braxton Hicks contractions are making you uncomfortable:  1. Change your position from lying down or resting to walking, or from walking to resting.  2. Sit and rest in a tub of warm water.  3. Drink 2-3 glasses of water. Dehydration may cause these contractions.  4. Do slow and deep breathing several times an hour.  WHEN SHOULD I SEEK IMMEDIATE MEDICAL CARE? Seek immediate medical care if: 2. Your contractions become stronger, more regular, and closer together.  3. You have fluid leaking or gushing from your vagina.  4. You have a fever.  5. You pass blood-tinged mucus.  6. You have vaginal bleeding.  7. You have continuous abdominal pain.  8. You have low back pain that you never had before.  9. You feel your baby's head pushing down and causing pelvic pressure.  10. Your baby is not moving as much as it used to.  Document Released: 08/05/2005 Document Revised: 08/10/2013 Document Reviewed: 05/17/2013 [facility] Patient Information 2015 [place], [place]. This information is not intended to replace advice given to you by your health care provider. Make sure you discuss any questions you have with your health care provider.

## 2015-02-13 NOTE — Progress Notes (Signed)
High Risk Pregnancy Diagnosis(es): CHTN G2P0010 3928w6d Estimated Date of Delivery: 03/07/15 BP 140/78 mmHg  Pulse 96  Wt 193 lb (87.544 kg)  LMP 05/31/2014  Urinalysis: Negative HPI:  Doing well, no complaints. Highest home bp 136/95, takes tid. Increased labetalol to 100mg  tid last visit.  BP, weight, and urine reviewed.  Reports good fm. Denies regular uc's, lof, vb, uti s/s.   Fundal Height:  37 Fetal Heart rate:  150, reactive nst Edema:  Trace GBS collected SVE per request: 1/th/-2, posterior, vtx  Reviewed labor s/s, fkc, pre-e s/s. To continue checking bp at home tid- let us know if increasing  All questions were answered Assessment: 6628w6d CHTN Medication(s) Plans:  Continue labetalol 100mg  tid and baby asa daily Treatment Plan:  Continue 2x/wk testing nst alt w/ sono, IOL @ 39wks Follow up in 3d for high-risk OB appt and bpp/dopp

## 2015-02-13 NOTE — Addendum Note (Signed)
Addended by: Cheral MarkerBOOKER, Brenyn Petrey R on: 02/13/2015 04:12 PM   Modules accepted: Orders

## 2015-02-14 ENCOUNTER — Telehealth: Payer: Self-pay | Admitting: Women's Health

## 2015-02-14 ENCOUNTER — Telehealth: Payer: Self-pay | Admitting: *Deleted

## 2015-02-14 LAB — GC/CHLAMYDIA PROBE AMP
Chlamydia trachomatis, NAA: NEGATIVE
NEISSERIA GONORRHOEAE BY PCR: NEGATIVE

## 2015-02-14 MED ORDER — LABETALOL HCL 100 MG PO TABS: 100.0000 mg | ORAL_TABLET | Freq: Three times a day (TID) | ORAL | Status: DC

## 2015-02-14 NOTE — Telephone Encounter (Signed)
Pt requesting refill on Labetalol 100 mg BID.

## 2015-02-14 NOTE — Telephone Encounter (Signed)
Pt states saw Joellyn HaffKim Booker, CNM yesterday and Selena BattenKim checked her cervix, now noticing spotting and thinks she lost her mucus plug. Pt informed can have spotting after having a cervical check,continue to monitor. Also, losing mucus plug is not a definitive sign of true labor. Pt informed of labor precautions and pt verbalized understanding.

## 2015-02-16 ENCOUNTER — Encounter: Payer: Self-pay | Admitting: Advanced Practice Midwife

## 2015-02-16 ENCOUNTER — Other Ambulatory Visit: Payer: Self-pay | Admitting: Women's Health

## 2015-02-16 ENCOUNTER — Ambulatory Visit (INDEPENDENT_AMBULATORY_CARE_PROVIDER_SITE_OTHER): Payer: BLUE CROSS/BLUE SHIELD

## 2015-02-16 ENCOUNTER — Ambulatory Visit (INDEPENDENT_AMBULATORY_CARE_PROVIDER_SITE_OTHER): Payer: BLUE CROSS/BLUE SHIELD | Admitting: Advanced Practice Midwife

## 2015-02-16 VITALS — BP 130/92 | HR 92 | Wt 195.0 lb

## 2015-02-16 DIAGNOSIS — O10919 Unspecified pre-existing hypertension complicating pregnancy, unspecified trimester: Secondary | ICD-10-CM

## 2015-02-16 DIAGNOSIS — O09893 Supervision of other high risk pregnancies, third trimester: Secondary | ICD-10-CM | POA: Diagnosis not present

## 2015-02-16 DIAGNOSIS — O10912 Unspecified pre-existing hypertension complicating pregnancy, second trimester: Secondary | ICD-10-CM | POA: Diagnosis not present

## 2015-02-16 DIAGNOSIS — Z1389 Encounter for screening for other disorder: Secondary | ICD-10-CM

## 2015-02-16 DIAGNOSIS — Z331 Pregnant state, incidental: Secondary | ICD-10-CM

## 2015-02-16 DIAGNOSIS — O10913 Unspecified pre-existing hypertension complicating pregnancy, third trimester: Secondary | ICD-10-CM

## 2015-02-16 LAB — POCT URINALYSIS DIPSTICK
Glucose, UA: NEGATIVE
KETONES UA: NEGATIVE
LEUKOCYTES UA: NEGATIVE
NITRITE UA: NEGATIVE
PROTEIN UA: NEGATIVE

## 2015-02-16 NOTE — Progress Notes (Signed)
[place] 37+2WKS measurements c/w dates, efw 3120g 51.4%,cephalic,ant pl gr 3,BPP 8/8,RI .55,.59..66,AFI 15cm,fht 129bpm

## 2015-02-16 NOTE — Progress Notes (Signed)
Fetal Surveillance Testing today:  BPP/Dopplers/EFW   High Risk Pregnancy Diagnosis(es):   CHTN  G2P0010 796w2d Estimated Date of Delivery: 03/07/15  Last menstrual period 05/31/2014.  Urinalysis: Negative   HPI: The patient is being seen today for ongoing management of pregnancy/CHTN. Today she reports no complaints   BP weight and urine results all reviewed and noted. Patient reports good fetal movement, denies any bleeding and no rupture of membranes symptoms or regular contractions.  Patient is without complaints other than noted in her HPI. All questions were answered.  All lab and sonogram results have been reviewed. Comments: normal dopplers, BPP 8/8; EFW 51%  Assessment:  1.  Pregnancy at 2796w2d,  Estimated Date of Delivery: 03/07/15 :                          2.  CHTN, well controlled on meds                         Medication(s) Plans:  Continue labetalol 100mg  TID  Treatment Plan:  NST mondays, US Thursdays  Follow up in Monday for appointment for high risk OB care, NST

## 2015-02-17 LAB — CULTURE, BETA STREP (GROUP B ONLY): STREP GP B CULTURE: NEGATIVE

## 2015-02-20 LAB — US OB FOLLOW UP

## 2015-02-21 ENCOUNTER — Encounter: Payer: Self-pay | Admitting: Obstetrics & Gynecology

## 2015-02-21 ENCOUNTER — Ambulatory Visit (INDEPENDENT_AMBULATORY_CARE_PROVIDER_SITE_OTHER): Payer: BLUE CROSS/BLUE SHIELD | Admitting: Obstetrics & Gynecology

## 2015-02-21 VITALS — BP 110/70 | HR 100 | Wt 195.0 lb

## 2015-02-21 DIAGNOSIS — O09893 Supervision of other high risk pregnancies, third trimester: Secondary | ICD-10-CM

## 2015-02-21 DIAGNOSIS — Z1389 Encounter for screening for other disorder: Secondary | ICD-10-CM

## 2015-02-21 DIAGNOSIS — O10913 Unspecified pre-existing hypertension complicating pregnancy, third trimester: Secondary | ICD-10-CM

## 2015-02-21 DIAGNOSIS — Z331 Pregnant state, incidental: Secondary | ICD-10-CM

## 2015-02-21 LAB — POCT URINALYSIS DIPSTICK
Blood, UA: NEGATIVE
Glucose, UA: NEGATIVE
KETONES UA: NEGATIVE
LEUKOCYTES UA: NEGATIVE
Nitrite, UA: NEGATIVE
PROTEIN UA: NEGATIVE

## 2015-02-21 NOTE — Progress Notes (Signed)
Pt denies any problems or concerns at this time.  

## 2015-02-22 ENCOUNTER — Telehealth (HOSPITAL_COMMUNITY): Payer: Self-pay | Admitting: *Deleted

## 2015-02-22 NOTE — Telephone Encounter (Signed)
Preadmission screen  

## 2015-02-23 ENCOUNTER — Other Ambulatory Visit: Payer: Self-pay

## 2015-02-24 ENCOUNTER — Encounter: Payer: Self-pay | Admitting: Obstetrics & Gynecology

## 2015-02-24 ENCOUNTER — Encounter (HOSPITAL_COMMUNITY): Payer: Self-pay | Admitting: *Deleted

## 2015-02-24 ENCOUNTER — Inpatient Hospital Stay (HOSPITAL_COMMUNITY)
Admission: AD | Admit: 2015-02-24 | Discharge: 2015-02-28 | DRG: 774 | Disposition: A | Discharge status: Home / Self Care | Payer: BLUE CROSS/BLUE SHIELD | Source: Ambulatory Visit | Attending: Obstetrics and Gynecology | Admitting: Obstetrics and Gynecology

## 2015-02-24 ENCOUNTER — Ambulatory Visit (INDEPENDENT_AMBULATORY_CARE_PROVIDER_SITE_OTHER): Payer: BLUE CROSS/BLUE SHIELD | Admitting: Obstetrics & Gynecology

## 2015-02-24 ENCOUNTER — Ambulatory Visit (INDEPENDENT_AMBULATORY_CARE_PROVIDER_SITE_OTHER): Payer: BLUE CROSS/BLUE SHIELD

## 2015-02-24 VITALS — BP 150/100 | HR 72 | Wt 195.0 lb

## 2015-02-24 DIAGNOSIS — O10919 Unspecified pre-existing hypertension complicating pregnancy, unspecified trimester: Secondary | ICD-10-CM

## 2015-02-24 DIAGNOSIS — O133 Gestational [pregnancy-induced] hypertension without significant proteinuria, third trimester: Secondary | ICD-10-CM | POA: Diagnosis present

## 2015-02-24 DIAGNOSIS — Z833 Family history of diabetes mellitus: Secondary | ICD-10-CM | POA: Diagnosis not present

## 2015-02-24 DIAGNOSIS — O09893 Supervision of other high risk pregnancies, third trimester: Secondary | ICD-10-CM

## 2015-02-24 DIAGNOSIS — O10913 Unspecified pre-existing hypertension complicating pregnancy, third trimester: Secondary | ICD-10-CM

## 2015-02-24 DIAGNOSIS — Z1389 Encounter for screening for other disorder: Secondary | ICD-10-CM

## 2015-02-24 DIAGNOSIS — Z8249 Family history of ischemic heart disease and other diseases of the circulatory system: Secondary | ICD-10-CM

## 2015-02-24 DIAGNOSIS — O368130 Decreased fetal movements, third trimester, not applicable or unspecified: Secondary | ICD-10-CM | POA: Diagnosis present

## 2015-02-24 DIAGNOSIS — Z3A38 38 weeks gestation of pregnancy: Secondary | ICD-10-CM

## 2015-02-24 DIAGNOSIS — O1092 Unspecified pre-existing hypertension complicating childbirth: Principal | ICD-10-CM | POA: Diagnosis present

## 2015-02-24 DIAGNOSIS — Z331 Pregnant state, incidental: Secondary | ICD-10-CM

## 2015-02-24 HISTORY — DX: Anxiety disorder, unspecified: F41.9

## 2015-02-24 LAB — COMPREHENSIVE METABOLIC PANEL
ALT: 15 U/L (ref 14–54)
AST: 20 U/L (ref 15–41)
Albumin: 2.8 g/dL — ABNORMAL LOW (ref 3.5–5.0)
Alkaline Phosphatase: 136 U/L — ABNORMAL HIGH (ref 38–126)
Anion gap: 7 (ref 5–15)
BUN: 10 mg/dL (ref 6–20)
CO2: 19 mmol/L — ABNORMAL LOW (ref 22–32)
Calcium: 8.8 mg/dL — ABNORMAL LOW (ref 8.9–10.3)
Chloride: 106 mmol/L (ref 101–111)
Creatinine, Ser: 0.58 mg/dL (ref 0.44–1.00)
GFR calc Af Amer: >60 mL/min (ref >60)
GFR calc non Af Amer: >60 mL/min (ref >60)
Glucose, Bld: 123 mg/dL — ABNORMAL HIGH (ref 65–99)
Potassium: 3.8 mmol/L (ref 3.5–5.1)
Sodium: 132 mmol/L — ABNORMAL LOW (ref 135–145)
Total Bilirubin: 0.2 mg/dL — ABNORMAL LOW (ref 0.3–1.2)
Total Protein: 6 g/dL — ABNORMAL LOW (ref 6.5–8.1)

## 2015-02-24 LAB — POCT URINALYSIS DIPSTICK
Blood, UA: NEGATIVE
GLUCOSE UA: NEGATIVE
Ketones, UA: NEGATIVE
LEUKOCYTES UA: NEGATIVE
NITRITE UA: NEGATIVE
Protein, UA: NEGATIVE

## 2015-02-24 LAB — CBC
HCT: 32 % — ABNORMAL LOW (ref 36.0–46.0)
Hemoglobin: 10.3 g/dL — ABNORMAL LOW (ref 12.0–15.0)
MCH: 27.6 pg (ref 26.0–34.0)
MCHC: 32.2 g/dL (ref 30.0–36.0)
MCV: 85.8 fL (ref 78.0–100.0)
Platelets: 265 10*3/uL (ref 150–400)
RBC: 3.73 MIL/uL — ABNORMAL LOW (ref 3.87–5.11)
RDW: 14.8 % (ref 11.5–15.5)
WBC: 9.8 10*3/uL (ref 4.0–10.5)

## 2015-02-24 LAB — TYPE AND SCREEN
ABO/RH(D): O POS
Antibody Screen: NEGATIVE

## 2015-02-24 MED ORDER — CITRIC ACID-SODIUM CITRATE 334-500 MG/5ML PO SOLN: 30.0000 mL | ORAL | Status: DC | PRN

## 2015-02-24 MED ORDER — LACTATED RINGERS IV SOLN
INTRAVENOUS | Status: DC
  Administered 2015-02-24 – 2015-02-26 (×9): via INTRAVENOUS

## 2015-02-24 MED ORDER — MISOPROSTOL 50MCG HALF TABLET
50.0000 ug | ORAL_TABLET | ORAL | Status: DC
  Administered 2015-02-24 – 2015-02-25 (×3): 50 ug via ORAL
  Filled 2015-02-24 (×3): qty 0.5

## 2015-02-24 MED ORDER — FENTANYL CITRATE (PF) 100 MCG/2ML IJ SOLN
50.0000 ug | INTRAMUSCULAR | Status: DC | PRN
  Administered 2015-02-25 (×4): 100 ug via INTRAVENOUS
  Administered 2015-02-25: 50 ug via INTRAVENOUS
  Administered 2015-02-25 (×3): 100 ug via INTRAVENOUS
  Filled 2015-02-24 (×9): qty 2

## 2015-02-24 MED ORDER — DIPHENHYDRAMINE HCL 50 MG/ML IJ SOLN
12.5000 mg | INTRAMUSCULAR | Status: DC | PRN
  Administered 2015-02-26 (×2): 12.5 mg via INTRAVENOUS
  Filled 2015-02-24: qty 1

## 2015-02-24 MED ORDER — FLEET ENEMA 7-19 GM/118ML RE ENEM: 1.0000 | ENEMA | RECTAL | Status: DC | PRN

## 2015-02-24 MED ORDER — LABETALOL HCL 5 MG/ML IV SOLN
20.0000 mg | INTRAVENOUS | Status: DC | PRN
Start: 2015-02-24 — End: 2015-02-27

## 2015-02-24 MED ORDER — FENTANYL 2.5 MCG/ML BUPIVACAINE 1/10 % EPIDURAL INFUSION (WH - ANES)
14.0000 mL/h | INTRAMUSCULAR | Status: DC | PRN
Start: 2015-02-24 — End: 2015-02-27
  Administered 2015-02-25 – 2015-02-26 (×5): 14 mL/h via EPIDURAL
  Filled 2015-02-24 (×5): qty 125

## 2015-02-24 MED ORDER — ACETAMINOPHEN 325 MG PO TABS
650.0000 mg | ORAL_TABLET | ORAL | Status: DC | PRN
  Administered 2015-02-26: 650 mg via ORAL
  Filled 2015-02-24: qty 2

## 2015-02-24 MED ORDER — ONDANSETRON HCL 4 MG/2ML IJ SOLN
4.0000 mg | Freq: Four times a day (QID) | INTRAMUSCULAR | Status: DC | PRN
  Administered 2015-02-25: 4 mg via INTRAVENOUS
  Filled 2015-02-24 (×2): qty 2

## 2015-02-24 MED ORDER — EPHEDRINE 5 MG/ML INJ
10.0000 mg | INTRAVENOUS | Status: DC | PRN
  Filled 2015-02-24: qty 2

## 2015-02-24 MED ORDER — OXYTOCIN BOLUS FROM INFUSION
500.0000 mL | INTRAVENOUS | Status: DC
  Administered 2015-02-26: 500 mL via INTRAVENOUS

## 2015-02-24 MED ORDER — LACTATED RINGERS IV SOLN: 500.0000 mL | INTRAVENOUS | Status: DC | PRN

## 2015-02-24 MED ORDER — OXYTOCIN 40 UNITS IN LACTATED RINGERS INFUSION - SIMPLE MED: 62.5000 mL/h | INTRAVENOUS | Status: DC

## 2015-02-24 MED ORDER — LIDOCAINE HCL (PF) 1 % IJ SOLN
30.0000 mL | INTRAMUSCULAR | Status: DC | PRN
  Filled 2015-02-24: qty 30

## 2015-02-24 MED ORDER — TERBUTALINE SULFATE 1 MG/ML IJ SOLN: 0.2500 mg | Freq: Once | INTRAMUSCULAR | Status: AC | PRN

## 2015-02-24 MED ORDER — PHENYLEPHRINE 40 MCG/ML (10ML) SYRINGE FOR IV PUSH (FOR BLOOD PRESSURE SUPPORT)
80.0000 ug | PREFILLED_SYRINGE | INTRAVENOUS | Status: DC | PRN
  Filled 2015-02-24: qty 2
  Filled 2015-02-24: qty 20

## 2015-02-24 MED ORDER — LABETALOL HCL 100 MG PO TABS
100.0000 mg | ORAL_TABLET | Freq: Once | ORAL | Status: AC
  Administered 2015-02-24: 100 mg via ORAL
  Filled 2015-02-24: qty 1

## 2015-02-24 MED ORDER — ZOLPIDEM TARTRATE 5 MG PO TABS
5.0000 mg | ORAL_TABLET | Freq: Every evening | ORAL | Status: DC | PRN
Start: 2015-02-24 — End: 2015-02-27
  Administered 2015-02-24: 5 mg via ORAL
  Filled 2015-02-24: qty 1

## 2015-02-24 MED ORDER — OXYCODONE-ACETAMINOPHEN 5-325 MG PO TABS: 2.0000 | ORAL_TABLET | ORAL | Status: DC | PRN

## 2015-02-24 MED ORDER — OXYCODONE-ACETAMINOPHEN 5-325 MG PO TABS: 1.0000 | ORAL_TABLET | ORAL | Status: DC | PRN

## 2015-02-24 NOTE — Plan of Care (Signed)
Problem: Phase I Progression Outcomes Goal: Assess per MD/Nurse,Routine-VS,FHR,UC,Head to Toe assess Outcome: Progressing Pt being induced for CHTN. Discussed s/s of preeclampsia. Pt denies and HA, Blurred vision, scatoma. Will give patient Preeclampsia Education Tool and review with her.

## 2015-02-24 NOTE — H&P (Signed)
Tina Weber is a 34 y.o. female presenting for IOL for Chronic hypertension /gestational hypertension @ 38+2. BPP 4/8 at the office today. Fetal movement currently normal though decreased movement earlier today. She has had good prenatal care since 7w gestation. Pt takes no medications except prenatal gummy vitamins and labetalol  TID. She discontinued Xanax and Wellbutrin when she found she was pregnant. She has a history This was a planned pregnancy, and pt and partner named multiple support systems in the area.   Hx of seizure disorder, has been off keppra since 2014 with no seizures since.  Maternal Medical History:  Reason for admission: IOL for gestational hypertension  Contractions: Frequency: irregular and rare.    Fetal activity: Perceived fetal activity is normal.   Last perceived fetal movement was within the past hour.    Prenatal complications: PIH.   Seizures- last one over 1 year ago   .  [facility]  FOB hetvi shawhan 34 yo wm 1st  Dating By LMP and [place]  Pap 07/19/14 negative for malignancy +HPV  GC/CT Initial: -/- 36+wks: -/-  Genetic Screen NT/IT: neg  CF screen negative  Anatomic [place] normal  Flu vaccine   Tdap Recommended ~ 28wks  Glucose Screen  2 hr 82/125/127  GBS Negative  Feed Preference breast  Contraception nexplanon vs. iud  Circumcision n/a  Childbirth Classes discussed  Pediatrician Undecided, info given      OB History    Gravida Para Term Preterm AB TAB SAB Ectopic Multiple Living   Past Medical History  Diagnosis Date  . Seizures     Last seizure almost one year ago  . Depression   . Supervision of normal pregnancy in first trimester 07/19/2014     [facility] FOB lummie montijo 34 yo wm 1st Dating By LMP and [place] Pap  07/19/14 GC/CT Initial:                36+wks: Genetic Screen NT/IT:  CF screen  Anatomic [place]  Flu vaccine  Tdap Recommended ~ 28wks Glucose Screen  2 hr GBS  Feed  Preference  Contraception  Circumcision  Childbirth Classes  Pediatrician     Past Surgical History  Procedure Laterality Date  . Wisdom tooth extraction    . Cholecystectomy     Family History: family history includes Cancer in her mother; Congestive Heart Failure in her maternal grandmother; Diabetes in her maternal grandfather; Hypertension in her father. Social History:  reports that she has never smoked. She has never used smokeless tobacco. She reports that she does not drink alcohol or use illicit drugs.   Prenatal Transfer Tool  Maternal Diabetes: No Genetic Screening: Normal Maternal Ultrasounds/Referrals: Normal Fetal Ultrasounds or other Referrals:  None Maternal Substance Abuse:  No Significant Maternal Medications:  None Significant Maternal Lab Results:  None Other Comments:  gestational hypertension  Review of Systems  Eyes: Negative for blurred vision.  Gastrointestinal: Negative for abdominal pain.  Neurological: Negative for headaches.  All other systems reviewed and are negative.   Dilation: 1 Effacement (%): 50 Station: -3 Exam by:: Tina Weber CNM  Blood pressure 144/99, pulse 109, temperature 98.3 F (36.8 C), temperature source Oral, resp. rate 18, height  (1.626 m), weight 88.451 kg (195 lb), last menstrual period 05/31/2014. Maternal Exam:  Uterine Assessment: Contraction frequency is rare.   Abdomen: Patient reports no abdominal tenderness. Fetal presentation:  vertex  Introitus: Normal vulva. Normal vagina.  Cervix: Cervix evaluated by digital exam.     Fetal Exam Fetal Monitor Review: Mode: ultrasound.   Variability: moderate (6-25 bpm).   Pattern: accelerations present and no decelerations.    Fetal State Assessment: Category I - tracings are normal.     Physical Exam  Constitutional: She is oriented to person, place, and time. She appears well-developed and well-nourished.  HENT:  Head: Normocephalic and atraumatic.  Eyes:  Conjunctivae and EOM are normal.  Neck: Normal range of motion.  Cardiovascular: Normal rate.   Respiratory: Effort normal. No respiratory distress.  GI: Soft. She exhibits no distension. There is no tenderness.  Musculoskeletal: Normal range of motion. She exhibits no edema.  Neurological: She is alert and oriented to person, place, and time.  Skin: Skin is warm and dry. No erythema.    General:  alert, cooperative and appears stated age  Skin:  normal  HEENT: extra ocular movement intact  Lungs:  clear to auscultation bilaterally  Heart:  regular rate and rhythm, S1, S2 normal, no murmur, click, rub or gallop        Pelvis: Exam deferred.  FHR: 164 BPM         Prenatal labs: ABO, Rh: O/POS/-- (12/01 1555) Antibody: Negative (04/26 0932) Rubella: 0.51 (12/01 1555) RPR: Non Reactive (04/26 0932)  HBsAg: NEGATIVE (12/01 1555)  HIV: NONREACTIVE (12/01 1555)  GBS: Negative (06/27 0000)    Assessment/Plan: This is a 34 y.o. G2P0010 at 223w3d with chronic HTN and decreased fetal movement at prenatal visit. Plan to induce labor. Given unfavorable cervix, plan to start cytotec and follow.  FHR is currently elevated. Plan to follow closely.   Weber,Tina Grissett 02/24/2015, 3:30 PM  H&P, by Tina FiscalLori, I have reviewed and addended the physical exam accordingly.  Tina Weber,Tina Weber ROCIO, MD 9:46 PM

## 2015-02-24 NOTE — Progress Notes (Signed)
Fetal Surveillance Testing today:  BPP 4/8 with excellent Doppler flow, normal fluid   High Risk Pregnancy Diagnosis(es):   Chronic Hypertension  G2P0010 7158w3d Estimated Date of Delivery: 03/07/15  Blood pressure 150/100, pulse 72, weight 195 lb (88.451 kg), last menstrual period 05/31/2014.  Urinalysis: Negative   HPI: The patient is being seen today for ongoing management of chronic hypertension. Today she reports less movement today which is a change   BP weight and urine results all reviewed and noted. Patient reports good fetal movement, denies any bleeding and no rupture of membranes symptoms or regular contractions.  Fundal Height:  37 Fetal Heart rate:  127 Edema:  none  Patient is without complaints other than noted in her HPI. All questions were answered.  All lab and sonogram results have been reviewed. Comments: abnormal: poor BPP   Assessment:  1.  Pregnancy at 7358w3d,  Estimated Date of Delivery: 03/07/15 :                          2.  Chronic Hypertension                        3.  Poor antepartum testing  Medication(s) Plans:  Labetalol 100 TID  Treatment Plan:  Begin cervical ripening today, followed by induction  Follow up in 6 weeks for appointment for high risk OB care, pp exam

## 2015-02-24 NOTE — Progress Notes (Addendum)
[place] 38+3wks,BPP 4/8 no movement/tone,AFI 14cm,fht 127bpm,cephalic, ant pl gr 3,RI .51,.54

## 2015-02-24 NOTE — Progress Notes (Signed)
L. Clemmons CNM aware of elevated pt. Pt w/o s/s of Preeclampsia. Will cont to monitor.

## 2015-02-24 NOTE — Progress Notes (Signed)
Reported to Philipp DeputyKim Shaw CNM that patient is having no pain, on intermittent monitoring. She received her second PO cytotec at 8pm and will be due again at midnight. Clelia CroftShaw CNM ordered if patient isn't hurting or contracting at midnight to give a 3rd dose of PO cytotec.

## 2015-02-25 ENCOUNTER — Encounter (HOSPITAL_COMMUNITY): Payer: Self-pay | Admitting: Anesthesiology

## 2015-02-25 ENCOUNTER — Inpatient Hospital Stay (HOSPITAL_COMMUNITY): Payer: BLUE CROSS/BLUE SHIELD | Admitting: Anesthesiology

## 2015-02-25 LAB — CBC
HCT: 31.7 % — ABNORMAL LOW (ref 36.0–46.0)
HCT: 32.2 % — ABNORMAL LOW (ref 36.0–46.0)
HCT: 32.1 % — ABNORMAL LOW (ref 36.0–46.0)
HEMOGLOBIN: 10.3 g/dL — AB (ref 12.0–15.0)
HEMOGLOBIN: 10.5 g/dL — AB (ref 12.0–15.0)
HEMOGLOBIN: 10.7 g/dL — AB (ref 12.0–15.0)
MCH: 28 pg (ref 26.0–34.0)
MCH: 27.9 pg (ref 26.0–34.0)
MCH: 28.5 pg (ref 26.0–34.0)
MCHC: 32.5 g/dL (ref 30.0–36.0)
MCHC: 32.6 g/dL (ref 30.0–36.0)
MCHC: 33.3 g/dL (ref 30.0–36.0)
MCV: 86.1 fL (ref 78.0–100.0)
MCV: 85.6 fL (ref 78.0–100.0)
MCV: 85.4 fL (ref 78.0–100.0)
PLATELETS: 233 10*3/uL (ref 150–400)
Platelets: 246 10*3/uL (ref 150–400)
Platelets: 263 10*3/uL (ref 150–400)
RBC: 3.68 MIL/uL — AB (ref 3.87–5.11)
RBC: 3.76 MIL/uL — ABNORMAL LOW (ref 3.87–5.11)
RBC: 3.76 MIL/uL — ABNORMAL LOW (ref 3.87–5.11)
RDW: 15 % (ref 11.5–15.5)
RDW: 14.7 % (ref 11.5–15.5)
RDW: 14.9 % (ref 11.5–15.5)
WBC: 10.5 10*3/uL (ref 4.0–10.5)
WBC: 10.2 10*3/uL (ref 4.0–10.5)
WBC: 12.3 10*3/uL — ABNORMAL HIGH (ref 4.0–10.5)

## 2015-02-25 LAB — RPR: RPR Ser Ql: NONREACTIVE

## 2015-02-25 LAB — HIV ANTIBODY (ROUTINE TESTING W REFLEX): HIV Screen 4th Generation wRfx: NONREACTIVE

## 2015-02-25 MED ORDER — NALBUPHINE HCL 10 MG/ML IJ SOLN
10.0000 mg | INTRAMUSCULAR | Status: DC | PRN
  Administered 2015-02-25: 10 mg via INTRAVENOUS
  Filled 2015-02-25: qty 1

## 2015-02-25 MED ORDER — NALBUPHINE HCL 10 MG/ML IJ SOLN
10.0000 mg | INTRAMUSCULAR | Status: DC | PRN
  Administered 2015-02-25: 10 mg via INTRAMUSCULAR
  Filled 2015-02-25: qty 1

## 2015-02-25 MED ORDER — MISOPROSTOL 25 MCG QUARTER TABLET
25.0000 ug | ORAL_TABLET | Freq: Once | ORAL | Status: AC
  Administered 2015-02-25: 25 ug via VAGINAL
  Filled 2015-02-25: qty 0.25

## 2015-02-25 MED ORDER — TERBUTALINE SULFATE 1 MG/ML IJ SOLN: 0.2500 mg | Freq: Once | INTRAMUSCULAR | Status: AC | PRN

## 2015-02-25 MED ORDER — MISOPROSTOL 25 MCG QUARTER TABLET: 25.0000 ug | ORAL_TABLET | Freq: Once | ORAL | Status: DC

## 2015-02-25 MED ORDER — OXYTOCIN 40 UNITS IN LACTATED RINGERS INFUSION - SIMPLE MED
1.0000 m[IU]/min | INTRAVENOUS | Status: DC
  Administered 2015-02-25: 1 m[IU]/min via INTRAVENOUS
  Filled 2015-02-25: qty 1000

## 2015-02-25 MED ORDER — PROMETHAZINE HCL 25 MG/ML IJ SOLN
25.0000 mg | Freq: Four times a day (QID) | INTRAMUSCULAR | Status: DC | PRN
  Administered 2015-02-25: 25 mg via INTRAMUSCULAR
  Filled 2015-02-25: qty 1

## 2015-02-25 MED ORDER — LIDOCAINE HCL (PF) 1 % IJ SOLN
INTRAMUSCULAR | Status: DC | PRN
  Administered 2015-02-25 (×2): 9 mL via EPIDURAL

## 2015-02-25 NOTE — Progress Notes (Signed)
1645 foley attempt by CNM-cervix very posterior. Unable to place. Late entry. 0950 foley attempt by CNM. Cervix very posterior. Unable to place.

## 2015-02-25 NOTE — Anesthesia Procedure Notes (Signed)
Epidural Patient location during procedure: OB Start time: 02/25/2015 11:19 PM End time: 02/25/2015 11:27 PM  Staffing Anesthesiologist: Leilani AbleHATCHETT, Joda Braatz Performed by: anesthesiologist   Preanesthetic Checklist Completed: patient identified, surgical consent, pre-op evaluation, timeout performed, IV checked, risks and benefits discussed and monitors and equipment checked  Epidural Patient position: sitting Prep: site prepped and draped and DuraPrep Patient monitoring: continuous pulse ox and blood pressure Approach: midline Location: L3-L4 Injection technique: LOR air  Needle:  Needle type: Tuohy  Needle gauge: 17 G Needle length: 9 cm and 9 Needle insertion depth: 6 cm Catheter type: closed end flexible Catheter size: 19 Gauge Catheter at skin depth: 11 cm Test dose: negative and Other  Assessment Sensory level: T10 Events: blood not aspirated, injection not painful, no injection resistance, negative IV test and paresthesia  Additional Notes L leg X 1Reason for block:procedure for pain

## 2015-02-25 NOTE — Progress Notes (Signed)
6x 2 cm new area bleeding noted on towel. Towel replaced.

## 2015-02-25 NOTE — Progress Notes (Signed)
Labor Progress Note  S: currently on pitocin, s/p FB attempt x 2  O:  BP 151/97 mmHg  Pulse 76  Temp(Src) 98.3 F (36.8 C) (Oral)  Resp 18  Ht 5\' 4"  (1.626 m)  Wt 195 lb (88.451 kg)  BMI 33.46 kg/m2  LMP 05/31/2014 Cat I CVE:  2030: 1.5/60/-2, anterior, moderate consistency  A&P: 34 y.o. G2P0010 7435w4d IOL 2/2 BPP 4/8, cHTN s/p cytotec x 3 #labor: progressing slowly, has been on pitocin 1200 today.  Discussed pitocin break, pt declines and requests to continue.  FB placed this exam.  Likely d/c pitocin at 0500 if minimal change.  Perry MountACOSTA,Rizwan Kuyper ROCIO, MD 9:40 PM

## 2015-02-25 NOTE — Progress Notes (Signed)
Pt called out from bathroom with reports of bleeding. Pt sitting on toilet with bright red blood dripping from vagina into toilet. Appears to be small amount. 2 drops noted on floor. Pt assisted to bed. Towel between legs to monitor for bleeding.

## 2015-02-25 NOTE — Progress Notes (Signed)
Tina Weber Tina Weber is a 34 y.o. G2P0010 at 2067w4d by  admitted for induction of labor due to Chronic Hypertension.  Subjective: Doing well. B/P's Stable  Objective: BP 133/72 mmHg  Pulse 103  Temp(Src) 97.8 F (36.6 C) (Axillary)  Resp 18  Ht 5\' 4"  (1.626 m)  Wt 88.451 kg (195 lb)  BMI 33.46 kg/m2  LMP 05/31/2014      FHT:  Category 1 UC:   rare SVE:   Dilation: 1.5 Effacement (%): 60 Station: -3 Exam by:: j.johnson,RN  Labs: Lab Results  Component Value Date   WBC 10.5 02/25/2015   HGB 10.3* 02/25/2015   HCT 31.7* 02/25/2015   MCV 86.1 02/25/2015   PLT 246 02/25/2015    Assessment / Plan: IUP @ 38+4 IOL for CHTN  Labor: Cytotec x 5; vaginal dose this time; tried to place Foley Bulb; unsucessful Preeclampsia:   Fetal Wellbeing:  Category I Pain Control:  Labor support without medications I/Tina:  GBS Neg Anticipated MOD:  NSVD  Tina Weber 02/25/2015, 9:50 AM

## 2015-02-25 NOTE — Anesthesia Preprocedure Evaluation (Signed)
Anesthesia Evaluation  Patient identified by MRN, date of birth, ID band Patient awake    Reviewed: Allergy & Precautions, H&P , NPO status , Patient's Chart, lab work & pertinent test results  Airway Mallampati: I  TM Distance: >3 FB Neck ROM: full    Dental no notable dental hx.    Pulmonary neg pulmonary ROS,    Pulmonary exam normal       Cardiovascular Normal cardiovascular exam    Neuro/Psych    GI/Hepatic negative GI ROS, Neg liver ROS,   Endo/Other  negative endocrine ROS  Renal/GU negative Renal ROS     Musculoskeletal   Abdominal (+) + obese,   Peds  Hematology negative hematology ROS (+)   Anesthesia Other Findings   Reproductive/Obstetrics (+) Pregnancy                             Anesthesia Physical Anesthesia Plan  ASA: II  Anesthesia Plan: Epidural   Post-op Pain Management:    Induction:   Airway Management Planned:   Additional Equipment:   Intra-op Plan:   Post-operative Plan:   Informed Consent: I have reviewed the patients History and Physical, chart, labs and discussed the procedure including the risks, benefits and alternatives for the proposed anesthesia with the patient or authorized representative who has indicated his/her understanding and acceptance.     Plan Discussed with:   Anesthesia Plan Comments:         Anesthesia Quick Evaluation

## 2015-02-26 ENCOUNTER — Encounter (HOSPITAL_COMMUNITY): Payer: Self-pay | Admitting: *Deleted

## 2015-02-26 DIAGNOSIS — Z3A38 38 weeks gestation of pregnancy: Secondary | ICD-10-CM

## 2015-02-26 DIAGNOSIS — O1092 Unspecified pre-existing hypertension complicating childbirth: Secondary | ICD-10-CM

## 2015-02-26 DIAGNOSIS — O368130 Decreased fetal movements, third trimester, not applicable or unspecified: Secondary | ICD-10-CM

## 2015-02-26 MED ORDER — IBUPROFEN 600 MG PO TABS
600.0000 mg | ORAL_TABLET | Freq: Four times a day (QID) | ORAL | Status: DC
  Administered 2015-02-27 – 2015-02-28 (×6): 600 mg via ORAL
  Filled 2015-02-26 (×6): qty 1

## 2015-02-26 MED ORDER — LABETALOL HCL 100 MG PO TABS
100.0000 mg | ORAL_TABLET | Freq: Three times a day (TID) | ORAL | Status: DC
  Administered 2015-02-26 (×3): 100 mg via ORAL
  Filled 2015-02-26 (×4): qty 1

## 2015-02-26 MED ORDER — HYDRALAZINE HCL 20 MG/ML IJ SOLN
10.0000 mg | Freq: Once | INTRAMUSCULAR | Status: AC
  Administered 2015-02-26: 10 mg via INTRAVENOUS
  Filled 2015-02-26: qty 1

## 2015-02-26 NOTE — Progress Notes (Signed)
Tina Weber is a 34 y.o. G2P0010 at 1010w5d by ultrasound admitted for induction of labor due to Hypertension.  Subjective:  Pushing with contractions for 1.5hrs   Objective: BP 146/75 mmHg  Pulse 107  Temp(Src) 98.3 F (36.8 C) (Oral)  Resp 18  Ht 5\' 4"  (1.626 m)  Wt 195 lb (88.451 kg)  BMI 33.46 kg/m2  LMP 05/31/2014 I/O last 3 completed shifts: In: -  Out: 2250 [Urine:2250]    FHT:  FHR: 130 bpm, variability: moderate,  accelerations:  Present,  decelerations:  Present variables UC:   regular, every 2 minutes SVE:   Dilation: 10 Effacement (%): 100 Station: 0, +1 Exam by:: Rogelio SeenMcCall, RN   Labs: Lab Results  Component Value Date   WBC 12.3* 02/25/2015   HGB 10.7* 02/25/2015   HCT 32.1* 02/25/2015   MCV 85.4 02/25/2015   PLT 263 02/25/2015    Assessment / Plan: [facility],  progressing well on pitocin  Labor: Progressing normally Preeclampsia:  no signs or symptoms of toxicity, intake and ouput balanced and labs stable Fetal Wellbeing:  Category I Pain Control:  Epidural I/D:  n/a Anticipated MOD:  NSVD   Erasmo DownerAngela M Bacigalupo, MD, MPH PGY-2,  [facility] 02/26/2015 9:52 PM

## 2015-02-26 NOTE — Progress Notes (Signed)
Labor Progress Note  S: currently on pitocin, s/p FB attempt x 2  O:  BP 144/84 mmHg  Pulse 81  Temp(Src) 98.3 F (36.8 C) (Oral)  Resp 18  Ht 5\' 4"  (1.626 m)  Wt 195 lb (88.451 kg)  BMI 33.46 kg/m2  LMP 05/31/2014 Cat I CVE:  2030: 1.5/60/-2, anterior, moderate consistency 0030: 45/80/-2, moderate consistency  A&P: 34 y.o. G2P0010 3542w4d IOL 2/2 BPP 4/8, cHTN s/p cytotec x 3 #labor: progressing well, continue to increase pitocin.  AROM, IUPC/FSE placed 2/2 difficulty tracing baby.  Likely already ruptured previously however.  Perry MountACOSTA,Karey Suthers ROCIO, MD 1:10 AM

## 2015-02-26 NOTE — Progress Notes (Signed)
Robet LeuJennifer D Boyington is a 34 y.o. G2P0010 at 8569w5d by ultrasound admitted for induction of labor due to Hypertension.  Subjective:   Objective: BP 148/92 mmHg  Pulse 95  Temp(Src) 98.2 F (36.8 C) (Oral)  Resp 18  Ht 5\' 4"  (1.626 m)  Wt 195 lb (88.451 kg)  BMI 33.46 kg/m2  LMP 05/31/2014 I/O last 3 completed shifts: In: -  Out: 1050 [Urine:1050] Total I/O In: -  Out: 1200 [Urine:1200]  FHT:  FHR: 120 bpm, variability: moderate,  accelerations:  Present,  decelerations:  Absent UC:   regular, every 2 minutes SVE:   Dilation: 9 Effacement (%): 80 Station: +1 Exam by:: darlene Arlene Genova cnm  Labs: Lab Results  Component Value Date   WBC 12.3* 02/25/2015   HGB 10.7* 02/25/2015   HCT 32.1* 02/25/2015   MCV 85.4 02/25/2015   PLT 263 02/25/2015    Assessment / Plan: In[facility] of [facility],  progressing well on pitocin  Labor: Progressing normally Preeclampsia:  no signs or symptoms of toxicity, intake and ouput balanced and labs stable Fetal Wellbeing:  Category I Pain Control:  Epidural I/D:  n/a Anticipated MOD:  NSVD  Biana Haggar DARLENE 02/26/2015, 5:50 PM

## 2015-02-26 NOTE — Progress Notes (Signed)
Tina Weber is a 34 y.o. G2P0010 at 7557w5d by LMP admitted for induction of labor due to Hypertension.  Subjective:   Objective: BP 155/84 mmHg  Pulse 104  Temp(Src) 98.3 F (36.8 C) (Oral)  Resp 18  Ht 5\' 4"  (1.626 m)  Wt 195 lb (88.451 kg)  BMI 33.46 kg/m2  LMP 05/31/2014 I/O last 3 completed shifts: In: -  Out: 1050 [Urine:1050] Total I/O In: -  Out: 1200 [Urine:1200]  FHT:  FHR: 140-150 bpm, variability: moderate,  accelerations:  Present,  decelerations:  Absent UC:   regular, every 2-3 minutes SVE:   Dilation: 4.5 Effacement (%): 80 Station: -2 Exam by:: D Clenton Esper CNM  Labs: Lab Results  Component Value Date   WBC 12.3* 02/25/2015   HGB 10.7* 02/25/2015   HCT 32.1* 02/25/2015   MCV 85.4 02/25/2015   PLT 263 02/25/2015    Assessment / Plan: [facility],  progressing well on pitocin  Labor: Progressing normally Preeclampsia:  no signs or symptoms of toxicity Fetal Wellbeing:  Category I Pain Control:  Labor support without medications I/D:  n/a Anticipated MOD:  NSVD  Tina Weber 02/26/2015, 1:26 PM

## 2015-02-26 NOTE — Progress Notes (Signed)
Tina LeuJennifer D Weber is a 34 y.o. G2P0010 at 4065w5d by ultrasound admitted for induction of labor due to Hypertension.  Subjective:   Objective: BP 143/90 mmHg  Pulse 101  Temp(Src) 98.6 F (37 C) (Axillary)  Resp 18  Ht 5\' 4"  (1.626 m)  Wt 195 lb (88.451 kg)  BMI 33.46 kg/m2  LMP 05/31/2014 I/O last 3 completed shifts: In: -  Out: 1050 [Urine:1050]    FHT:  FHR: 130's bpm, variability: moderate,  accelerations:  Present,  decelerations:  Absent UC:   Mild occasional SVE:   Dilation: 4.5 Effacement (%): 80 Station: -2 Exam by:: n licato rn  Labs: Lab Results  Component Value Date   WBC 12.3* 02/25/2015   HGB 10.7* 02/25/2015   HCT 32.1* 02/25/2015   MCV 85.4 02/25/2015   PLT 263 02/25/2015    Assessment / Plan: Induction of labor due to HTN,  progressing well on pitocin  Labor: slow progress Preeclampsia:  no signs or symptoms of toxicity Fetal Wellbeing:  Category I Pain Control:  Labor support without medications I/D:  n/a Anticipated MOD:  NSVD  Tina Weber 02/26/2015, 10:15 AM

## 2015-02-27 ENCOUNTER — Inpatient Hospital Stay (HOSPITAL_COMMUNITY): Admission: RE | Admit: 2015-02-27 | Payer: BLUE CROSS/BLUE SHIELD | Source: Ambulatory Visit

## 2015-02-27 MED ORDER — WITCH HAZEL-GLYCERIN EX PADS: 1.0000 "application " | MEDICATED_PAD | CUTANEOUS | Status: DC | PRN

## 2015-02-27 MED ORDER — ZOLPIDEM TARTRATE 5 MG PO TABS: 5.0000 mg | ORAL_TABLET | Freq: Every evening | ORAL | Status: DC | PRN

## 2015-02-27 MED ORDER — SIMETHICONE 80 MG PO CHEW: 80.0000 mg | CHEWABLE_TABLET | ORAL | Status: DC | PRN

## 2015-02-27 MED ORDER — LANOLIN HYDROUS EX OINT: TOPICAL_OINTMENT | CUTANEOUS | Status: DC | PRN

## 2015-02-27 MED ORDER — DIBUCAINE 1 % RE OINT: 1.0000 "application " | TOPICAL_OINTMENT | RECTAL | Status: DC | PRN

## 2015-02-27 MED ORDER — OXYCODONE-ACETAMINOPHEN 5-325 MG PO TABS
2.0000 | ORAL_TABLET | ORAL | Status: DC | PRN
  Administered 2015-02-27 – 2015-02-28 (×4): 2 via ORAL
  Filled 2015-02-27 (×4): qty 2

## 2015-02-27 MED ORDER — BENZOCAINE-MENTHOL 20-0.5 % EX AERO
1.0000 "application " | INHALATION_SPRAY | CUTANEOUS | Status: DC | PRN
  Administered 2015-02-27: 1 via TOPICAL
  Filled 2015-02-27: qty 56

## 2015-02-27 MED ORDER — TETANUS-DIPHTH-ACELL PERTUSSIS 5-2.5-18.5 LF-MCG/0.5 IM SUSP
0.5000 mL | Freq: Once | INTRAMUSCULAR | Status: AC
  Administered 2015-02-27: 0.5 mL via INTRAMUSCULAR
  Filled 2015-02-27: qty 0.5

## 2015-02-27 MED ORDER — ONDANSETRON HCL 4 MG/2ML IJ SOLN: 4.0000 mg | INTRAMUSCULAR | Status: DC | PRN

## 2015-02-27 MED ORDER — SENNOSIDES-DOCUSATE SODIUM 8.6-50 MG PO TABS
2.0000 | ORAL_TABLET | ORAL | Status: DC
  Administered 2015-02-27: 2 via ORAL
  Filled 2015-02-27: qty 2

## 2015-02-27 MED ORDER — ACETAMINOPHEN 325 MG PO TABS: 650.0000 mg | ORAL_TABLET | ORAL | Status: DC | PRN

## 2015-02-27 MED ORDER — DIPHENHYDRAMINE HCL 25 MG PO CAPS: 25.0000 mg | ORAL_CAPSULE | Freq: Four times a day (QID) | ORAL | Status: DC | PRN

## 2015-02-27 MED ORDER — ONDANSETRON HCL 4 MG PO TABS
4.0000 mg | ORAL_TABLET | ORAL | Status: DC | PRN
  Administered 2015-02-28: 4 mg via ORAL
  Filled 2015-02-27: qty 1

## 2015-02-27 MED ORDER — OXYCODONE-ACETAMINOPHEN 5-325 MG PO TABS
1.0000 | ORAL_TABLET | ORAL | Status: DC | PRN
  Administered 2015-02-27 (×2): 1 via ORAL
  Filled 2015-02-27 (×2): qty 1

## 2015-02-27 MED ORDER — PRENATAL MULTIVITAMIN CH
1.0000 | ORAL_TABLET | Freq: Every day | ORAL | Status: DC
  Administered 2015-02-27 – 2015-02-28 (×2): 1 via ORAL
  Filled 2015-02-27 (×2): qty 1

## 2015-02-27 NOTE — Progress Notes (Signed)
Post Partum Day 1 Subjective: no complaints, up ad lib, voiding, tolerating PO and + flatus  Objective: Blood pressure 141/73, pulse 91, temperature 97.9 F (36.6 C), temperature source Oral, resp. rate 20, height 5\' 4"  (1.626 m), weight 195 lb (88.451 kg), last menstrual period 05/31/2014, SpO2 100 %, unknown if currently breastfeeding.  Physical Exam:  General: alert, cooperative, appears stated age and no distress Lochia: appropriate Uterine Fundus: firm Incision: healing well DVT Evaluation: No evidence of DVT seen on physical exam. Negative Homan's sign. No cords or calf tenderness.   Recent Labs  02/25/15 1040 02/25/15 2240  HGB 10.5* 10.7*  HCT 32.2* 32.1*    Assessment/Plan: Plan for discharge tomorrow   LOS: 3 days   Dewey Viens DARLENE 02/27/2015, 7:22 AM

## 2015-02-27 NOTE — Clinical Social Work Maternal (Signed)
CLINICAL SOCIAL WORK MATERNAL/CHILD NOTE  Patient Details  Name: MAKENNAH OMURA MRN: [id number] Date of Birth: Mar 27, 1981  Date:  02/27/2015  Clinical Social Worker Initiating Note:  Loleta Books, LCSW Date/ Time Initiated:  02/27/15/1130     Child's Name:  Reuel Derby   Legal Guardian:  Victorino Dike and Maralyn Witherell (parents)  Need for Interpreter:  None   Date of Referral:  02/27/15     Reason for Referral:  History of depression and anxiety  Referral Source:  [facility]   Address:  [address] [place], [place] [address]  Phone number:  [phone]   Household Members:  Spouse   Natural Supports (not living in the home):  Extended Family, Immediate Family   Professional Supports: None   Employment: Full-time   Type of Work:   N/A  Education:    N/A  Architect:  Media planner   Other Resources:    None identified  Cultural/Religious Considerations Which May Impact Care:  None reported  Strengths:  Ability to meet basic needs , Home prepared for child    Risk Factors/Current Problems:   1)Mental Health Concerns: MOB presents with history of depression and anxiety. MOB is currently prescribed Wellbutrin. She stated that she reduced dose during the pregnancy, and has felt "great".  MOB reported intention to continue mediation as she transitions to the postpartum period.   Cognitive State:  Able to Concentrate , Alert , Insightful , Goal Oriented , Linear Thinking    Mood/Affect:  Animated, Bright , Happy , Interested    CSW Assessment:  CSW received request for consult due to MOB presenting with a history of depression and anxiety.  She presented as easily engaged and receptive to the visit. FOB arrived to the room, and was noted to be supportive to the MOB as he asked questions related to postpartum depression.  MOB displayed a full range in affect and presented in a pleasant mood. MOB was interacting and bonding with the infant  during the entire visit. MOB does not present with any acute mental health symptoms, and she presents with insight and self-awareness related to her mental health needs postpartum.   CSW provided support and assisted the MOB to begin to process her thoughts and feelings as she transitions to the postpartum period.  MOB expressed happiness and excitement now that the infant has been born. She reflected upon the previous miscarriage that she experienced and the 2 year effort that was put forth in order to conceive this infant. MOB discussed normative anxieties that accompany early stages of pregnancy with history of loss, but MOB shared that her anxieties were reduced once she began the 2nd trimester.  MOB shared that she and the FOB have attempted to be flexible in their plans for the transition to postpartum period. She discussed that they originally wanted to have 2 days to be on their own at home prior to parents coming to help, but she reflected how they have decided to be more flexible in their plans as they begin to realize how tired they are.  MOB discussed belief that they are well supported by family and friends, and she denied any barriers to accessing help if needed. She also reported that the FOB has 2 weeks of FMLA, and she is looking forward to staying at home in order to bond and care for this infant.   MOB acknowledged history of depression and anxiety, and shared that she was diagnosed and recommended medication after  her previous miscarriage.  She also reported that she initiated therapy on her own, and discussed the numerous benefits of her 6 month participation in therapy. MOB shared that when she learned of this pregnancy, she and her PCP reduced her Wellbutrin.  MOB denied any difficulties with the reduction in dose, and discussed plan to continue on current dose.  MOB and FOB presented as attentive and engaged as CSW provided education on the baby blues and postpartum depression/anxiety.   FOB asked follow up questions, and family expressed intention to closely monitor MOB's mental health and to notify her medical provider if they note that current dose of Wellbutrin is not effective.   MOB and FOB denied additional questions, concerns, or needs at this time. Family expressed appreciation for the information, visit, and support, and agreed to contact CSW if additional needs arise.  CSW Plan/Description:  1)Patient/Family Education: Perinatal mood and anxiety disorders 2) CSW consulted with RN about MOB's rx for Wellbutrin since it is currently not ordered for inpatient admission.  RN to contact faculty practice regarding MOB's medications as MOB had reported consistently taking Wellbutrin during the pregnancy.  3)No Further Intervention Required/No Barriers to Discharge    Kelby FamVenning, Jarell Mcewen N, LCSW 02/27/2015, 1:25 PM

## 2015-02-27 NOTE — Anesthesia Postprocedure Evaluation (Signed)
  Anesthesia Post-op Note  Patient: Tina Weber  Procedure(s) Performed: * No procedures listed *  Patient Location: Mother/Baby  Anesthesia Type:Epidural  Level of Consciousness: awake and alert   Airway and Oxygen Therapy: Patient Spontanous Breathing  Post-op Pain: mild  Post-op Assessment: Post-op Vital signs reviewed, Patient's Cardiovascular Status Stable, Respiratory Function Stable, No signs of Nausea or vomiting, Pain level controlled, No headache, Spinal receding and Patient able to bend at knees              Post-op Vital Signs: Reviewed  Last Vitals:  Filed Vitals:   02/27/15 0600  BP: 141/73  Pulse: 91  Temp: 36.6 C  Resp: 20    Complications: No apparent anesthesia complications

## 2015-02-27 NOTE — Lactation Note (Signed)
This note was copied from the chart of Tina Jonnie FinnerJennifer Sinkler. Lactation Consultation Note New mom stating BF going OK. Having difficulty w/positioning and latching. Baby is doing good, but states she hasn't gotten down holding  The baby and BF yet.  Mom has tubular breast, Rt. Breast is slightly larger than the Lt. Breast. Hand expression demonstrated showing easy flow of colostrum. Mom happy. Mom has 2 finger width space between breast. Mom denies PCOS. Mom encouraged to feed baby 8-12 times/24 hours and with feeding cues. Mom encouraged to waken baby for feeds. Referred to Baby and Me Book in Breastfeeding section Pg. 22-23 for position options and Proper latch demonstration. Encouraged to call for assistance if needed and to verify proper latch. Mom encouraged to do skin-to-skin. WH/LC brochure given w/resources, support groups and LC services. Mom encouraged to do skin-to-skin.  Patient Name: Tina Weber ZOXWR'UToday's Date: 02/27/2015 Reason for consult: Initial assessment   Maternal Data Has patient been taught Hand Expression?: Yes Does the patient have breastfeeding experience prior to this delivery?: No  Feeding    LATCH Score/Interventions       Type of Nipple: Everted at rest and after stimulation  Comfort (Breast/Nipple): Soft / non-tender     Intervention(s): Breastfeeding basics reviewed;Support Pillows;Position options;Skin to skin     Lactation Tools Discussed/Used     Consult Status Consult Status: Follow-up Date: 02/28/15 Follow-up type: In-patient    Charyl DancerCARVER, Roena Sassaman G 02/27/2015, 5:55 AM

## 2015-02-27 NOTE — Lactation Note (Signed)
This note was copied from the chart of Tina Jonnie FinnerJennifer Pam. Lactation Consultation Note  Patient Name: Tina Weber ZOXWR'UToday's Date: 02/27/2015 Reason for consult: Follow-up assessment  Follow-up consult at 8024 hours old; Mom is a P1 Infant has breastfed x6 (18-25 min) + attempts x3 (0-5 min) since birth; voids-5; stool-5 since birth. Parents had just finished changing diaper (stool) when LC entered.  Infant was showing cues to feed again. Mom latched infant to right breast football hold with wide latch and flanged lips; [facility]  Infant latched and took a few sucks but then quit sucking and [facility].  Mom can hand express colostrum.   Reviewed with mom how to latch with sandwiching and asymmetrical latching technique; taught dad how to assist with teacup hold and flanging bottom lip as needed. Parents are very attentive to infant and excited of new baby. Reviewed cluster feeding and continuing to feed with cues. Encouraged to call for assistance as needed during the night.     Maternal Data    Feeding Feeding Type: Breast Fed Length of feed: 12 min  LATCH Score/Interventions Latch: Repeated attempts needed to sustain latch, nipple held in mouth throughout feeding, stimulation needed to elicit sucking reflex. Intervention(s): Adjust position  Audible Swallowing: A few with stimulation  Type of Nipple: Everted at rest and after stimulation  Comfort (Breast/Nipple): Soft / non-tender     Hold (Positioning): Assistance needed to correctly position infant at breast and maintain latch. (minimal assistance needed) Intervention(s): Breastfeeding basics reviewed;Support Pillows;Skin to skin  LATCH Score: 7  Lactation Tools Discussed/Used     Consult Status Consult Status: Follow-up Date: 02/28/15 Follow-up type: In-patient    Lendon KaVann, Jodie Leiner Walker 02/27/2015, 11:37 PM

## 2015-02-28 MED ORDER — AMLODIPINE BESYLATE 5 MG PO TABS: 5.0000 mg | ORAL_TABLET | Freq: Every day | ORAL | Status: DC

## 2015-02-28 MED ORDER — MEASLES, MUMPS & RUBELLA VAC ~~LOC~~ INJ
0.5000 mL | INJECTION | Freq: Once | SUBCUTANEOUS | Status: AC
  Administered 2015-02-28: 0.5 mL via SUBCUTANEOUS
  Filled 2015-02-28: qty 0.5

## 2015-02-28 NOTE — Discharge Summary (Signed)
Obstetric Discharge Summary Reason for Admission: induction of labor 2/2 cHTN Prenatal Procedures: none Intrapartum Procedures: spontaneous vaginal delivery Postpartum Procedures: none Complications-Operative and Postpartum: 2nd degree perineal laceration  Delivery Note At 10:33 PM a viable female was delivered via Vaginal, Spontaneous Delivery (Presentation: Right Occiput Anterior).  APGAR: 8, 9; weight 6 lb 13 oz (3090 g).   Placenta status: Intact, Spontaneous.  Cord: 3 vessels with the following complications: None.   Anesthesia: Epidural  Episiotomy: None Lacerations: 2nd degree Suture Repair: 2.0 vicryl Est. Blood Loss (mL): 68  Mom to postpartum.  Baby to Couplet care / Skin to Skin.  Hospital Course:  Active Problems:   Gestational hypertension   Tina Weber is a 34 y.o. G2P1011 s/p SVD.  Patient presented to L&D for IOL 2/2 cHTN.  She has postpartum course that was uncomplicated including no problems with ambulating, PO intake, urination, pain, or bleeding. The pt feels ready to go home and  will be discharged with outpatient follow-up.   Today: No acute events overnight.  Pt denies problems with ambulating, voiding or po intake.  She denies nausea or vomiting.  Pain is well controlled.  She has had flatus. She has had bowel movement.  Lochia Minimal.   Method of Feeding: breast   H/H: Lab Results  Component Value Date/Time   HGB 10.7* 02/25/2015 10:40 PM   HCT 32.1* 02/25/2015 10:40 PM   HCT 33.4* 12/13/2014 09:32 AM    Discharge Diagnoses: Term Pregnancy-delivered   Chronic HTN: Start Norvasc 5mg  daily on discharge  Discharge Information: Date: 02/28/2015 Activity: pelvic rest Diet: routine  Medications: PNV and Ibuprofen Breast feeding:  Yes Condition: stable Instructions: refer to handout Discharge to: home      Medication List    ASK your doctor about these medications        AMBIEN 10 MG tablet  Generic drug:  zolpidem  Take 10 mg by  mouth at bedtime as needed for sleep.     aspirin 81 MG tablet  Take 81 mg by mouth daily.     buPROPion 150 MG 12 hr tablet  Commonly known as:  WELLBUTRIN SR  Take 1 tablet (150 mg total) by mouth daily.     [employer] PRENATAL GUMMY PO  Take 2 each by mouth daily.     cyclobenzaprine 10 MG tablet  Commonly known as:  FLEXERIL  TAKE ONE TABLET BY MOUTH EVERY 8 HOURS AS NEEDED FOR MUSCLE SPASMS     labetalol 100 MG tablet  Commonly known as:  NORMODYNE  Take 1 tablet (100 mg total) by mouth 3 (three) times daily.           Follow-up Information    Follow up with [facility]. Schedule an appointment as soon as possible for a visit in 4 weeks.   Why:  For post-partum visit   Contact information:   [address] [place] [phone]      Shirlee LatchAngela Bacigalupo ,MD MPH PGY-2 [facility]  02/28/2015,7:08 AM  Seen and examined by me also Agree with note Aviva SignsMarie L Tamber Burtch, CNM

## 2015-02-28 NOTE — Discharge Instructions (Signed)

## 2015-02-28 NOTE — Lactation Note (Signed)
This note was copied from the chart of Tina Weber. Lactation ConsulJonnie Finnertation Note  Follow up visit made prior to discharge.  Mom states baby started cluster feeding last night and would not stop showing hunger cues so they supplemented with 6 mls of formula per cup this AM.  Baby then fell asleep.  Baby is currently in a quiet alert state.  Mom attempting to latch baby but baby does not seem interested.  Reassured mom.  Reviewed basics and discharge teaching including engorgement prevention and treatment.  Parents questions answered.  Lactation outpatient services and support reviewed and encouraged.  Patient Name: Tina Jonnie FinnerJennifer Dobler ZOXWR'UToday's Date: 02/28/2015     Maternal Data    Feeding Feeding Type: Formula  LATCH Score/Interventions                      Lactation Tools Discussed/Used     Consult Status      Huston FoleyMOULDEN, Shaunak Kreis S 02/28/2015, 10:01 AM

## 2015-03-07 ENCOUNTER — Ambulatory Visit (INDEPENDENT_AMBULATORY_CARE_PROVIDER_SITE_OTHER): Payer: BLUE CROSS/BLUE SHIELD | Admitting: Women's Health

## 2015-03-07 ENCOUNTER — Encounter: Payer: Self-pay | Admitting: Women's Health

## 2015-03-07 VITALS — BP 178/104 | HR 76

## 2015-03-07 DIAGNOSIS — O924 Hypogalactia: Secondary | ICD-10-CM

## 2015-03-07 DIAGNOSIS — I10 Essential (primary) hypertension: Secondary | ICD-10-CM | POA: Diagnosis not present

## 2015-03-07 MED ORDER — AMLODIPINE BESYLATE 10 MG PO TABS: 10.0000 mg | ORAL_TABLET | Freq: Every day | ORAL | Status: DC

## 2015-03-07 NOTE — Patient Instructions (Signed)
Tips To Increase Milk Supply  Lots of water! Enough so that your urine is clear  Plenty of calories, if you're not getting enough calories, your milk supply can decrease  Breastfeed/pump often, every 2-3 hours x 20-30mins  Fenugreek 3 pills 3 times a day, this may make your urine smell like maple syrup  Mother's Milk Tea  Lactation cookies, google for the recipe  Real oatmeal   

## 2015-03-07 NOTE — Progress Notes (Signed)
Patient ID: Tina Weber, female   DOB: [birth year], 34 y.o.   MRN: [id number]   [facility]  Patient name: Tina Weber MRN [id number]  Date of birth: [birth year]  CC & HPI:  Tina Weber is a 34 y.o. 332P1011 Caucasian female 9d s/p SVB [facility], presenting today for report of constant frontal ha, mild sob, and elevated bp at home. She was not d/c'd on any bp meds, however she resumed her labetalol 100mg  tid once she returned home. She was not on any meds prior to this pregnancy/had never been dx w/ HTN, bp elevated since 13wks so presumed CHTN.  Denies scotomata, ruq/epigastric pain, n/v.   Breastfeeding- has to supplement 2x/d d/t decreased supply  Pertinent History Reviewed:  Medical & Surgical Hx:   Past Medical History  Diagnosis Date  . Seizures     Last seizure almost one year ago  . Depression   . Supervision of normal pregnancy in first trimester 07/19/2014     [facility] Hortense RamalJonathan Legate 34 yo wm 1st Dating By LMP and US Pap  07/19/14 GC/CT Initial:                36+wks: Genetic Screen NT/IT:  CF screen  Anatomic US  Flu vaccine  Tdap Recommended ~ 28wks Glucose Screen  2 hr GBS  Feed Preference  Contraception  Circumcision  Childbirth Classes  Pediatrician    . Anxiety    Past Surgical History  Procedure Laterality Date  . Wisdom tooth extraction    . Cholecystectomy     Medications: Reviewed & Updated - see associated section Social History: Reviewed -  reports that she has never smoked. She has never used smokeless tobacco.  Objective Findings:  Vitals: BP 178/104 mmHg  Pulse 76  LMP 05/31/2014  Breastfeeding? Yes  Physical Examination: General appearance - alert, well appearing, and in no distress, no obvious sob/resp distress No JVD Chest - clear to auscultation, no wheezes, rales or rhonchi, symmetric air entry Heart - normal rate and regular rhythm Extremities - no edema  No results found for this or any previous  visit (from the past 24 hour(s)).   Assessment & Plan:  A:   9d s/p SVB  CHTN  Breastfeeding, slightly low milk supply P:  Discussed w/ JVF, to continue labetalol 100mg  TID, add norvasc 10mg  daily and f/u in 5-7d   Gave tips to help increase milk supply  Call/seek care for worsening sx   Marge DuncansBooker, Faryal Marxen Randall CNM, [facility] 03/07/2015 3:13 PM

## 2015-03-14 ENCOUNTER — Ambulatory Visit (INDEPENDENT_AMBULATORY_CARE_PROVIDER_SITE_OTHER): Payer: BLUE CROSS/BLUE SHIELD | Admitting: Women's Health

## 2015-03-14 ENCOUNTER — Ambulatory Visit: Payer: BLUE CROSS/BLUE SHIELD | Admitting: Women's Health

## 2015-03-14 ENCOUNTER — Encounter: Payer: Self-pay | Admitting: Women's Health

## 2015-03-14 VITALS — BP 122/64 | HR 88 | Wt 166.0 lb

## 2015-03-14 DIAGNOSIS — I10 Essential (primary) hypertension: Secondary | ICD-10-CM | POA: Diagnosis not present

## 2015-03-14 DIAGNOSIS — N9489 Other specified conditions associated with female genital organs and menstrual cycle: Secondary | ICD-10-CM | POA: Diagnosis not present

## 2015-03-14 DIAGNOSIS — O8612 Endometritis following delivery: Secondary | ICD-10-CM | POA: Diagnosis not present

## 2015-03-14 DIAGNOSIS — R300 Dysuria: Secondary | ICD-10-CM

## 2015-03-14 DIAGNOSIS — R102 Pelvic and perineal pain: Secondary | ICD-10-CM | POA: Diagnosis not present

## 2015-03-14 DIAGNOSIS — Z0130 Encounter for examination of blood pressure without abnormal findings: Secondary | ICD-10-CM

## 2015-03-14 DIAGNOSIS — O9089 Other complications of the puerperium, not elsewhere classified: Secondary | ICD-10-CM

## 2015-03-14 DIAGNOSIS — N898 Other specified noninflammatory disorders of vagina: Secondary | ICD-10-CM

## 2015-03-14 LAB — POCT URINALYSIS DIPSTICK
Glucose, UA: NEGATIVE
Ketones, UA: NEGATIVE
NITRITE UA: NEGATIVE
PROTEIN UA: NEGATIVE

## 2015-03-14 LAB — POCT WET PREP (WET MOUNT): CLUE CELLS WET PREP WHIFF POC: NEGATIVE

## 2015-03-14 MED ORDER — AMOXICILLIN-POT CLAVULANATE 875-125 MG PO TABS: 1.0000 | ORAL_TABLET | Freq: Two times a day (BID) | ORAL | Status: DC

## 2015-03-14 NOTE — Progress Notes (Signed)
Patient ID: Tina Weber, female   DOB: 07/24/1981, 34 y.o.   MRN: [id number]   [facility]  Patient name: Tina Weber MRN [id number]  Date of birth: [birth year]  CC & HPI:  Tina Weber is a 34 y.o. Caucasian G64P1011 female 33d s/p SVB after IOL for CHTN presenting today for bp f/u after adding norvasc 10mg  daily to her labetalol 100mg  TID regimen d/t increased bp's. She also reports perineal pain and feels like something popped there last week. Some low-grade fever/chills and bad odor to lochia, also some discomfort in uterus when voiding. Breastfeeding.   Pertinent History Reviewed:  Medical & Surgical Hx:   Past Medical History  Diagnosis Date  . Seizures     Last seizure almost one year ago  . Depression   . Supervision of normal pregnancy in first trimester 07/19/2014     [facility] jeslie lowe 34 yo wm 1st Dating By LMP and [place] Pap  07/19/14 GC/CT Initial:                36+wks: Genetic Screen NT/IT:  CF screen  Anatomic [place]  Flu vaccine  Tdap Recommended ~ 28wks Glucose Screen  2 hr GBS  Feed Preference  Contraception  Circumcision  Childbirth Classes  Pediatrician    . Anxiety    Past Surgical History  Procedure Laterality Date  . Wisdom tooth extraction    . Cholecystectomy     Medications: Reviewed & Updated - see associated section Social History: Reviewed -  reports that she has never smoked. She has never used smokeless tobacco.  Objective Findings:  Vitals: BP 122/64 mmHg  Pulse 88  Wt 166 lb (75.297 kg)  LMP 05/31/2014  Breastfeeding? Yes  Physical Examination: General appearance - alert, well appearing, and in no distress Abdomen - +uterine tenderness Pelvic - shallow perineal lac open and loose suture noted, + malodorous lochia  Results for orders placed or performed in visit on 03/14/15 (from the past 24 hour(s))  POCT urinalysis dipstick   Collection Time: 03/14/15  3:41 PM  Result Value Ref Range   Color, UA     Clarity, UA     Glucose, UA neg    Bilirubin, UA     Ketones, UA neg    Spec Grav, UA     Blood, UA large    pH, UA     Protein, UA neg    Urobilinogen, UA     Nitrite, UA neg    Leukocytes, UA small (1+) (A) Negative  POCT Wet Prep [place])   Collection Time: 03/14/15  4:18 PM  Result Value Ref Range   Source Wet Prep POC vaginal    WBC, Wet Prep HPF POC moderate    Bacteria Wet Prep HPF POC None None, Few   BACTERIA WET PREP MORPHOLOGY POC     Clue Cells Wet Prep HPF POC None None   Clue Cells Wet Prep Whiff POC Negative Whiff    Yeast Wet Prep HPF POC None    KOH Wet Prep POC     Trichomonas Wet Prep HPF POC none      Assessment & Plan:  A:   34d s/p SVB after IOL for CHTN  BP improved w/ adding Norvasc 10mg   PP Endometritis  Breastfeeding  Perineal pain/slightly open lac P:  Continue labetalol & norvasc for now. Was dx w/ CHTN during pregnancy, so stop both meds 2d prior to pp  visit to assess bp and need for meds at that time  Rx augmentin  bid x 10d for pp endometritis  Will also send urine for cx  Warm sitz bath couple of times daily, americaine spray prn  Pelvic rest    F/U as scheduled for pp visit on 8/19   Marge Duncans CNM, [facility] 03/14/2015 4:18 PM

## 2015-03-16 ENCOUNTER — Telehealth: Payer: Self-pay | Admitting: Adult Health

## 2015-03-16 LAB — URINE CULTURE

## 2015-03-16 NOTE — Telephone Encounter (Signed)
Pt aware culture did not grow anything, keep taking augmentin for endometritis

## 2015-04-07 ENCOUNTER — Ambulatory Visit: Payer: BLUE CROSS/BLUE SHIELD | Admitting: Obstetrics & Gynecology

## 2015-04-13 ENCOUNTER — Ambulatory Visit (INDEPENDENT_AMBULATORY_CARE_PROVIDER_SITE_OTHER): Payer: BLUE CROSS/BLUE SHIELD | Admitting: Obstetrics & Gynecology

## 2015-04-13 ENCOUNTER — Encounter: Payer: Self-pay | Admitting: Obstetrics & Gynecology

## 2015-04-13 DIAGNOSIS — O99345 Other mental disorders complicating the puerperium: Secondary | ICD-10-CM

## 2015-04-13 DIAGNOSIS — F53 Postpartum depression: Secondary | ICD-10-CM | POA: Insufficient documentation

## 2015-04-13 MED ORDER — ESCITALOPRAM OXALATE 10 MG PO TABS: 10.0000 mg | ORAL_TABLET | Freq: Every day | ORAL | Status: DC

## 2015-04-13 MED ORDER — DESOGESTREL-ETHINYL ESTRADIOL 0.15-0.02/0.01 MG (21/5) PO TABS: 1.0000 | ORAL_TABLET | Freq: Every day | ORAL | Status: DC

## 2015-04-13 NOTE — Progress Notes (Signed)
Patient ID: Tina Weber, female   DOB: 06-11-1981, 34 y.o.   MRN: [id number] Subjective:     Tina Weber is a 34 y.o. female who presents for a postpartum visit. She is 6 weeks postpartum following a spontaneous vaginal delivery. I have fully reviewed the prenatal and intrapartum course. The delivery was at 38.5 gestational weeks. Outcome: spontaneous vaginal delivery. Anesthesia: epidural. Postpartum course has been significant for depression. Baby's course has been significant for postpartum depression. Baby is feeding by bottle - nutramagen. Bleeding no bleeding. Bowel function is normal. Bladder function is normal. Patient is not sexually active. Contraception method is OCP (estrogen/progesterone). Postpartum depression screening: positive.  The following portions of the patient's history were reviewed and updated as appropriate: allergies, current medications, past family history, past medical history, past social history, past surgical history and problem list.  Review of Systems Pertinent items are noted in HPI.   Objective:    BP 100/80 mmHg  Pulse 76  Wt 165 lb (74.844 kg)  General:  alert, cooperative and no distress   Breasts:    Lungs:   Heart:    Abdomen: soft, non-tender; bowel sounds normal; no masses,  no organomegaly   Vulva:  normal  Vagina: normal vagina, no discharge, exudate, lesion, or erythema  Cervix:  no cervical motion tenderness  Corpus: normal size, contour, position, consistency, mobility, non-tender  Adnexa:  normal adnexa  Rectal Exam:         Assessment:     normal postpartum exam. Pap smear not done at today's visit.  Post partum depression Plan:    1. Contraception: desogestrel + 20 mics EE 2. lexapro 10 mg  Follow up 1 month 3. Follow up in: 1 month or as needed.

## 2015-04-15 IMAGING — US US OB COMP LESS 14 WK
1 series · 14 of 28 positions shown · non-contrast
Comparison: None.

CLINICAL DATA: Bleeding, pregnant

EXAM:
OBSTETRIC <14 WK US AND TRANSVAGINAL OB US
TECHNIQUE: Both transabdominal and transvaginal ultrasound examinations were
performed for complete evaluation of the gestation as well as the
maternal uterus, adnexal regions, and pelvic cul-de-sac.
Transvaginal technique was performed to assess early pregnancy.

[Series 1: us ob transvaginal · 14 of 43 slices shown]
[im 2/43]
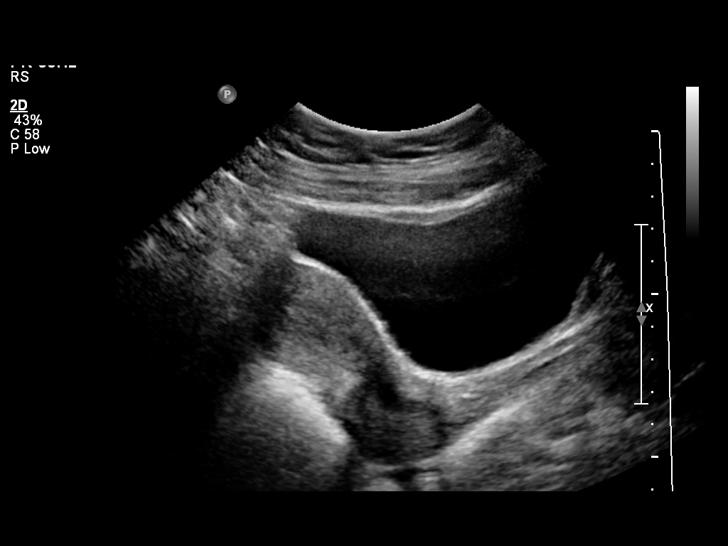
[im 5/43]
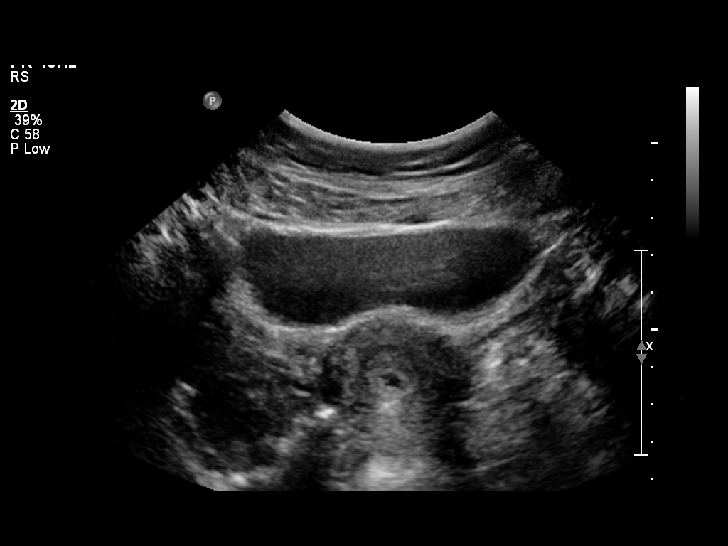
[im 8/43]
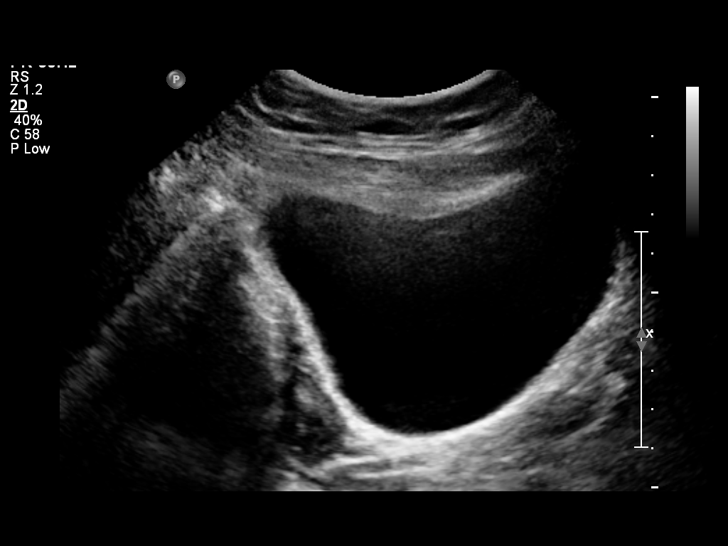
[im 11/43]
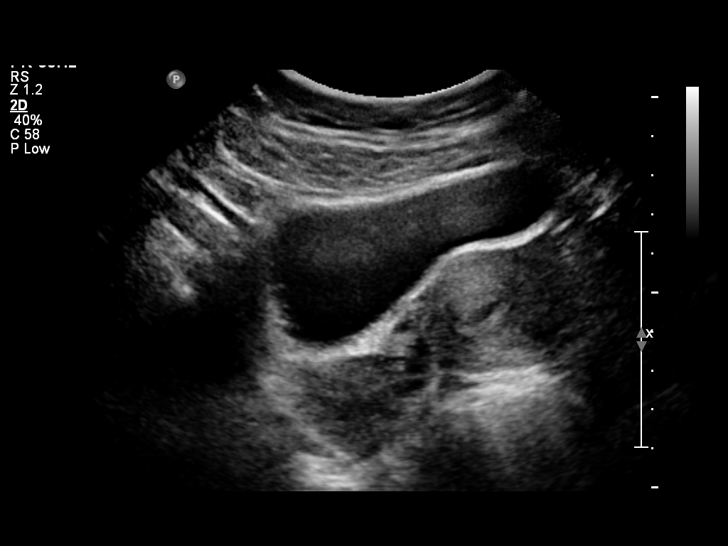
[im 15/43]
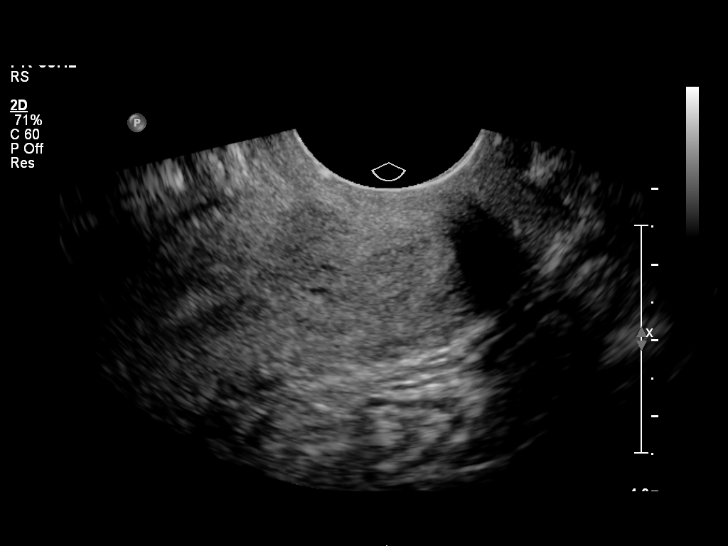
[im 18/43]
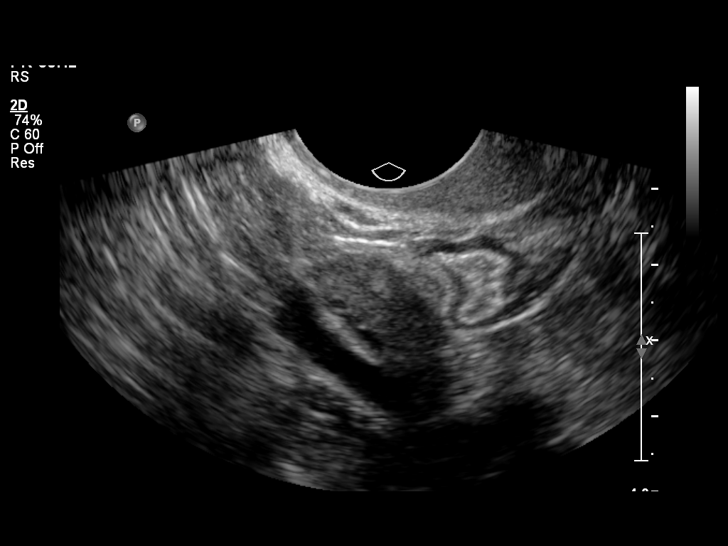
[im 21/43]
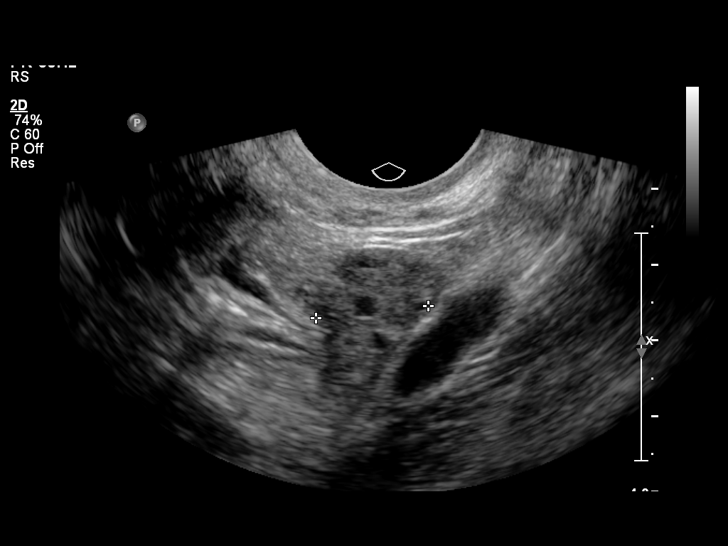
[im 24/43]
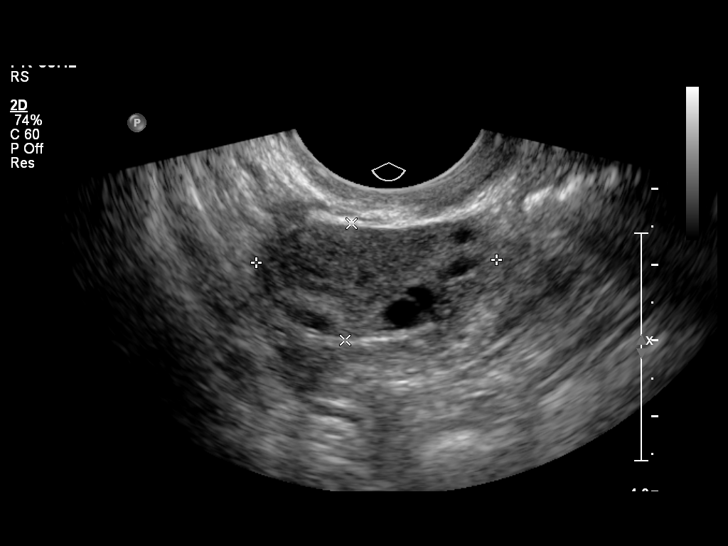
[im 27/43]
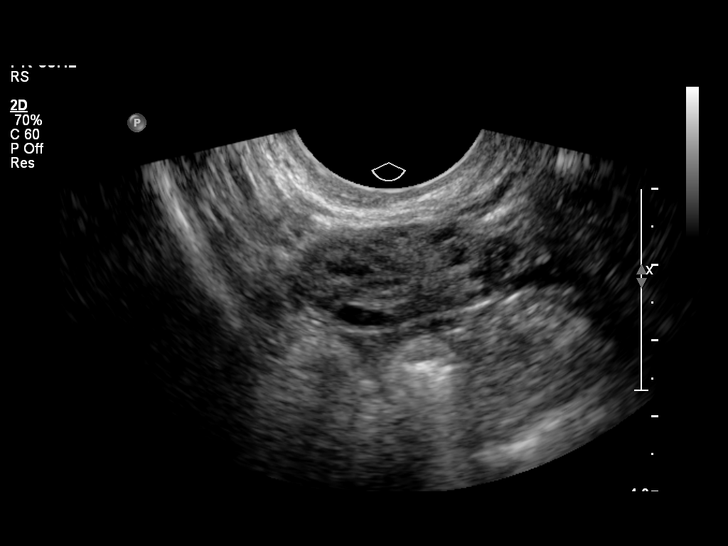
[im 30/43]
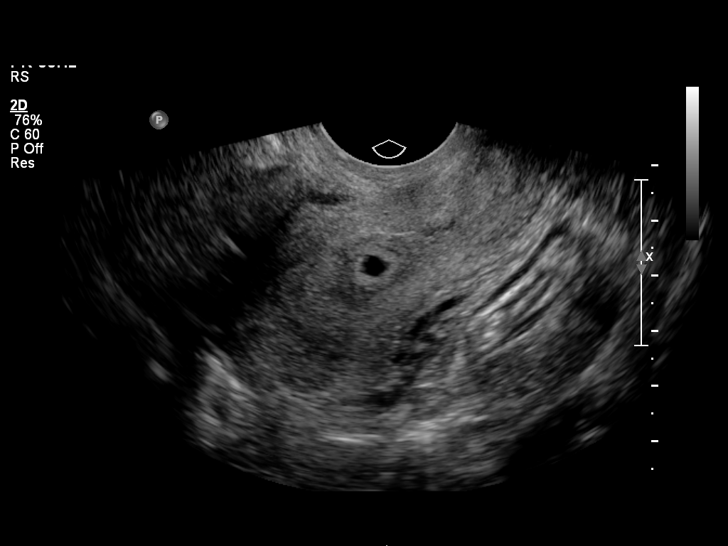
[im 33/43]
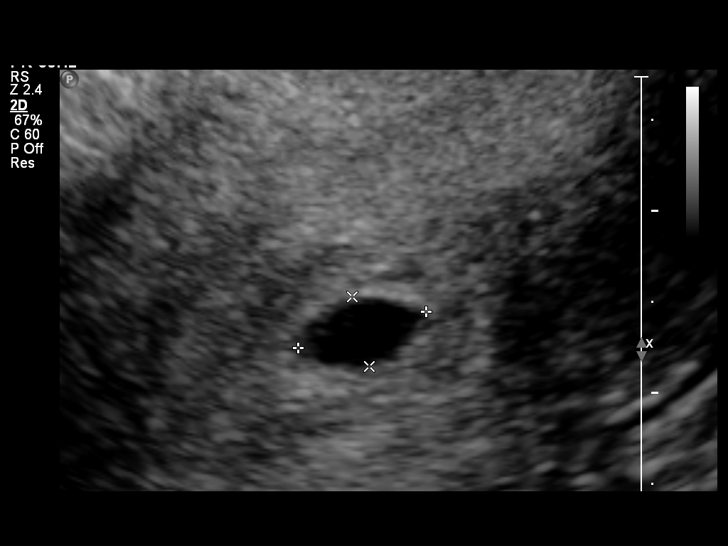
[im 36/43]
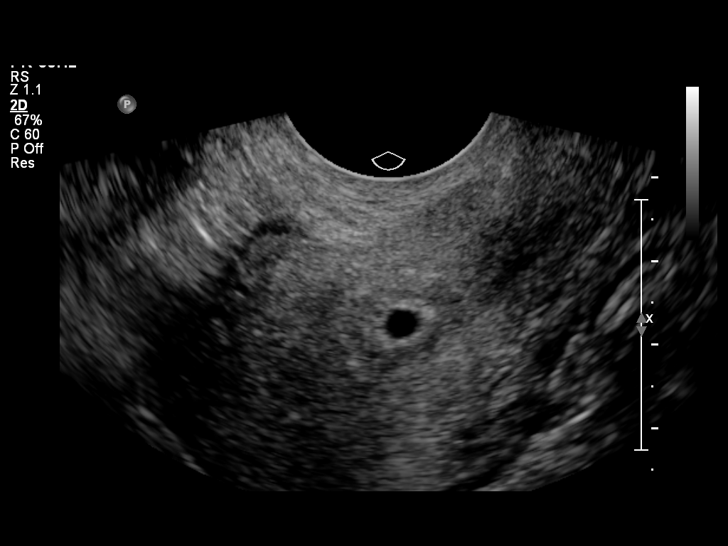
[im 39/43]
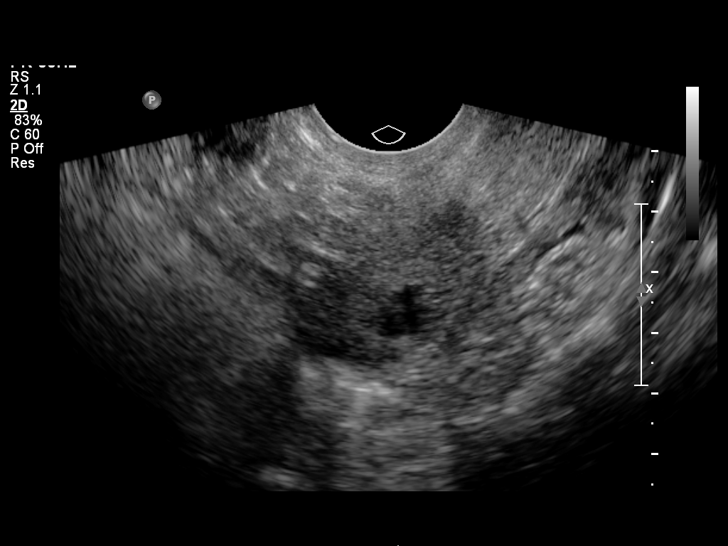
[im 43/43]
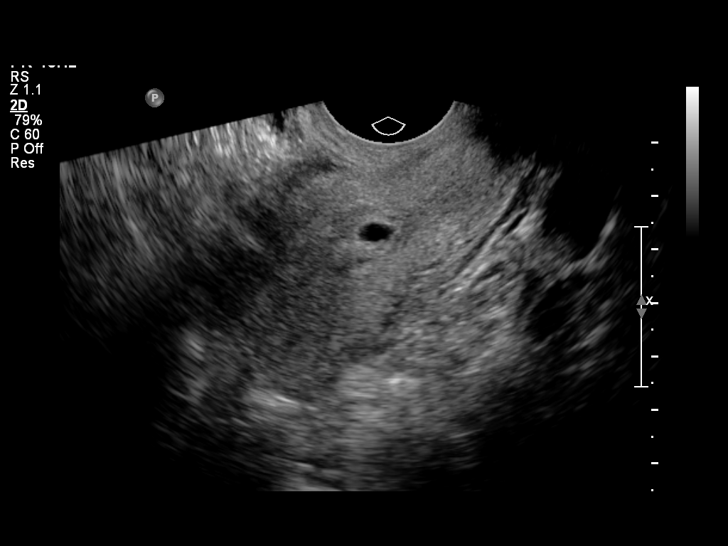

[14 of 28 positions shown; findings below may reference images not displayed]

FINDINGS: Intrauterine gestational sac: Single

Yolk sac:  Not seen

Embryo:  Not seen

Cardiac Activity: Not seen

MSD:  5   mm   5 w   0  d

US EDC: 06/19/2014

Maternal uterus/adnexae: Unremarkable ovaries. Hypoechoic 11 x 8 x
10 mm probable posterior fundal fibroid. No free fluid.
IMPRESSION: 1. Probable early intrauterine gestational sac, but no yolk sac,
fetal pole, or cardiac activity yet visualized. Recommend follow-up
quantitative B-HCG levels and follow-up US in 14 days to confirm and
assess viability. This recommendation follows SRU consensus
guidelines: Diagnostic Criteria for Nonviable Pregnancy Early in the
First Trimester. N Engl J Med 5578; [DATE].

## 2015-04-27 ENCOUNTER — Telehealth: Payer: Self-pay | Admitting: Obstetrics & Gynecology

## 2015-04-27 NOTE — Telephone Encounter (Signed)
Pt requesting office notes and letter from 04/13/2015 visit faxed to her employer at [phone]. Information faxed per pt request.

## 2015-05-03 ENCOUNTER — Other Ambulatory Visit: Payer: Self-pay | Admitting: Family Medicine

## 2015-05-04 NOTE — Telephone Encounter (Signed)
Ok times one i wrote this in another place also so plz make sure not duplicated

## 2015-05-04 NOTE — Telephone Encounter (Signed)
Call pt, recently seen by dr Despina Hidden and dx'ed post part depr. Ask about why she needs the med and how she is doing

## 2015-05-04 NOTE — Telephone Encounter (Signed)
Ok may ref times one 

## 2015-05-11 ENCOUNTER — Ambulatory Visit (INDEPENDENT_AMBULATORY_CARE_PROVIDER_SITE_OTHER): Payer: BLUE CROSS/BLUE SHIELD | Admitting: Obstetrics & Gynecology

## 2015-05-11 ENCOUNTER — Encounter: Payer: Self-pay | Admitting: Obstetrics & Gynecology

## 2015-05-11 VITALS — BP 160/120 | HR 72 | Wt 166.0 lb

## 2015-05-11 DIAGNOSIS — M62830 Muscle spasm of back: Secondary | ICD-10-CM

## 2015-05-11 DIAGNOSIS — M546 Pain in thoracic spine: Secondary | ICD-10-CM | POA: Diagnosis not present

## 2015-05-11 DIAGNOSIS — M549 Dorsalgia, unspecified: Secondary | ICD-10-CM

## 2015-05-11 MED ORDER — PIROXICAM 20 MG PO CAPS: 20.0000 mg | ORAL_CAPSULE | Freq: Every day | ORAL | Status: DC

## 2015-05-11 MED ORDER — HYDROCODONE-ACETAMINOPHEN 5-325 MG PO TABS: 1.0000 | ORAL_TABLET | Freq: Four times a day (QID) | ORAL | Status: DC | PRN

## 2015-05-11 NOTE — Progress Notes (Signed)
Patient ID: Tina Weber, female   DOB: May 26, 1981, 34 y.o.   MRN: [id number] Chief Complaint  Patient presents with  . Follow-up    depression.c/c back pain, had epidural.    Blood pressure 160/120, pulse 72, weight 166 lb (75.297 kg), currently breastfeeding.      Trigger Point Injection   Pre-operative diagnosis: myofascial pain, left rhomboid  Post-operative diagnosis: myofascial pain  After risks and benefits were explained including bleeding, infection, worsening of the pain, damage to the area being injected, weakness, allergic reaction to medications, vascular injection, and nerve damage, signed consent was obtained.  All questions were answered.    The area of the trigger point was identified and the skin prepped three times with alcohol and the alcohol allowed to dry.  Next, a 25 gauge 0.5 inch needle was placed in the area of the trigger point.  Once reproduction of the pain was elicited and negative aspiration confirmed, the trigger point was injected and the needle removed.    The patient did not tolerate the procedure well and there were not complications.    Medication used:20 cc 0.5% marcaine  Trigger points injected: multiple    Trigger point(s) location(s):  left rhomboid, paraspinous  Meds ordered this encounter  Medications  . piroxicam (FELDENE) 20 MG capsule    Sig: Take 1 capsule (20 mg total) by mouth daily.    Dispense:  30 capsule    Refill:  1  . DISCONTD: HYDROcodone-acetaminophen (NORCO/VICODIN) 5-325 MG per tablet    Sig: Take 1 tablet by mouth every 6 (six) hours as needed.    Dispense:  30 tablet    Refill:  0  . HYDROcodone-acetaminophen (NORCO/VICODIN) 5-325 MG per tablet    Sig: Take 1 tablet by mouth every 6 (six) hours as needed.    Dispense:  30 tablet    Refill:  0

## 2015-05-23 ENCOUNTER — Telehealth: Payer: Self-pay | Admitting: Obstetrics & Gynecology

## 2015-05-24 ENCOUNTER — Telehealth: Payer: Self-pay | Admitting: Obstetrics & Gynecology

## 2015-05-24 MED ORDER — OXYCODONE-ACETAMINOPHEN 5-325 MG PO TABS: 1.0000 | ORAL_TABLET | ORAL | Status: DC | PRN

## 2015-05-24 NOTE — Telephone Encounter (Signed)
Pt informed pain med faxed to pharmacy. Per Dr. Despina Hidden will no longer be able to continue to provide pain medication for upper back pain and injection not helping. Pt informed will need to contact PCP for referral to Chiropractor. Pt verbalized understanding.

## 2015-05-25 ENCOUNTER — Ambulatory Visit: Payer: BLUE CROSS/BLUE SHIELD | Admitting: Obstetrics & Gynecology

## 2015-07-19 ENCOUNTER — Other Ambulatory Visit: Payer: Self-pay | Admitting: Family Medicine

## 2015-07-19 NOTE — Telephone Encounter (Signed)
Guess what-patient needs to be seen may have 15 tablets needs office visit

## 2015-08-09 ENCOUNTER — Encounter: Payer: Self-pay | Admitting: Women's Health

## 2015-08-09 ENCOUNTER — Ambulatory Visit (INDEPENDENT_AMBULATORY_CARE_PROVIDER_SITE_OTHER): Payer: BLUE CROSS/BLUE SHIELD | Admitting: Women's Health

## 2015-08-09 VITALS — BP 120/84 | HR 72 | Ht 64.0 in | Wt 147.5 lb

## 2015-08-09 DIAGNOSIS — N8110 Cystocele, unspecified: Secondary | ICD-10-CM | POA: Diagnosis not present

## 2015-08-09 DIAGNOSIS — R350 Frequency of micturition: Secondary | ICD-10-CM

## 2015-08-09 DIAGNOSIS — Z3202 Encounter for pregnancy test, result negative: Secondary | ICD-10-CM

## 2015-08-09 DIAGNOSIS — IMO0002 Reserved for concepts with insufficient information to code with codable children: Principal | ICD-10-CM

## 2015-08-09 DIAGNOSIS — IMO0001 Reserved for inherently not codable concepts without codable children: Secondary | ICD-10-CM

## 2015-08-09 LAB — POCT URINALYSIS DIPSTICK
Blood, UA: NEGATIVE
Glucose, UA: NEGATIVE
KETONES UA: NEGATIVE
Leukocytes, UA: NEGATIVE
NITRITE UA: NEGATIVE
PROTEIN UA: NEGATIVE

## 2015-08-09 LAB — POCT URINE PREGNANCY: PREG TEST UR: NEGATIVE

## 2015-08-09 NOTE — Progress Notes (Signed)
Patient ID: Tina Weber Atlas, female   DOB: 11/04/1980, 34 y.o.   MRN: [id number]   [facility]  Patient name: Tina Weber Russi MRN [id number]  Date of birth: 11/04/1980  CC & HPI:  Tina Weber Bhatt is a 34 y.o. 752P1011 Caucasian female 5 months s/p svb, presenting today for report of feeling like bladder has dropped x 6wks. Was hard to put in tampon last month, feels bulge outside of vagina when bladder is full, has to push it back in to void. Baby weighed 6lb13oz, pushed x 2 hours.  Patient's last menstrual period was 07/30/2015. The current method of family planning is OCP (estrogen/progesterone). Last pap 07/2014, neg w/ +HRHPV  Pertinent History Reviewed:  Medical & Surgical Hx:   Past Medical History  Diagnosis Date  . Seizures (HCC)     Last seizure almost one year ago  . Depression   . Supervision of normal pregnancy in first trimester 07/19/2014     [facility] Hortense RamalJonathan Burkman 34 yo wm 1st Dating By LMP and US Pap  07/19/14 GC/CT Initial:                36+wks: Genetic Screen NT/IT:  CF screen  Anatomic US  Flu vaccine  Tdap Recommended ~ 28wks Glucose Screen  2 hr GBS  Feed Preference  Contraception  Circumcision  Childbirth Classes  Pediatrician    . Anxiety   . Migraines    Past Surgical History  Procedure Laterality Date  . Wisdom tooth extraction    . Cholecystectomy     Medications: Reviewed & Updated - see associated section Social History: Reviewed -  reports that she has never smoked. She has never used smokeless tobacco.  Objective Findings:  Vitals: BP 120/84 mmHg  Pulse 72  Ht 5\' 4"  (1.626 m)  Wt 147 lb 8 oz (66.906 kg)  BMI 25.31 kg/m2  LMP 07/30/2015  Breastfeeding? No Body mass index is 25.31 kg/(m^2).  Physical Examination: General appearance - alert, well appearing, and in no distress Pelvic - normal external genitalia, no obvious protrusion at normal resting tone. Grade 3 cystocele w/ valsalva  Results for orders placed or  performed in visit on 08/09/15 (from the past 24 hour(s))  POCT urine pregnancy   Collection Time: 08/09/15  3:37 PM  Result Value Ref Range   Preg Test, Ur Negative Negative  POCT Urinalysis Dipstick   Collection Time: 08/09/15  3:39 PM  Result Value Ref Range   Color, UA     Clarity, UA     Glucose, UA neg    Bilirubin, UA     Ketones, UA neg    Spec Grav, UA     Blood, UA neg    pH, UA     Protein, UA neg    Urobilinogen, UA     Nitrite, UA neg    Leukocytes, UA Negative Negative     Assessment & Plan:  A:   5mths s/p SVB 6lb13oz baby, 2hr 2nd stage  Grade 3 cystocele  P:  Discussed w/ JVF, refer to [facility] for [facility]  Return in about 6 weeks (around 09/20/2015) for Pap & physical.  Marge DuncansBooker, Kimberly Randall CNM, [facility] 08/09/2015 4:02 PM

## 2015-08-23 ENCOUNTER — Ambulatory Visit (INDEPENDENT_AMBULATORY_CARE_PROVIDER_SITE_OTHER): Payer: BLUE CROSS/BLUE SHIELD | Admitting: Family Medicine

## 2015-08-23 ENCOUNTER — Encounter: Payer: Self-pay | Admitting: Family Medicine

## 2015-08-23 VITALS — BP 122/82 | Ht 64.0 in | Wt 141.4 lb

## 2015-08-23 DIAGNOSIS — F32A Depression, unspecified: Secondary | ICD-10-CM

## 2015-08-23 DIAGNOSIS — F329 Major depressive disorder, single episode, unspecified: Secondary | ICD-10-CM

## 2015-08-23 DIAGNOSIS — F411 Generalized anxiety disorder: Secondary | ICD-10-CM | POA: Diagnosis not present

## 2015-08-23 DIAGNOSIS — G4700 Insomnia, unspecified: Secondary | ICD-10-CM

## 2015-08-23 MED ORDER — DULOXETINE HCL 30 MG PO CPEP: 30.0000 mg | ORAL_CAPSULE | Freq: Every day | ORAL | Status: DC

## 2015-08-23 MED ORDER — TEMAZEPAM 30 MG PO CAPS: 30.0000 mg | ORAL_CAPSULE | Freq: Every evening | ORAL | Status: DC | PRN

## 2015-08-23 MED ORDER — BUPROPION HCL ER (SR) 150 MG PO TB12: 150.0000 mg | ORAL_TABLET | Freq: Two times a day (BID) | ORAL | Status: DC

## 2015-08-23 NOTE — Progress Notes (Signed)
   Subjective:    Patient ID: Tina Weber, female    DOB: 12-13-1980, 35 y.o.   MRN: [id number]  Depression        This is a chronic problem.  The current episode started more than 1 year ago.   Associated symptoms include restlessness.   Patient in today for a medication check. Patient states that she was placed on Lexapro along with Wellbutrin for post partum depression by OBGYN and she states that it is making her feel worst.   Trouble sleeping   fam hx of depr for both parents.  keppra neurologist takes regular, helps migraines also take sfor seizure,  Gets migr q  Works at [employer], over the [employer], go go job  Long hx of insomnia three yrs now ,  On wellbutrin for two yrs, started   paxil got angry, celexa felt bd,   Review of Systems  Psychiatric/Behavioral: Positive for depression.   1 migraine headache every couple weeks or so. No chest pain no abdominal pain no change in bowel habits mild fatigue     Objective:   Physical Exam  Alert no acute distress. HEENT normal. Lungs clear heart regular in rhythm neuro exam intact      Assessment & Plan:  Impression depression, exacerbated by postpartum status, with substantial insomnia and some features of mood disorder discussed at great length. Feel probable not enough criteria to diagnose bipolar. However serotonin reuptake inhibitors have not succeeded. Wellbutrin is not enough for patient. On Xanax still not getting enough sleep. Notes daytime fatigue. No substantial snoring. Has not done well with Paxil or Celexa or Lexapro plan initiate Cymbalta. Hold Xanax. Initiate Restoril. Maintain Wellbutrin. Exercise encourage. If this does not work need to strongly consider psychiatry referral. Discussed with patient. Easily 25 minutes spent most in discussion

## 2015-09-20 ENCOUNTER — Telehealth: Payer: Self-pay | Admitting: Family Medicine

## 2015-09-20 ENCOUNTER — Other Ambulatory Visit: Payer: BLUE CROSS/BLUE SHIELD | Admitting: Women's Health

## 2015-09-20 NOTE — Telephone Encounter (Signed)
Pt's temazepam (RESTORIL) 30 MG capsule was DENIED for additional quantity limit  See denial in green folder  Please advise

## 2015-09-22 NOTE — Telephone Encounter (Signed)
LMRC

## 2015-09-22 NOTE — Telephone Encounter (Signed)
Some insur companies are now limiting amnt of sleeping pills to less than thirty per month to help pts avoid nightly dependence on the meds, unfortunate for pts such as Tina Weber, who need it nightly, they are caught in the middle, can she try a smaller number, say 15, and if it helps , and insur co cont to deny, she can pay for addtnl herself

## 2015-09-25 NOTE — Telephone Encounter (Signed)
LMRC 09/25/15 

## 2015-09-26 NOTE — Telephone Encounter (Signed)
Spoke with patient and informed her per Dr.Steve Luking-Some insur companies are now limiting amnt of sleeping pills to less than thirty per month to help pts avoid nightly dependence on the meds, unfortunate for patients such as Tina Weber, who need it nightly, they are caught in the middle, can she try a smaller number, say 15, and if it helps , and insurance company continues to deny, she can pay for additional  Herself. Patient verbalized understanding and stated that she is ok with paying for the additional 15 tablets.

## 2015-09-26 NOTE — Telephone Encounter (Signed)
Ok rec first trying fifteen to see how it goes

## 2015-09-27 MED ORDER — TEMAZEPAM 30 MG PO CAPS: 30.0000 mg | ORAL_CAPSULE | Freq: Every evening | ORAL | Status: DC | PRN

## 2015-09-27 NOTE — Telephone Encounter (Signed)
LMRC

## 2015-10-04 NOTE — Telephone Encounter (Signed)
Patient verbalized understanding that the insurance will only cover 15 pills a month and is willing to pay for the extra 15 pills a month if she needs more than 15 for the month

## 2015-10-04 NOTE — Telephone Encounter (Signed)
Ok plz proceed

## 2015-10-18 ENCOUNTER — Telehealth: Payer: Self-pay | Admitting: Family Medicine

## 2015-10-18 NOTE — Telephone Encounter (Signed)
temazepam (RESTORIL) 30 MG capsule  Pt is stating that she is not getting any rest still with this med can we give  Her something else or can we up the dose? Please advise   [facility]

## 2015-10-19 NOTE — Telephone Encounter (Signed)
ntsw pt,hear her out for a bit on her challenges, then let her know this is not a case of simple insomnia, with her substantial challenges with depression and anxiety, and now on two antidepressants, (with numerous others failing in the past) and now addtnl challenge of postpartum component, i think pt needs to receive her insomnia/anxiety/depression  Care with a specialist i. E. A psychiatrist.

## 2015-10-19 NOTE — Telephone Encounter (Signed)
LMRC

## 2015-10-27 NOTE — Telephone Encounter (Signed)
[place] [facility]

## 2015-10-31 NOTE — Telephone Encounter (Signed)
Left message to return call 

## 2015-11-16 ENCOUNTER — Telehealth: Payer: Self-pay

## 2015-11-16 DIAGNOSIS — G4700 Insomnia, unspecified: Secondary | ICD-10-CM

## 2015-11-16 DIAGNOSIS — F32A Depression, unspecified: Secondary | ICD-10-CM

## 2015-11-16 DIAGNOSIS — F419 Anxiety disorder, unspecified: Secondary | ICD-10-CM

## 2015-11-16 DIAGNOSIS — F329 Major depressive disorder, single episode, unspecified: Secondary | ICD-10-CM

## 2015-11-16 NOTE — Telephone Encounter (Signed)
See telephone not from 10/18/15. Notified patient this is not a case of simple insomnia, with her substantial challenges with depression and anxiety, and now on two antidepressants, (with numerous others failing in the past) and now addtnl challenge of postpartum component, Dr. Brett CanalesSteve thinks patient needs to receive her insomnia/anxiety/depression Care with a specialist i. E. A psychiatrist. Patient verbalized understanding. Referral in the system.

## 2015-11-20 ENCOUNTER — Encounter: Payer: Self-pay | Admitting: Family Medicine

## 2015-11-22 ENCOUNTER — Ambulatory Visit: Payer: BLUE CROSS/BLUE SHIELD | Admitting: Family Medicine

## 2015-11-30 ENCOUNTER — Encounter: Payer: Self-pay | Admitting: Family Medicine

## 2015-11-30 ENCOUNTER — Ambulatory Visit (INDEPENDENT_AMBULATORY_CARE_PROVIDER_SITE_OTHER): Payer: BLUE CROSS/BLUE SHIELD | Admitting: Family Medicine

## 2015-11-30 VITALS — BP 120/78 | Ht 64.0 in | Wt 142.2 lb

## 2015-11-30 DIAGNOSIS — R5383 Other fatigue: Secondary | ICD-10-CM | POA: Diagnosis not present

## 2015-11-30 DIAGNOSIS — F329 Major depressive disorder, single episode, unspecified: Secondary | ICD-10-CM

## 2015-11-30 DIAGNOSIS — F419 Anxiety disorder, unspecified: Secondary | ICD-10-CM

## 2015-11-30 DIAGNOSIS — F32A Depression, unspecified: Secondary | ICD-10-CM

## 2015-11-30 DIAGNOSIS — G4700 Insomnia, unspecified: Secondary | ICD-10-CM

## 2015-11-30 MED ORDER — BUPROPION HCL ER (SR) 150 MG PO TB12: 150.0000 mg | ORAL_TABLET | Freq: Two times a day (BID) | ORAL | Status: DC

## 2015-11-30 MED ORDER — DULOXETINE HCL 30 MG PO CPEP: 30.0000 mg | ORAL_CAPSULE | Freq: Every day | ORAL | Status: DC

## 2015-11-30 MED ORDER — CLONAZEPAM 0.25 MG PO TBDP: ORAL_TABLET | ORAL | Status: DC

## 2015-11-30 MED ORDER — TEMAZEPAM 30 MG PO CAPS: 30.0000 mg | ORAL_CAPSULE | Freq: Every evening | ORAL | Status: DC | PRN

## 2015-11-30 NOTE — Progress Notes (Signed)
   Subjective:    Patient ID: Tina Weber, female    DOB: August 11, 1981, 35 y.o.   MRN: [id number]  Depression        This is a recurrent problem.  The current episode started more than 1 month ago.   Patient in today for a 3 month recheck on Depression. No suicidal or homicidal thoughts. Has concerns of sleep aid medication. States trazodone only helping some.  Clinches teeth qhs for most nights  Disc in jaws  Total 4.5 hrs , usually not completely full.  Sleeps  Two hrs or so in a row  lunesta had s e's with bad taste, ambien not helping, on nyq etc no help   Xanax helped some, then  Seemed to cause irritablity during the day. See patient stop  Exercises a oot with  Job,  thusand sof steps per day   Goes to bed early at 7:30 pm, weighs late for much of the night. Wonders if she can add something to her current regimen no chest pain no headache no back pain no abdominal pain Review of Systems  Psychiatric/Behavioral: Positive for depression.   ROS otherwise negative     Objective:   Physical Exam  Alert vital stable HEENT normal lungs clear heart regular in rhythm neuro exam intact      Assessment & Plan:  Impression 1 depression anxiety clinically improved but still ongoing challenge #2 insomnia potentially related to 1 but also separate issue per patient. #3 daytime fatigue related to all the above plan exercise encourage compliance discussed add low-dose clonazepam continued follow-up with psychiatrist 25 minutes spent most in discussion recheck here as needed follow-up schedule bit of psychiatrist takes over meds does not have to be reevaluated fats in discussed

## 2015-12-13 ENCOUNTER — Ambulatory Visit: Payer: BLUE CROSS/BLUE SHIELD | Admitting: Family Medicine

## 2015-12-13 ENCOUNTER — Ambulatory Visit (HOSPITAL_COMMUNITY): Payer: BLUE CROSS/BLUE SHIELD

## 2015-12-13 ENCOUNTER — Other Ambulatory Visit (HOSPITAL_COMMUNITY): Payer: Self-pay | Admitting: Preventative Medicine

## 2015-12-13 DIAGNOSIS — R1032 Left lower quadrant pain: Secondary | ICD-10-CM

## 2015-12-13 DIAGNOSIS — R109 Unspecified abdominal pain: Secondary | ICD-10-CM

## 2016-01-05 ENCOUNTER — Encounter (HOSPITAL_COMMUNITY): Payer: Self-pay | Admitting: Psychiatry

## 2016-01-05 ENCOUNTER — Ambulatory Visit (INDEPENDENT_AMBULATORY_CARE_PROVIDER_SITE_OTHER): Payer: BLUE CROSS/BLUE SHIELD | Admitting: Psychiatry

## 2016-01-05 VITALS — BP 148/105 | HR 105 | Ht 64.0 in | Wt 140.4 lb

## 2016-01-05 DIAGNOSIS — F332 Major depressive disorder, recurrent severe without psychotic features: Secondary | ICD-10-CM | POA: Diagnosis not present

## 2016-01-05 MED ORDER — TRAZODONE HCL 50 MG PO TABS: 50.0000 mg | ORAL_TABLET | Freq: Every day | ORAL | Status: DC

## 2016-01-05 MED ORDER — BUPROPION HCL ER (SR) 150 MG PO TB12: 150.0000 mg | ORAL_TABLET | ORAL | Status: DC

## 2016-01-05 MED ORDER — FLUOXETINE HCL 20 MG PO CAPS: 20.0000 mg | ORAL_CAPSULE | Freq: Every day | ORAL | Status: DC

## 2016-01-05 NOTE — Progress Notes (Signed)
Psychiatric Initial Adult Assessment   Patient Identification: Tina Weber MRN:  [id number] Date of Evaluation:  01/05/2016 Referral Source:Dr. Gerda Diss Chief Complaint:   Chief Complaint    Depression; Anxiety; Establish Care     Visit Diagnosis:    ICD-9-CM ICD-10-CM   1. Severe episode of recurrent major depressive disorder, without psychotic features (HCC) 296.33 F33.2     History of Present Illness:  This patient is a 35 year old married white female who lives with her husband and 3-month-old daughter in [place]. 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  The patient was referred by her primary physician,Dr. Gerda Diss, for further assessment treatment of depression and anxiety.  The patient states that she had a short bout of depression in her teen years when her mother was diagnosed with cancer. She did fairly well after that in her 73s. She did marry a man at 32 who turned out to want an open marriage and she didn't agree with that and they ended up splitting up after 3 years. 10 years ago she developed seizures and was put on Keppra. She also developed migraine headaches. She's had seizures up until about 2 years ago.  The patient states that over the last couple of years she's had increasing symptoms of depression but it really got bad after she gave birth to her daughter 10 months ago. She became increasingly depressed unable to sleep irritable angry. While she was pregnant she felt "great". After the baby was born her physician put her on Wellbutrin which she had taken in the past. He also put her on Restoril at night to help with sleep as she was only sleeping 2 or 3 hours a night. At first this seemed to help but then the antidepressants seemed to wear off in and Cymbalta was added. This also worked a little bit at first but is no longer helping.  The patient has a very rigorous schedule. 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 She has to be awake at 2 in the morning to be at a job by 4 AM. Her daughters in daycare all day and she and her husband only have a couple of hours together with the baby at night. Her weekends are taken up by family or work. She has little time to herself.  The patient has always been a very anxious person. This may be a learned behavior to some degree because both her parents have anxiety disorder. She worries about everything and is constantly worrying about her husband or the baby. When she and her husband first got together he had an affair with another woman and she still constantly worries if she can trust him. She thinks about the same things over and over and compulsively picks at her nails. She still only sleeps 3-4 hours a night despite being on 30 mg of Restoril plus clonazepam. She's tried Ambien and Lunesta melatonin and nothing is helped. Her mother has had a good response to Prozac. She's not seen a psychiatrist before had any hospitalizations or suicidality she does not abuse drugs or alcohol  Associated Signs/Symptoms: Depression Symptoms:  depressed mood, anhedonia, psychomotor agitation, fatigue, difficulty concentrating, anxiety, disturbed sleep, decreased labido, (Hypo) Manic Symptoms:  Distractibility, Irritable Mood, Labiality of Mood, Anxiety Symptoms:  Excessive Worry, Obsessive Compulsive Symptoms:   Picking her nails, compulsive worries,    Past Psychiatric History: The patient does  have mood state changes during her periods which has leveled off some on birth control pills and also developed significant postpartum depression  Previous Psychotropic Medications: No   Substance Abuse History in the last 12 months:  No.  Consequences of Substance Abuse: NA  Past Medical History:  Past Medical History  Diagnosis Date  . Seizures (HCC)     Last seizure almost one year ago  . Depression   . Supervision of normal pregnancy  in first trimester 07/19/2014     [facility] Hortense RamalJonathan Stamos 35 yo wm 1st Dating By LMP and US Pap  07/19/14 GC/CT Initial:                36+wks: Genetic Screen NT/IT:  CF screen  Anatomic US  Flu vaccine  Tdap Recommended ~ 28wks Glucose Screen  2 hr GBS  Feed Preference  Contraception  Circumcision  Childbirth Classes  Pediatrician    . Anxiety   . Migraines     Past Surgical History  Procedure Laterality Date  . Wisdom tooth extraction    . Cholecystectomy      Family Psychiatric History: Both mother and father have anxiety problems  Family History:  Family History  Problem Relation Age of Onset  . Congestive Heart Failure Maternal Grandmother   . Diabetes Maternal Grandfather   . Hypertension Father   . Anxiety disorder Father   . Cancer Mother     thyroid  . Anxiety disorder Mother     Social History:   Social History   Social History  . Marital Status: Married    Spouse Name: N/A  . Number of Children: N/A  . Years of Education: N/A   Social History Main Topics  . Smoking status: Never Smoker   . Smokeless tobacco: Never Used  . Alcohol Use: No     Comment: 01-05-16 per pt no   . Drug Use: No     Comment: 01-05-16 per pt no  . Sexual Activity: Yes    Birth Control/ Protection: Pill   Other Topics Concern  . None   Social History Narrative    Additional Social History: [place] Her parents separated when she was 6 and both have remarried. She has a good relationship with both sets of parents. She denies any history of trauma or abuse. She finished high school and 2 years of college. She is to enjoy her work but states now under new management has been extremely stressful. 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 She has one 8164-month-old daughter  Allergies:  No Known Allergies  Metabolic Disorder Labs: No results found for: HGBA1C, MPG No results found for:  PROLACTIN No results found for: CHOL, TRIG, HDL, CHOLHDL, VLDL, LDLCALC   Current Medications: Current Outpatient Prescriptions  Medication Sig Dispense Refill  . buPROPion (WELLBUTRIN SR) 150 MG 12 hr tablet Take 1 tablet (150 mg total) by mouth every morning. 30 tablet 2  . clonazePAM (KLONOPIN) 0.25 MG disintegrating tablet Take 1 tablet by mouth as needed for sleep. 30 tablet 3  . desogestrel-ethinyl estradiol (KARIVA) 0.15-0.02/0.01 MG (21/5) tablet Take 1 tablet by mouth daily. 1 Package 11  . levETIRAcetam (KEPPRA XR) 500 MG 24 hr tablet Take 1,500 mg by mouth daily. Reported on 08/23/2015    . FLUoxetine (PROZAC) 20 MG capsule Take 1 capsule (20 mg total) by mouth daily. 30  capsule 2  . traZODone (DESYREL) 50 MG tablet Take 1 tablet (50 mg total) by mouth at bedtime. 30 tablet 2   No current facility-administered medications for this visit.    Neurologic: Headache: Yes Seizure: Yes Paresthesias:No  Musculoskeletal: Strength & Muscle Tone: within normal limits Gait & Station: normal Patient leans: N/A  Psychiatric Specialty Exam: Review of Systems  Neurological: Positive for seizures and headaches.  Psychiatric/Behavioral: Positive for depression. The patient is nervous/anxious and has insomnia.     Blood pressure 148/105, pulse 105, height 5\' 4"  (1.626 m), weight 140 lb 6.4 oz (63.685 kg), SpO2 99 %, not currently breastfeeding.Body mass index is 24.09 kg/(m^2).  General Appearance: Casual, Neat and Well Groomed  Eye Contact:  Good  Speech:  Pressured  Volume:  Normal  Mood:  Anxious and Depressed  Affect:  Depressed and Labile  Thought Process:  Goal Directed  Orientation:  Full (Time, Place, and Person)  Thought Content:  Obsessions and Rumination  Suicidal Thoughts:  No  Homicidal Thoughts:  No  Memory:  Immediate;   Good Recent;   Good Remote;   Good  Judgement:  Good  Insight:  Fair  Psychomotor Activity:  Restlessness  Concentration:  Fair  Recall:  Good   Fund of Knowledge:Good  Language: Good  Akathisia:  No  Handed:  Right  AIMS (if indicated):    Assets:  Communication Skills Desire for Improvement Physical Health Resilience Social Support Talents/Skills  ADL's:  Intact  Cognition: WNL  Sleep:  Poor, frequent awakenings through the night     Treatment Plan Summary: Medication management   This patient is a 35 year old white female with a history of depression and anxiety significant for home only driven mood changes. The medications she is tried up to now not been helpful. Since she has obsessional symptoms will go back to the standard treatment which is fluoxetine 20 mg daily. She'll Cymbalta since it is not helping and also cut back Wellbutrin to one pill a day. She'll stop her current sleep medications of Restoril and clonazepam and start trazodone 100 mg at bedtime. She will also start counseling here and return to see me in 4 weeks   [name], MD 5/19/20174:43 PM

## 2016-01-23 ENCOUNTER — Telehealth (HOSPITAL_COMMUNITY): Payer: Self-pay | Admitting: *Deleted

## 2016-01-23 NOTE — Telephone Encounter (Signed)
patient left voice message with concerns regarding medications and sleep.

## 2016-01-23 NOTE — Telephone Encounter (Signed)
returned phone call, no answer, left voice message. 

## 2016-01-23 NOTE — Telephone Encounter (Signed)
Per pt she is having hard time with the switch of her medications. Per pt, she is very emotional, and having a hard time time going to sleep. Per pt, she do not know which medication could be causing this but her medications have been changed for about a week now. Per pt, the day before yesterday was a really bad day. Per pt, she is not suicidal. Pt number is [phone]

## 2016-01-24 NOTE — Telephone Encounter (Signed)
Lmtcb. If you speak to her tell her she can go back to restoril and clonazepam at bedtime if trazodone is not helping her sleep

## 2016-01-24 NOTE — Telephone Encounter (Signed)
lmtcb

## 2016-01-25 ENCOUNTER — Telehealth: Payer: Self-pay | Admitting: Family Medicine

## 2016-01-25 DIAGNOSIS — R569 Unspecified convulsions: Secondary | ICD-10-CM

## 2016-01-25 NOTE — Telephone Encounter (Signed)
Per pt, once she gets to sleep, it helps her sleep all the way through with the Trazodone. Per pt, she's getting a little bit more sleep now. Per pt, her biggest worry is she's getting upset for no reason. Informed pt of what provider stated and to try to give the medicine some time to work and call office back. Pt agreed and verbalized understanding.

## 2016-01-25 NOTE — Telephone Encounter (Signed)
noted 

## 2016-01-25 NOTE — Telephone Encounter (Signed)
Pt is needing a referral to Dr. Gerilyn [place]. Pt has a neurologist but it is too far away from her.

## 2016-01-25 NOTE — Telephone Encounter (Signed)
Let's do 

## 2016-01-26 NOTE — Telephone Encounter (Signed)
Left message on voicemail notifying patient that referral was initiated.

## 2016-01-30 ENCOUNTER — Encounter: Payer: Self-pay | Admitting: Family Medicine

## 2016-02-01 ENCOUNTER — Ambulatory Visit (INDEPENDENT_AMBULATORY_CARE_PROVIDER_SITE_OTHER): Payer: BLUE CROSS/BLUE SHIELD | Admitting: Psychiatry

## 2016-02-01 ENCOUNTER — Encounter (HOSPITAL_COMMUNITY): Payer: Self-pay | Admitting: Psychiatry

## 2016-02-01 ENCOUNTER — Ambulatory Visit (HOSPITAL_COMMUNITY): Payer: Self-pay | Admitting: Psychiatry

## 2016-02-01 VITALS — BP 140/120 | Ht 64.0 in | Wt 143.0 lb

## 2016-02-01 DIAGNOSIS — F332 Major depressive disorder, recurrent severe without psychotic features: Secondary | ICD-10-CM | POA: Diagnosis not present

## 2016-02-01 MED ORDER — FLUOXETINE HCL 20 MG PO CAPS: 20.0000 mg | ORAL_CAPSULE | Freq: Two times a day (BID) | ORAL | Status: DC

## 2016-02-01 MED ORDER — CLONAZEPAM 0.5 MG PO TABS: ORAL_TABLET | ORAL | Status: DC

## 2016-02-01 MED ORDER — TRAZODONE HCL 100 MG PO TABS: 100.0000 mg | ORAL_TABLET | Freq: Every day | ORAL | Status: DC

## 2016-02-01 NOTE — Progress Notes (Signed)
Patient ID: Tina Weber, female   DOB: 22-Oct-1980, 35 y.o.   MRN: [id number]  Psychiatric Initial Adult Assessment   Patient Identification: Tina Weber MRN:  [id number] Date of Evaluation:  02/01/2016 Referral Source:Dr. Gerda Diss Chief Complaint:   Chief Complaint    Depression; Anxiety; Follow-up     Visit Diagnosis:    ICD-9-CM ICD-10-CM   1. Severe episode of recurrent major depressive disorder, without psychotic features (HCC) 296.33 F33.2     History of Present Illness:  This patient is a 35 year old married white female who lives with her husband and 49-month-old daughter in [place]. 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  The patient was referred by her primary physician,Dr. Gerda Diss, for further assessment treatment of depression and anxiety.  The patient states that she had a short bout of depression in her teen years when her mother was diagnosed with cancer. She did fairly well after that in her 29s. She did marry a man at 46 who turned out to want an open marriage and she didn't agree with that and they ended up splitting up after 3 years. 10 years ago she developed seizures and was put on Keppra. She also developed migraine headaches. She's had seizures up until about 2 years ago.  The patient states that over the last couple of years she's had increasing symptoms of depression but it really got bad after she gave birth to her daughter 10 months ago. She became increasingly depressed unable to sleep irritable angry. While she was pregnant she felt "great". After the baby was born her physician put her on Wellbutrin which she had taken in the past. He also put her on Restoril at night to help with sleep as she was only sleeping 2 or 3 hours a night. At first this seemed to help but then the antidepressants seemed to wear off in and Cymbalta was added. This also worked a little bit at first but is no longer helping.  The patient has a  very rigorous schedule. 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 She has to be awake at 2 in the morning to be at a job by 4 AM. Her daughters in daycare all day and she and her husband only have a couple of hours together with the baby at night. Her weekends are taken up by family or work. She has little time to herself.  The patient has always been a very anxious person. This may be a learned behavior to some degree because both her parents have anxiety disorder. She worries about everything and is constantly worrying about her husband or the baby. When she and her husband first got together he had an affair with another woman and she still constantly worries if she can trust him. She thinks about the same things over and over and compulsively picks at her nails. She still only sleeps 3-4 hours a night despite being on 30 mg of Restoril plus clonazepam. She's tried Ambien and Lunesta melatonin and nothing is helped. Her mother has had a good response to Prozac. She's not seen a psychiatrist before had any hospitalizations or suicidality she does not abuse drugs or alcohol  The patient returns after 3 weeks as a work in today. She states that she had a "breakdown" at work 2 days ago. The demands of work of gotten worse. She is working 10 hour shifts  6 days a week. She feels extremely stressed and has not been able to calm down. She's constantly anxious and shaky. Her level of worry and obsession out he has worsened. The other day her husband found a tick on the baby which had not bitten the baby. She states that she blew this out of proportion cried and screamed. She is sleeping somewhat better on the trazodone but only 4-5 hours a night and awakens constantly. She is exhausted and feels like she can't concentrate or function. She has found a counselor in [place] and she is seen her twice already and will see her again in one week which has been helpful. The  counselor suggested I take her out of work and I agree that she needs a break to recuperate for at least 8 weeks.  We also discussed increasing the Prozac to help her obsessionality and getting off Wellbutrin. She'll also increase trazodone to 100 mg to help with sleep and increase clonazepam to 0.5 mg twice a day and 1 mg at bedtime to help anxiety.  Associated Signs/Symptoms: Depression Symptoms:  depressed mood, anhedonia, psychomotor agitation, fatigue, difficulty concentrating, anxiety, disturbed sleep, decreased labido, (Hypo) Manic Symptoms:  Distractibility, Irritable Mood, Labiality of Mood, Anxiety Symptoms:  Excessive Worry, Obsessive Compulsive Symptoms:   Picking her nails, compulsive worries,    Past Psychiatric History: The patient does have mood state changes during her periods which has leveled off some on birth control pills and also developed significant postpartum depression  Previous Psychotropic Medications: No   Substance Abuse History in the last 12 months:  No.  Consequences of Substance Abuse: NA  Past Medical History:  Past Medical History  Diagnosis Date  . Seizures (HCC)     Last seizure almost one year ago  . Depression   . Supervision of normal pregnancy in first trimester 07/19/2014     [facility] Hortense RamalJonathan Pimenta 35 yo wm 1st Dating By LMP and US Pap  07/19/14 GC/CT Initial:                36+wks: Genetic Screen NT/IT:  CF screen  Anatomic US  Flu vaccine  Tdap Recommended ~ 28wks Glucose Screen  2 hr GBS  Feed Preference  Contraception  Circumcision  Childbirth Classes  Pediatrician    . Anxiety   . Migraines     Past Surgical History  Procedure Laterality Date  . Wisdom tooth extraction    . Cholecystectomy      Family Psychiatric History: Both mother and father have anxiety problems  Family History:  Family History  Problem Relation Age of Onset  . Congestive Heart Failure Maternal Grandmother   . Diabetes Maternal  Grandfather   . Hypertension Father   . Anxiety disorder Father   . Cancer Mother     thyroid  . Anxiety disorder Mother     Social History:   Social History   Social History  . Marital Status: Married    Spouse Name: N/A  . Number of Children: N/A  . Years of Education: N/A   Social History Main Topics  . Smoking status: Never Smoker   . Smokeless tobacco: Never Used  . Alcohol Use: No     Comment: 01-05-16 per pt no   . Drug Use: No     Comment: 01-05-16 per pt no  . Sexual Activity: Yes    Birth Control/ Protection: Pill   Other Topics Concern  . None   Social History  Narrative    Additional Social History: The patient grew up in [facility] [place]. Her parents separated when she was 6 and both have remarried. She has a good relationship with both sets of parents. She denies any history of trauma or abuse. She finished high school and 2 years of college. She is to enjoy her work but states now under new management has been extremely stressful. 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 She has one 79-month-old daughter  Allergies:  No Known Allergies  Metabolic Disorder Labs: No results found for: HGBA1C, MPG No results found for: PROLACTIN No results found for: CHOL, TRIG, HDL, CHOLHDL, VLDL, LDLCALC   Current Medications: Current Outpatient Prescriptions  Medication Sig Dispense Refill  . clonazePAM (KLONOPIN) 0.5 MG tablet Take one twice a day and two at bedtime 120 tablet 2  . desogestrel-ethinyl estradiol (KARIVA) 0.15-0.02/0.01 MG (21/5) tablet Take 1 tablet by mouth daily. 1 Package 11  . FLUoxetine (PROZAC) 20 MG capsule Take 1 capsule (20 mg total) by mouth 2 (two) times daily. 60 capsule 2  . levETIRAcetam (KEPPRA XR) 500 MG 24 hr tablet Take 1,500 mg by mouth daily. Reported on 08/23/2015    . traZODone (DESYREL) 100 MG tablet Take 1 tablet (100 mg total) by mouth at bedtime. 30 tablet 2   No current  facility-administered medications for this visit.    Neurologic: Headache: Yes Seizure: Yes Paresthesias:No  Musculoskeletal: Strength & Muscle Tone: within normal limits Gait & Station: normal Patient leans: N/A  Psychiatric Specialty Exam: Review of Systems  Neurological: Positive for seizures and headaches.  Psychiatric/Behavioral: Positive for depression. The patient is nervous/anxious and has insomnia.     Blood pressure 140/120, height 5\' 4"  (1.626 m), weight 143 lb (64.864 kg), not currently breastfeeding.Body mass index is 24.53 kg/(m^2).  General Appearance: Casual, Neat and Well Groomed  Eye Contact:  Good  Speech:  Pressured  Volume:  Normal  Mood:  Anxious and Depressed  Affect:  Depressed and worried   Thought Process:  Goal Directed  Orientation:  Full (Time, Place, and Person)  Thought Content:  Obsessions and Rumination  Suicidal Thoughts:  No  Homicidal Thoughts:  No  Memory:  Immediate;   Good Recent;   Good Remote;   Good  Judgement:  Good  Insight:  Fair  Psychomotor Activity:  Restlessness  Concentration:  Fair  Recall:  Good  Fund of Knowledge:Good  Language: Good  Akathisia:  No  Handed:  Right  AIMS (if indicated):    Assets:  Communication Skills Desire for Improvement Physical Health Resilience Social Support Talents/Skills  ADL's:  Intact  Cognition: WNL  Sleep:  Poor, frequent awakenings through the night     Treatment Plan Summary: Medication management   I have written a letter to recommend the patient be taken out of work for 8 weeks to recuperate. She will increase trazodone to 100 mg at bedtime, increase Prozac to 20 mg twice a day for depression and increase clonazepam to 0.5 mg twice a day and 1 mg at night for anxiety. She'll continue her counseling and return to see me in 3 weeks   [name], MD 6/15/201711:21 AM

## 2016-02-07 ENCOUNTER — Ambulatory Visit (HOSPITAL_COMMUNITY): Payer: Self-pay | Admitting: Psychiatry

## 2016-02-08 ENCOUNTER — Ambulatory Visit (HOSPITAL_COMMUNITY): Payer: Self-pay | Admitting: Psychiatry

## 2016-03-05 ENCOUNTER — Telehealth (HOSPITAL_COMMUNITY): Payer: Self-pay | Admitting: *Deleted

## 2016-03-05 ENCOUNTER — Ambulatory Visit (HOSPITAL_COMMUNITY): Payer: Self-pay | Admitting: Psychiatry

## 2016-03-05 NOTE — Telephone Encounter (Signed)
left voice message regarding requested information for [employer] [facility] [employer].

## 2016-03-26 ENCOUNTER — Encounter (HOSPITAL_COMMUNITY): Payer: Self-pay | Admitting: Psychiatry

## 2016-03-26 ENCOUNTER — Ambulatory Visit (INDEPENDENT_AMBULATORY_CARE_PROVIDER_SITE_OTHER): Payer: BLUE CROSS/BLUE SHIELD | Admitting: Psychiatry

## 2016-03-26 VITALS — BP 109/83 | HR 69 | Ht 64.0 in | Wt 145.4 lb

## 2016-03-26 DIAGNOSIS — F332 Major depressive disorder, recurrent severe without psychotic features: Secondary | ICD-10-CM

## 2016-03-26 MED ORDER — CLONAZEPAM 1 MG PO TABS: 1.0000 mg | ORAL_TABLET | Freq: Three times a day (TID) | ORAL | 2 refills | Status: DC

## 2016-03-26 MED ORDER — TRAZODONE HCL 100 MG PO TABS: 200.0000 mg | ORAL_TABLET | Freq: Every day | ORAL | 2 refills | Status: DC

## 2016-03-26 MED ORDER — FLUOXETINE HCL 20 MG PO CAPS: 20.0000 mg | ORAL_CAPSULE | Freq: Two times a day (BID) | ORAL | 2 refills | Status: DC

## 2016-03-26 NOTE — Progress Notes (Signed)
Patient ID: Tina Weber, female   DOB: [birth year], 35 y.o.   MRN: [id number]  Psychiatric Initial Adult Assessment   Patient Identification: DALLIS DARDEN MRN:  [id number] Date of Evaluation:  03/26/2016 Referral Source:Dr. Gerda Diss Chief Complaint:   Chief Complaint    Anxiety; Depression; Follow-up     Visit Diagnosis:    ICD-9-CM ICD-10-CM   1. Severe episode of recurrent major depressive disorder, without psychotic features (HCC) 296.33 F33.2     History of Present Illness:  This patient is a 35 year old married white female who lives with her husband and 42-month-old daughter in [place]. 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  The patient was referred by her primary physician,Dr. Gerda Diss, for further assessment treatment of depression and anxiety.  The patient states that she had a short bout of depression in her teen years when her mother was diagnosed with cancer. She did fairly well after that in her 91s. She did marry a man at 44 who turned out to want an open marriage and she didn't agree with that and they ended up splitting up after 3 years. 10 years ago she developed seizures and was put on Keppra. She also developed migraine headaches. She's had seizures up until about 2 years ago.  The patient states that over the last couple of years she's had increasing symptoms of depression but it really got bad after she gave birth to her daughter 10 months ago. She became increasingly depressed unable to sleep irritable angry. While she was pregnant she felt "great". After the baby was born her physician put her on Wellbutrin which she had taken in the past. He also put her on Restoril at night to help with sleep as she was only sleeping 2 or 3 hours a night. At first this seemed to help but then the antidepressants seemed to wear off in and Cymbalta was added. This also worked a little bit at first but is no longer helping.  The patient has a  very rigorous schedule. 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 She has to be awake at 2 in the morning to be at a job by 4 AM. Her daughters in daycare all day and she and her husband only have a couple of hours together with the baby at night. Her weekends are taken up by family or work. She has little time to herself.  The patient has always been a very anxious person. This may be a learned behavior to some degree because both her parents have anxiety disorder. She worries about everything and is constantly worrying about her husband or the baby. When she and her husband first got together he had an affair with another woman and she still constantly worries if she can trust him. She thinks about the same things over and over and compulsively picks at her nails. She still only sleeps 3-4 hours a night despite being on 30 mg of Restoril plus clonazepam. She's tried Ambien and Lunesta melatonin and nothing is helped. Her mother has had a good response to Prozac. She's not seen a psychiatrist before had any hospitalizations or suicidality she does not abuse drugs or alcohol  The patient returns after 2 months. In some way she is somewhat better in terms of depression but she's very anxious and worried. She is going to a therapist in [place] and the therapist is also  doing marriage counseling and seeing her husband individually. Her husband has been very demanding and critical of her and they are trying to work on these things. Lately however he is been very withdrawn and not communicating and this is made her anxiety a lot worse. She is been waking up a lot through the night and not getting good sleep. She denies any thoughts of hurting self or others but is having difficulty with concentration and focus. She states that she feels "lost" and doesn't know what direction her marriage is having. She is very anxious and doesn't feel ready to return to work as scheduled  next week  Associated Signs/Symptoms: Depression Symptoms:  depressed mood, anhedonia, psychomotor agitation, fatigue, difficulty concentrating, anxiety, disturbed sleep, decreased labido, (Hypo) Manic Symptoms:  Distractibility, Irritable Mood, Labiality of Mood, Anxiety Symptoms:  Excessive Worry, Obsessive Compulsive Symptoms:   Picking her nails, compulsive worries,    Past Psychiatric History: The patient does have mood state changes during her periods which has leveled off some on birth control pills and also developed significant postpartum depression  Previous Psychotropic Medications: No   Substance Abuse History in the last 12 months:  No.  Consequences of Substance Abuse: NA  Past Medical History:  Past Medical History:  Diagnosis Date  . Anxiety   . Depression   . Migraines   . Seizures (HCC)    Last seizure almost one year ago  . Supervision of normal pregnancy in first trimester 07/19/2014    [facility] breia ocampo 35 yo wm 1st Dating By LMP and [place] Pap  07/19/14 GC/CT Initial:                36+wks: Genetic Screen NT/IT:  CF screen  Anatomic [place]  Flu vaccine  Tdap Recommended ~ 28wks Glucose Screen  2 hr GBS  Feed Preference  Contraception  Circumcision  Childbirth Classes  Pediatrician      Past Surgical History:  Procedure Laterality Date  . CHOLECYSTECTOMY    . WISDOM TOOTH EXTRACTION      Family Psychiatric History: Both mother and father have anxiety problems  Family History:  Family History  Problem Relation Age of Onset  . Congestive Heart Failure Maternal Grandmother   . Diabetes Maternal Grandfather   . Hypertension Father   . Anxiety disorder Father   . Cancer Mother     thyroid  . Anxiety disorder Mother     Social History:   Social History   Social History  . Marital status: Married    Spouse name: N/A  . Number of children: N/A  . Years of education: N/A   Social History Main Topics  . Smoking status: Never  Smoker  . Smokeless tobacco: Never Used  . Alcohol use No     Comment: 01-05-16 per pt no   . Drug use: No     Comment: 01-05-16 per pt no  . Sexual activity: Yes    Birth control/ protection: Pill   Other Topics Concern  . None   Social History Narrative  . None    Additional Social History: The patient grew up in [place]. Her parents separated when she was 6 and both have remarried. She has a good relationship with both sets of parents. She denies any history of trauma or abuse. She finished high school and 2 years of college. She is to enjoy her work but states now under new management has been extremely stressful. 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 She has one 348-month-old daughter  Allergies:  No Known Allergies  Metabolic Disorder Labs: No results found for: HGBA1C, MPG No results found for: PROLACTIN No results found for: CHOL, TRIG, HDL, CHOLHDL, VLDL, LDLCALC   Current Medications: Current Outpatient Prescriptions  Medication Sig Dispense Refill  . desogestrel-ethinyl estradiol (KARIVA) 0.15-0.02/0.01 MG (21/5) tablet Take 1 tablet by mouth daily. 1 Package 11  . FLUoxetine (PROZAC) 20 MG capsule Take 1 capsule (20 mg total) by mouth 2 (two) times daily. 60 capsule 2  . levETIRAcetam (KEPPRA XR) 500 MG 24 hr tablet Take 1,500 mg by mouth daily. Reported on 08/23/2015    . oxyCODONE-acetaminophen (PERCOCET) 5-325 MG tablet Take 1-2 tablets by mouth as needed for severe pain.    . traZODone (DESYREL) 100 MG tablet Take 2 tablets (200 mg total) by mouth at bedtime. 60 tablet 2  . clonazePAM (KLONOPIN) 1 MG tablet Take 1 tablet (1 mg total) by mouth 3 (three) times daily. 90 tablet 2   No current facility-administered medications for this visit.     Neurologic: Headache: Yes Seizure: Yes Paresthesias:No  Musculoskeletal: Strength & Muscle Tone: within normal limits Gait & Station: normal Patient  leans: N/A  Psychiatric Specialty Exam: Review of Systems  Neurological: Positive for seizures and headaches.  Psychiatric/Behavioral: Positive for depression. The patient is nervous/anxious and has insomnia.     Blood pressure 109/83, pulse 69, height 5\' 4"  (1.626 m), weight 145 lb 6.4 oz (66 kg), SpO2 98 %, not currently breastfeeding.Body mass index is 24.96 kg/m.  General Appearance: Casual, Neat and Well Groomed  Eye Contact:  Good  Speech:  Normal   Volume:  Normal  Mood:  Anxious and Depressed  Affect:  Depressed and worried   Thought Process:  Goal Directed  Orientation:  Full (Time, Place, and Person)  Thought Content:  Obsessions and Rumination  Suicidal Thoughts:  No  Homicidal Thoughts:  No  Memory:  Immediate;   Good Recent;   Good Remote;   Good  Judgement:  Good  Insight:  Fair  Psychomotor Activity:  Restlessness  Concentration:  Fair  Recall:  Good  Fund of Knowledge:Good  Language: Good  Akathisia:  No  Handed:  Right  AIMS (if indicated):    Assets:  Communication Skills Desire for Improvement Physical Health Resilience Social Support Talents/Skills  ADL's:  Intact  Cognition: WNL  Sleep:  Poor, frequent awakenings through the night     Treatment Plan Summary: Medication management   The patient will continue trazodone but increase the dosage to 150 or 200 mg at bedtime to help with sleep. She'll continue Prozac 40 mg daily for depression. She'll increase clonazepam 1 mg 3 times a day for anxiety. I recommended that she stay out of work until 05/03/2016. She'll return to see me in 4 weeks to reevaluate how she is doing   Diannia RuderOSS, Luane Rochon, MD 8/8/20173:32 PM

## 2016-04-02 ENCOUNTER — Other Ambulatory Visit: Payer: Self-pay | Admitting: Obstetrics & Gynecology

## 2016-04-03 ENCOUNTER — Telehealth (HOSPITAL_COMMUNITY): Payer: Self-pay | Admitting: *Deleted

## 2016-04-03 NOTE — Telephone Encounter (Signed)
phone call, left voice message for patient to call us.  We were talking and the call was disconnected.

## 2016-04-05 ENCOUNTER — Telehealth (HOSPITAL_COMMUNITY): Payer: Self-pay | Admitting: *Deleted

## 2016-04-05 NOTE — Telephone Encounter (Signed)
spoke with patient again regarding a release for [employer].   She said she will be by on Monday 04/08/16 to sign release.

## 2016-04-10 ENCOUNTER — Other Ambulatory Visit: Payer: BLUE CROSS/BLUE SHIELD | Admitting: Women's Health

## 2016-04-10 ENCOUNTER — Telehealth: Payer: Self-pay | Admitting: Women's Health

## 2016-04-10 NOTE — Telephone Encounter (Signed)
Pt informed [facility] e-scribed on 04/02/2016 with 11 refills. Pt verbalized understanding.

## 2016-04-24 ENCOUNTER — Other Ambulatory Visit: Payer: BLUE CROSS/BLUE SHIELD | Admitting: Women's Health

## 2016-04-25 ENCOUNTER — Telehealth (HOSPITAL_COMMUNITY): Payer: Self-pay | Admitting: *Deleted

## 2016-04-25 NOTE — Telephone Encounter (Signed)
Called pt to resch appt due to provider being out on 04-29-16. lmtcb and office number was provided.  

## 2016-04-25 NOTE — Telephone Encounter (Signed)
left voice message, provider out of office on 04/29/16.

## 2016-04-26 ENCOUNTER — Encounter (HOSPITAL_COMMUNITY): Payer: Self-pay | Admitting: Psychiatry

## 2016-04-26 ENCOUNTER — Ambulatory Visit (INDEPENDENT_AMBULATORY_CARE_PROVIDER_SITE_OTHER): Payer: BLUE CROSS/BLUE SHIELD | Admitting: Psychiatry

## 2016-04-26 VITALS — BP 104/65 | HR 73 | Ht 64.0 in | Wt 147.8 lb

## 2016-04-26 DIAGNOSIS — F332 Major depressive disorder, recurrent severe without psychotic features: Secondary | ICD-10-CM | POA: Diagnosis not present

## 2016-04-26 NOTE — Patient Instructions (Signed)
Take rexulti 0.5 mg for one week, then 1 mg at bedtime

## 2016-04-26 NOTE — Progress Notes (Signed)
Patient ID: Robet LeuJennifer D Kirchoff, female   DOB: [birth year], 35 y.o.   MRN: [id number]  Psychiatric Initial Adult Assessment   Patient Identification: Robet LeuJennifer D Heikes MRN:  [id number] Date of Evaluation:  04/26/2016 Referral Source:Dr. Gerda DissLuking Chief Complaint:   Chief Complaint    Depression; Anxiety; Follow-up     Visit Diagnosis:    ICD-9-CM ICD-10-CM   1. Severe episode of recurrent major depressive disorder, without psychotic features (HCC) 296.33 F33.2     History of Present Illness:  This patient is a 35 year old married white female who lives with her husband and 4073-month-old daughter in [place]. 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  The patient was referred by her primary physician,Dr. Gerda DissLuking, for further assessment treatment of depression and anxiety.  The patient states that she had a short bout of depression in her teen years when her mother was diagnosed with cancer. She did fairly well after that in her 2920s. She did marry a man at [age] who turned out to want an open marriage and she didn't agree with that and they ended up splitting up after 3 years. 10 years ago she developed seizures and was put on Keppra. She also developed migraine headaches. She's had seizures up until about 2 years ago.  The patient states that over the last couple of years she's had increasing symptoms of depression but it really got bad after she gave birth to her daughter 10 months ago. She became increasingly depressed unable to sleep irritable angry. While she was pregnant she felt "great". After the baby was born her physician put her on Wellbutrin which she had taken in the past. He also put her on Restoril at night to help with sleep as she was only sleeping 2 or 3 hours a night. At first this seemed to help but then the antidepressants seemed to wear off in and Cymbalta was added. This also worked a little bit at first but is no longer helping.  The patient has a  very rigorous schedule. 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 She has to be awake at 2 in the morning to be at a job by 4 AM. Her daughters in daycare all day and she and her husband only have a couple of hours together with the baby at night. Her weekends are taken up by family or work. She has little time to herself.  The patient has always been a very anxious person. This may be a learned behavior to some degree because both her parents have anxiety disorder. She worries about everything and is constantly worrying about her husband or the baby. When she and her husband first got together he had an affair with another woman and she still constantly worries if she can trust him. She thinks about the same things over and over and compulsively picks at her nails. She still only sleeps 3-4 hours a night despite being on 30 mg of Restoril plus clonazepam. She's tried Ambien and Lunesta melatonin and nothing is helped. Her mother has had a good response to Prozac. She's not seen a psychiatrist before had any hospitalizations or suicidality she does not abuse drugs or alcohol  The patient returns after 4 weeks. Last time we increased Prozac and clonazepam doses. I also suggested an increase in trazodone but when she tried this she slept too long and couldn't get her baby up  in the morning. She's back to 100 mg of trazodone and is sleeping well. She still feels glum or blue a fair amount a time but not as severely depressed as she did before. She and her husband are going to counseling and they're getting along much better. She is much less anxious today but still doesn't feel comfortable being alone and often calls her mother to stay with her. She does feel ready to start work on September 18 as we had planned. Given that she is still somewhat down and I suggested we add Rexulti for augmentation  Associated Signs/Symptoms: Depression Symptoms:  depressed  mood, anhedonia, psychomotor agitation, fatigue, difficulty concentrating, anxiety, disturbed sleep, decreased labido, (Hypo) Manic Symptoms:  Distractibility, Irritable Mood, Labiality of Mood, Anxiety Symptoms:  Excessive Worry, Obsessive Compulsive Symptoms:   Picking her nails, compulsive worries,    Past Psychiatric History: The patient does have mood state changes during her periods which has leveled off some on birth control pills and also developed significant postpartum depression  Previous Psychotropic Medications: No   Substance Abuse History in the last 12 months:  No.  Consequences of Substance Abuse: NA  Past Medical History:  Past Medical History:  Diagnosis Date  . Anxiety   . Depression   . Migraines   . Seizures (HCC)    Last seizure almost one year ago  . Supervision of normal pregnancy in first trimester 07/19/2014    [facility] girtrude enslin 35 yo wm 1st Dating By LMP and [place] Pap  07/19/14 GC/CT Initial:                36+wks: Genetic Screen NT/IT:  CF screen  Anatomic [place]  Flu vaccine  Tdap Recommended ~ 28wks Glucose Screen  2 hr GBS  Feed Preference  Contraception  Circumcision  Childbirth Classes  Pediatrician      Past Surgical History:  Procedure Laterality Date  . CHOLECYSTECTOMY    . WISDOM TOOTH EXTRACTION      Family Psychiatric History: Both mother and father have anxiety problems  Family History:  Family History  Problem Relation Age of Onset  . Congestive Heart Failure Maternal Grandmother   . Diabetes Maternal Grandfather   . Hypertension Father   . Anxiety disorder Father   . Cancer Mother     thyroid  . Anxiety disorder Mother     Social History:   Social History   Social History  . Marital status: Married    Spouse name: N/A  . Number of children: N/A  . Years of education: N/A   Social History Main Topics  . Smoking status: Never Smoker  . Smokeless tobacco: Never Used  . Alcohol use No     Comment:  01-05-16 per pt no   . Drug use: No     Comment: 01-05-16 per pt no  . Sexual activity: Yes    Birth control/ protection: Pill   Other Topics Concern  . None   Social History Narrative  . None    Additional Social History: The patient grew up in [place]. Her parents separated when she was 6 and both have remarried. She has a good relationship with both sets of parents. She denies any history of trauma or abuse. She finished high school and 2 years of college. She is to enjoy her work but states now under new management has been extremely stressful. 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 She has one 5-month-old daughter  Allergies:  No Known Allergies  Metabolic Disorder Labs: No results found for: HGBA1C, MPG No results found for: PROLACTIN No results found for: CHOL, TRIG, HDL, CHOLHDL, VLDL, LDLCALC   Current Medications: Current Outpatient Prescriptions  Medication Sig Dispense Refill  . AZURETTE 0.15-0.02/0.01 MG (21/5) tablet TAKE ONE TABLET BY MOUTH ONCE DAILY 28 tablet 11  . clonazePAM (KLONOPIN) 1 MG tablet Take 1 tablet (1 mg total) by mouth 3 (three) times daily. 90 tablet 2  . FLUoxetine (PROZAC) 20 MG capsule Take 1 capsule (20 mg total) by mouth 2 (two) times daily. 60 capsule 2  . levETIRAcetam (KEPPRA XR) 500 MG 24 hr tablet Take 1,500 mg by mouth daily. Reported on 08/23/2015    . oxyCODONE-acetaminophen (PERCOCET) 5-325 MG tablet Take 1-2 tablets by mouth as needed for severe pain.    . traZODone (DESYREL) 100 MG tablet Take 2 tablets (200 mg total) by mouth at bedtime. 60 tablet 2   No current facility-administered medications for this visit.     Neurologic: Headache: Yes Seizure: Yes Paresthesias:No  Musculoskeletal: Strength & Muscle Tone: within normal limits Gait & Station: normal Patient leans: N/A  Psychiatric Specialty Exam: Review of Systems  Neurological: Positive for seizures  and headaches.  Psychiatric/Behavioral: Positive for depression. The patient is nervous/anxious and has insomnia.     Blood pressure 104/65, pulse 73, height 5\' 4"  (1.626 m), weight 147 lb 12.8 oz (67 kg), not currently breastfeeding.Body mass index is 25.37 kg/m.  General Appearance: Casual, Neat and Well Groomed  Eye Contact:  Good  Speech:  Normal   Mood: A little anxious, much less depressed     Affect:  Brighter   Thought Process:  Goal Directed  Orientation:  Full (Time, Place, and Person)  Thought Content:  Obsessions and Rumination  Suicidal Thoughts:  No  Homicidal Thoughts:  No  Memory:  Immediate;   Good Recent;   Good Remote;   Good  Judgement:  Good  Insight:  Fair  Psychomotor Activity:  Restlessness  Concentration:  Fair  Recall:  Good  Fund of Knowledge:Good  Language: Good  Akathisia:  No  Handed:  Right  AIMS (if indicated):    Assets:  Communication Skills Desire for Improvement Physical Health Resilience Social Support Talents/Skills  ADL's:  Intact  Cognition: WNL  Sleep:  Poor, frequent awakenings through the night     Treatment Plan Summary: Medication management   The patient will continue trazodone 100 mg at bedtime to help with sleep. She'll continue Prozac 40 mg daily for depression. She'll continue e clonazepam 1 mg 3 times a day for anxiety. Rexulti samples have been given to started 0.5 mg at bedtime for 1 week and then advance to 1 mg at bed time I recommended that she stay out of work until 09/182017. She'll return to see me in 4 weeks to reevaluate how she is doing   [name], MD 9/8/20179:04 AM Patient ID: [name], female   DOB: 03-Nov-1980, 35 y.o.   MRN: [id number]

## 2016-04-29 ENCOUNTER — Ambulatory Visit (HOSPITAL_COMMUNITY): Payer: Self-pay | Admitting: Psychiatry

## 2016-05-14 ENCOUNTER — Encounter: Payer: Self-pay | Admitting: Family Medicine

## 2016-05-21 ENCOUNTER — Encounter (HOSPITAL_COMMUNITY): Payer: Self-pay

## 2016-05-21 ENCOUNTER — Ambulatory Visit (HOSPITAL_COMMUNITY): Payer: Self-pay | Admitting: Psychiatry

## 2016-09-06 ENCOUNTER — Other Ambulatory Visit (HOSPITAL_COMMUNITY): Payer: Self-pay | Admitting: Psychiatry

## 2016-09-11 ENCOUNTER — Telehealth: Payer: Self-pay | Admitting: Family Medicine

## 2016-09-11 NOTE — Telephone Encounter (Signed)
Never been seen for back pain here. Called pt and scheduled her office visit for tomorrow.

## 2016-09-11 NOTE — Telephone Encounter (Signed)
Pt got disconnected while holding. Please call and schedule appt for tomorrow with dr Brett Canalessteve for back pain.

## 2016-09-11 NOTE — Telephone Encounter (Signed)
Pt's chiropractor recommended for her to contact her PCP to request something for her mid to upper back pain (having back pain for over a year Z& just started seeing chiropractor today)  Got an adjustment today but will need some pain meds   Please advise   [facility]

## 2016-09-12 ENCOUNTER — Ambulatory Visit (INDEPENDENT_AMBULATORY_CARE_PROVIDER_SITE_OTHER): Payer: BLUE CROSS/BLUE SHIELD | Admitting: Family Medicine

## 2016-09-12 ENCOUNTER — Encounter: Payer: Self-pay | Admitting: Family Medicine

## 2016-09-12 VITALS — BP 144/98 | Ht 64.0 in | Wt 164.0 lb

## 2016-09-12 DIAGNOSIS — M545 Low back pain, unspecified: Secondary | ICD-10-CM

## 2016-09-12 DIAGNOSIS — G8929 Other chronic pain: Secondary | ICD-10-CM | POA: Diagnosis not present

## 2016-09-12 MED ORDER — TIZANIDINE HCL 4 MG PO TABS: 4.0000 mg | ORAL_TABLET | Freq: Three times a day (TID) | ORAL | 0 refills | Status: DC | PRN

## 2016-09-12 MED ORDER — ETODOLAC 400 MG PO TABS: 400.0000 mg | ORAL_TABLET | Freq: Two times a day (BID) | ORAL | 0 refills | Status: DC

## 2016-09-12 MED ORDER — HYDROCODONE-ACETAMINOPHEN 5-325 MG PO TABS: 1.0000 | ORAL_TABLET | Freq: Every day | ORAL | 0 refills | Status: DC

## 2016-09-12 NOTE — Telephone Encounter (Signed)
[facility] for pt to schedule appt with Dr. Brett CanalesSteve today for back pain

## 2016-09-12 NOTE — Telephone Encounter (Signed)
Appt scheduled

## 2016-09-12 NOTE — Progress Notes (Signed)
   Subjective:    Patient ID: Tina Weber, female    DOB: [birth year], 36 y.o.   MRN: [id number]  Back Pain  This is a chronic problem. The current episode started more than 1 year ago. The pain is present in the thoracic spine. Treatments tried: otc meds, heat, cold packs, muscle relaxers, injections.   Has had back pain for yrs, worse the past yr  Saw obgyn, muscle relaxers and xylociane needle injections did not help  Twice to the chiroproactor Pain is diffuse across the low back. Some radiation into the legs but not a lot. Deep ache. Often painful. Leading to difficulty working and difficulty maintaining activities at home.   Exercise stays active with wpork  No majr exercises for back some stretching  aleave and advil and tylenol and other otc pain meds, have tried essentiall oils   No hx of prior chiropractor intervention      Review of Systems  Musculoskeletal: Positive for back pain.  No abdominal pain no vomiting no chest pain     Objective:   Physical Exam  Alert tearful anxious appearing HEENT normal lungs clear heart regular rhythm diffuse tenderness across the lumbar region minimal evidence of straight leg raise.      Assessment & Plan:  Impression flare of chronic back pain. Still in the midst of workup. Patient states do to get x-rays to mild chiropractor's we will hold off. Chiropractor device her to come here for pain medicine. Advised patient we are opposed to long-term narcotics for pain such as this. We will do everything possible to try to avoid that. Local measures discussed. Await chiropractic intervention. Zanaflex 4 mg every 8 as needed. Add hydrocodone daily as needed. Encouraged work with chiropractor on appropriate exercises. If persists may need to see specialists. Once again reiterated to patient importance of trying to avoid long-term narcotics easily 25 minutes spent greater than 50% in discussion to this complicated presentation

## 2016-12-26 ENCOUNTER — Encounter: Payer: Self-pay | Admitting: Family Medicine

## 2017-05-29 ENCOUNTER — Ambulatory Visit (INDEPENDENT_AMBULATORY_CARE_PROVIDER_SITE_OTHER): Payer: 59 | Admitting: Advanced Practice Midwife

## 2017-05-29 ENCOUNTER — Encounter: Payer: Self-pay | Admitting: Advanced Practice Midwife

## 2017-05-29 VITALS — BP 146/92 | HR 73 | Wt 175.0 lb

## 2017-05-29 DIAGNOSIS — Z30430 Encounter for insertion of intrauterine contraceptive device: Secondary | ICD-10-CM

## 2017-05-29 DIAGNOSIS — Z3202 Encounter for pregnancy test, result negative: Secondary | ICD-10-CM | POA: Diagnosis not present

## 2017-05-29 LAB — POCT URINE PREGNANCY: Preg Test, Ur: NEGATIVE

## 2017-05-29 MED ORDER — LEVONORGESTREL 20 MCG/24HR IU IUD
INTRAUTERINE_SYSTEM | Freq: Once | INTRAUTERINE | Status: AC
Start: 2017-05-29 — End: 2017-05-29
  Administered 2017-05-29: 16:00:00 via INTRAUTERINE

## 2017-05-29 NOTE — Progress Notes (Signed)
Tina Weber is a 36 y.o. year old  female Gravida 1 Para 1  who presents for placement of a Mirena IUD. Her LMP was this week, hasn't had sex in 10 months (getting divorced) and her pregnancy test today is negative.    The risks and benefits of the method and placement have been thouroughly reviewed with the patient and all questions were answered.  Specifically the patient is aware of failure rate of 08/998, expulsion of the IUD and of possible perforation.  The patient is aware of irregular bleeding due to the method and understands the incidence of irregular bleeding diminishes with time.  Time out was performed.  A Graves speculum was placed.  The cervix was prepped using Betadine. The uterus was found to be retroflexed and it sounded to 8 cm.  The cervix was grasped with a tenaculum and the IUD was inserted to 8 cm.  It was pulled back 1 cm and the IUD was disengaged.  The strings were trimmed to 3 cm.  Sonogram was performed and the proper placement of the IUD was verified.  The patient was instructed on signs and symptoms of infection and to check for the strings after each menses or each month.  The patient is to refrain from intercourse for 3 days.  The patient is scheduled for a return appointment w/a pap/STD screen in 4 weeks.  CRESENZO-DISHMAN,Krisanne Lich 05/29/2017 2:55 PM

## 2017-05-29 NOTE — Addendum Note (Signed)
Addended by: Moss Mc on: 05/29/2017 04:31 PM   Modules accepted: Orders

## 2017-06-25 ENCOUNTER — Encounter: Payer: Self-pay | Admitting: Advanced Practice Midwife

## 2017-06-25 ENCOUNTER — Other Ambulatory Visit (HOSPITAL_COMMUNITY)
Admission: RE | Admit: 2017-06-25 | Discharge: 2017-06-25 | Disposition: A | Discharge status: Home / Self Care | Payer: 59 | Source: Ambulatory Visit | Attending: Advanced Practice Midwife | Admitting: Advanced Practice Midwife

## 2017-06-25 ENCOUNTER — Ambulatory Visit (INDEPENDENT_AMBULATORY_CARE_PROVIDER_SITE_OTHER): Payer: 59 | Admitting: Advanced Practice Midwife

## 2017-06-25 VITALS — BP 140/90 | HR 98 | Ht 64.0 in | Wt 178.0 lb

## 2017-06-25 DIAGNOSIS — I10 Essential (primary) hypertension: Secondary | ICD-10-CM

## 2017-06-25 DIAGNOSIS — Z01419 Encounter for gynecological examination (general) (routine) without abnormal findings: Secondary | ICD-10-CM

## 2017-06-25 DIAGNOSIS — R87611 Atypical squamous cells cannot exclude high grade squamous intraepithelial lesion on cytologic smear of cervix (ASC-H): Secondary | ICD-10-CM | POA: Insufficient documentation

## 2017-06-25 DIAGNOSIS — Z113 Encounter for screening for infections with a predominantly sexual mode of transmission: Secondary | ICD-10-CM | POA: Diagnosis not present

## 2017-06-26 LAB — HIV ANTIBODY (ROUTINE TESTING W REFLEX): HIV Screen 4th Generation wRfx: NONREACTIVE

## 2017-06-26 LAB — RPR: RPR Ser Ql: NONREACTIVE

## 2017-06-26 NOTE — Progress Notes (Signed)
Tina Weber 36 y.o.  Vitals:   06/25/17 1551  BP: 140/90  Pulse: 98     Filed Weights   06/25/17 1551  Weight: 80.7 kg (178 lb)    Past Medical History: Past Medical History:  Diagnosis Date  . Anxiety   . Depression   . Migraines   . Seizures (HCC)    Last seizure almost one year ago  . Supervision of normal pregnancy in first trimester 07/19/2014    [facility] Tina Weber 36 yo wm 1st Dating By LMP and US Pap  07/19/14 GC/CT Initial:                36+wks: Genetic Screen NT/IT:  CF screen  Anatomic US  Flu vaccine  Tdap Recommended ~ 28wks Glucose Screen  2 hr GBS  Feed Preference  Contraception  Circumcision  Childbirth Classes  Pediatrician      Past Surgical History: Past Surgical History:  Procedure Laterality Date  . CHOLECYSTECTOMY    . WISDOM TOOTH EXTRACTION      Family History: Family History  Problem Relation Age of Onset  . Congestive Heart Failure Maternal Grandmother   . Diabetes Maternal Grandfather   . Hypertension Father   . Anxiety disorder Father   . Cancer Mother        thyroid  . Anxiety disorder Mother     Social History: Social History   Tobacco Use  . Smoking status: Never Smoker  . Smokeless tobacco: Never Used  Substance Use Topics  . Alcohol use: No    Comment: 01-05-16 per pt no   . Drug use: No    Comment: 01-05-16 per pt no    Allergies: No Known Allergies   No current outpatient medications on file.  History of Present Illness: here for pap and physical. Last pap 12/15, was normal but had HRHPV.  Had Mirena placed a month ago, spots but otherwise no problems. Hasn't checked strings. Getting a divorce, no boyfriend and hasn't had sex.  States takes BP at home "fairly regularly" and it is NEVER elevated.   Review of Systems   Patient denies any headaches, blurred vision, shortness of breath, chest pain, abdominal pain, problems with bowel movements, urination, or intercourse.   Physical Exam: General:   Well developed, well nourished, no acute distress Skin:  Warm and dry Neck:  Midline trachea, normal thyroid Lungs; Clear to auscultation bilaterally Breast:  No dominant palpable mass, retraction, or nipple discharge Cardiovascular: Regular rate and rhythm Abdomen:  Soft, non tender, no hepatosplenomegaly Pelvic:  External genitalia is normal in appearance.  The vagina is normal in appearance.  The cervix is bulbous. IUD strings visible.  Bedside US shows fundal placement of IUD  Uterus is felt to be normal size, shape, and contour.  No adnexal masses or tenderness noted.  Extremities:  No swelling or varicosities noted Psych:  No mood changes.     Impression: Normal GYN exam, IUD check     Plan: If pap neg w/o HPV, pap q 3 years. If + HPV, will do colpo.

## 2017-07-01 LAB — CYTOLOGY - PAP
Chlamydia: NEGATIVE
HPV: DETECTED — AB
Neisseria Gonorrhea: NEGATIVE
TRICH (WINDOWPATH): NEGATIVE

## 2017-07-03 ENCOUNTER — Encounter: Payer: Self-pay | Admitting: Advanced Practice Midwife

## 2017-07-03 DIAGNOSIS — R87810 Cervical high risk human papillomavirus (HPV) DNA test positive: Secondary | ICD-10-CM

## 2017-07-03 DIAGNOSIS — R87610 Atypical squamous cells of undetermined significance on cytologic smear of cervix (ASC-US): Secondary | ICD-10-CM | POA: Insufficient documentation

## 2017-07-17 ENCOUNTER — Encounter: Payer: Self-pay | Admitting: Advanced Practice Midwife

## 2017-07-17 NOTE — Progress Notes (Signed)
Notified of unread email about abnormal pap.  Letter sent

## 2017-07-30 ENCOUNTER — Telehealth: Payer: Self-pay | Admitting: *Deleted

## 2017-07-30 NOTE — Telephone Encounter (Signed)
Called to check to see if patient received mychart message regarding abnormal pap. Patient states she did not get message. Informed pap showed ASCUS with positive HPV and needed a colpo. All questions answered. Verbalized understanding. Will schedule.

## 2017-08-18 ENCOUNTER — Encounter: Payer: 59 | Admitting: Obstetrics & Gynecology

## 2017-09-01 ENCOUNTER — Encounter: Payer: Self-pay | Admitting: Obstetrics & Gynecology

## 2017-09-01 ENCOUNTER — Ambulatory Visit: Payer: 59 | Admitting: Obstetrics & Gynecology

## 2017-09-01 VITALS — BP 132/84 | HR 85 | Ht 64.0 in | Wt 187.0 lb

## 2017-09-01 DIAGNOSIS — R87611 Atypical squamous cells cannot exclude high grade squamous intraepithelial lesion on cytologic smear of cervix (ASC-H): Secondary | ICD-10-CM

## 2017-09-01 DIAGNOSIS — Z3202 Encounter for pregnancy test, result negative: Secondary | ICD-10-CM

## 2017-09-01 LAB — POCT URINE PREGNANCY: PREG TEST UR: NEGATIVE

## 2017-09-01 NOTE — Addendum Note (Signed)
Addended by: Lazaro ArmsEURE, Rashon Westrup H on: 09/01/2017 04:25 PM   Modules accepted: Level of Service

## 2017-09-01 NOTE — Progress Notes (Signed)
Colposcopy Procedure Note:  Colposcopy Procedure Note  Indications: Pap smear 2 months ago showed: ASC cannot exclude high grade lesion [facility]). The prior pap showed no abnormalities.  Prior cervical/vaginal disease: n/a. Prior cervical treatment: no treatment.  Smoker:  No. New sexual partner:  No.    History of abnormal Pap: no + HPV but no cytological abnormality  Procedure Details  The risks and benefits of the procedure and Written informed consent obtained.  Speculum placed in vagina and excellent visualization of cervix achieved, cervix swabbed x 3 with acetic acid solution.  Findings:Adequate colposcopy Cervix: no visible lesions, no mosaicism, no punctation and no abnormal vasculature; SCJ visualized 360 degrees without lesions and no biopsies taken. Vaginal inspection: normal without visible lesions. Vulvar colposcopy: vulvar colposcopy not performed.  Specimens: none  Complications: none.  Plan: Repeat Pap 1 year

## 2018-05-04 ENCOUNTER — Ambulatory Visit (INDEPENDENT_AMBULATORY_CARE_PROVIDER_SITE_OTHER): Payer: BLUE CROSS/BLUE SHIELD | Admitting: Obstetrics & Gynecology

## 2018-05-04 ENCOUNTER — Encounter: Payer: Self-pay | Admitting: Obstetrics & Gynecology

## 2018-05-04 VITALS — BP 144/92 | HR 74 | Ht 64.0 in | Wt 169.0 lb

## 2018-05-04 DIAGNOSIS — Z7251 High risk heterosexual behavior: Secondary | ICD-10-CM | POA: Diagnosis not present

## 2018-05-04 DIAGNOSIS — Z113 Encounter for screening for infections with a predominantly sexual mode of transmission: Secondary | ICD-10-CM | POA: Diagnosis not present

## 2018-05-04 NOTE — Progress Notes (Signed)
Chief Complaint  Patient presents with  . Gynecologic Exam      37 y.o. G2P1011 No LMP recorded. (Menstrual status: IUD). The current method of family planning is IUD.  Outpatient Encounter Medications as of 05/04/2018  Medication Sig  . PARAGARD INTRAUTERINE COPPER IU by Intrauterine route.   No facility-administered encounter medications on file as of 05/04/2018.     Subjective Patient's sexual partner recently discovered unfaithful No symptoms Desires full STI eval Past Medical History:  Diagnosis Date  . Anxiety   . Depression   . Migraines   . Seizures (HCC)    Last seizure almost one year ago  . Supervision of normal pregnancy in first trimester 07/19/2014    [facility] 37 yo wm 1st Dating By LMP and [place] Pap  07/19/14 GC/CT Initial:                36+wks: Genetic Screen NT/IT:  CF screen  Anatomic [place]  Flu vaccine  Tdap Recommended ~ 28wks Glucose Screen  2 hr GBS  Feed Preference  Contraception  Circumcision  Childbirth Classes  Pediatrician      Past Surgical History:  Procedure Laterality Date  . CHOLECYSTECTOMY    . WISDOM TOOTH EXTRACTION      OB History    Gravida  2   Para  1   Term  1   Preterm      AB  1   Living  1     SAB  1   TAB      Ectopic      Multiple  0   Live Births  1           No Known Allergies  Social History   Socioeconomic History  . Marital status: Married    Spouse name: Not on file  . Number of children: Not on file  . Years of education: Not on file  . Highest education level: Not on file  Occupational History  . Not on file  Social Needs  . Financial resource strain: Not on file  . Food insecurity:    Worry: Not on file    Inability: Not on file  . Transportation needs:    Medical: Not on file    Non-medical: Not on file  Tobacco Use  . Smoking status: Never Smoker  . Smokeless tobacco: Never Used  Substance and Sexual Activity  . Alcohol use: No    Comment:  01-05-16 per pt no   . Drug use: No    Comment: 01-05-16 per pt no  . Sexual activity: Never    Birth control/protection: None  Lifestyle  . Physical activity:    Days per week: Not on file    Minutes per session: Not on file  . Stress: Not on file  Relationships  . Social connections:    Talks on phone: Not on file    Gets together: Not on file    Attends religious service: Not on file    Active member of club or organization: Not on file    Attends meetings of clubs or organizations: Not on file    Relationship status: Not on file  Other Topics Concern  . Not on file  Social History Narrative  . Not on file    Family History  Problem Relation Age of Onset  . Congestive Heart Failure Maternal Grandmother   . Diabetes Maternal Grandfather   . Hypertension  Father   . Anxiety disorder Father   . Cancer Mother        thyroid  . Anxiety disorder Mother     Medications:       Current Outpatient Medications:  .  PARAGARD INTRAUTERINE COPPER IU, by Intrauterine route., Disp: , Rfl:   Objective Blood pressure (!) 144/92, pulse 74, height 5\' 4"  (1.626 m), weight 169 lb (76.7 kg).  General WDWN female NAD Vulva:  normal appearing vulva with no masses, tenderness or lesions Vagina:  no lesions or discharge noted Cervix:  no cervical motion tenderness and no lesions Uterus:  normal size, contour, position, consistency, mobility, non-tender Adnexa: ovaries:present,  normal adnexa in size, nontender and no masses  Pertinent ROS No burning with urination, frequency or urgency No nausea, vomiting or diarrhea Nor fever chills or other constitutional symptoms   Labs or studies pending    Impression Diagnoses this Encounter::   ICD-10-CM   1. High risk heterosexual behavior Z72.51 HEP, RPR, HIV Panel  2. Screening examination for STD (sexually transmitted disease) Z11.3 NuSwab Vaginitis Plus (VG+)    CANCELED: GC/Chlamydia Probe Amp([facility])    Established relevant  diagnosis(es):   Plan/Recommendations: No orders of the defined types were placed in this encounter.   Labs or Scans Ordered: Orders Placed This Encounter  Procedures  . NuSwab Vaginitis Plus (VG+)  . HEP, RPR, HIV Panel    Management:: Will notify of results as they come in  Follow up Return if symptoms worsen or fail to improve.       All questions were answered.

## 2018-05-10 LAB — NUSWAB VAGINITIS PLUS (VG+)
CANDIDA GLABRATA, NAA: NEGATIVE
CHLAMYDIA TRACHOMATIS, NAA: NEGATIVE
Candida albicans, NAA: NEGATIVE
Neisseria gonorrhoeae, NAA: NEGATIVE
TRICH VAG BY NAA: NEGATIVE

## 2018-05-19 ENCOUNTER — Ambulatory Visit (INDEPENDENT_AMBULATORY_CARE_PROVIDER_SITE_OTHER): Payer: BLUE CROSS/BLUE SHIELD | Admitting: Psychiatry

## 2018-05-19 ENCOUNTER — Encounter (HOSPITAL_COMMUNITY): Payer: Self-pay | Admitting: Psychiatry

## 2018-05-19 VITALS — BP 170/100 | HR 100 | Ht 64.0 in | Wt 169.0 lb

## 2018-05-19 DIAGNOSIS — F331 Major depressive disorder, recurrent, moderate: Secondary | ICD-10-CM

## 2018-05-19 DIAGNOSIS — F419 Anxiety disorder, unspecified: Secondary | ICD-10-CM

## 2018-05-19 MED ORDER — CLONAZEPAM 0.5 MG PO TABS: 0.5000 mg | ORAL_TABLET | Freq: Every day | ORAL | 2 refills | Status: DC | PRN

## 2018-05-19 MED ORDER — BUPROPION HCL ER (XL) 150 MG PO TB24: 150.0000 mg | ORAL_TABLET | ORAL | 2 refills | Status: DC

## 2018-05-19 NOTE — Progress Notes (Signed)
BH MD/PA/NP OP Progress Note  05/19/2018 3:01 PM Tina Weber  MRN:  [id number]  Chief Complaint:  Chief Complaint    Depression; Anxiety; Follow-up     HPI: This patient is a 37 year old divorced white female who lives with her 43-year-old daughter and [name].  She is working as a Engineer, drilling for [employer].  The patient is a former patient here who was last seen 2 years ago.  At that time she was dealing with severe postpartum depression and anxiety.  She had been stabilized on a combination of Prozac, clonazepam and trazodone.  However she did not come back for follow-up.  The patient states that she stopped the medications on her own because she felt "too drugged."  She did well for a while but found out that her husband was cheating on her and they got divorced.  There are no good friends in the split the care of their daughter every week.  She states that about 8 months ago she met another man at a baseball game and they got involved in a relationship.  He is also divorced and has 2 children.  There is spending a lot of time together and getting quite serious.  However about 3 weeks ago another woman messaged her on Facebook and admitted that she and the patient's boyfriend had been having an affair on and off.  Apparently he has been with his other woman on and off since high school.  She confronted her boyfriend and he apologized and states that the other woman did not mean anything to him and he wanted to marry the patient.  The patient has been feeling overwhelmed and devastated.  She found out yesterday that the boyfriend had allowed the other woman to go into her home and now she feels violated.  She is very upset and is demeaning herself and blaming herself.  Obviously this is not her fault.  She has been crying nonstop.  She sleeps all the time and has no energy her mood is low and sad.  She is also very anxious.  She is not eating much and has lost about 20  pounds.  She does not feel like doing anything.  Her parents moved to [place] so she does not have a lot of support around her other than friends.  She denies any thoughts of suicide or self-harm.  She drinks occasional alcohol but is not using any drugs or cigarettes.  She has no psychotic symptoms.  She asked if she can go back on Wellbutrin which is what she took during her first postpartum episode and I think this would be reasonable.  She also wants something "for my nerves" because she does not want to keep crying at work.  She was going to a counselor in [place] before and she is going to seek this counselor out again. Visit Diagnosis:    ICD-10-CM   1. Moderate episode of recurrent major depressive disorder (HCC) F33.1     Past Psychiatric History: Minor depression in high school, postpartum depression 3 years ago after the birth of her daughter which responded fairly well to Prozac  Past Medical History:  Past Medical History:  Diagnosis Date  . Anxiety   . Depression   . Migraines   . Seizures ([place])    Last seizure almost one year ago  . Supervision of normal pregnancy in first trimester 07/19/2014    [place] 37 yo wm 1st  Dating By LMP and [place] Pap  07/19/14 GC/CT Initial:                36+wks: Genetic Screen NT/IT:  CF screen  Anatomic [place]  Flu vaccine  Tdap Recommended ~ 28wks Glucose Screen  2 hr GBS  Feed Preference  Contraception  Circumcision  Childbirth Classes  Pediatrician      Past Surgical History:  Procedure Laterality Date  . CHOLECYSTECTOMY    . WISDOM TOOTH EXTRACTION      Family Psychiatric History: See below  Family History:  Family History  Problem Relation Age of Onset  . Congestive Heart Failure Maternal Grandmother   . Diabetes Maternal Grandfather   . Hypertension Father   . Anxiety disorder Father   . Cancer Mother        thyroid  . Anxiety disorder Mother     Social History:  Social History   Socioeconomic History   . Marital status: Married    Spouse name: Not on file  . Number of children: Not on file  . Years of education: Not on file  . Highest education level: Not on file  Occupational History  . Not on file  Social Needs  . Financial resource strain: Not on file  . Food insecurity:    Worry: Not on file    Inability: Not on file  . Transportation needs:    Medical: Not on file    Non-medical: Not on file  Tobacco Use  . Smoking status: Never Smoker  . Smokeless tobacco: Never Used  Substance and Sexual Activity  . Alcohol use: No    Comment: 01-05-16 per pt no   . Drug use: No    Comment: 01-05-16 per pt no  . Sexual activity: Never    Birth control/protection: None  Lifestyle  . Physical activity:    Days per week: Not on file    Minutes per session: Not on file  . Stress: Not on file  Relationships  . Social connections:    Talks on phone: Not on file    Gets together: Not on file    Attends religious service: Not on file    Active member of club or organization: Not on file    Attends meetings of clubs or organizations: Not on file    Relationship status: Not on file  Other Topics Concern  . Not on file  Social History Narrative  . Not on file    Allergies: No Known Allergies  Metabolic Disorder Labs: No results found for: HGBA1C, MPG No results found for: PROLACTIN No results found for: CHOL, TRIG, HDL, CHOLHDL, VLDL, LDLCALC Lab Results  Component Value Date   TSH 0.674 07/19/2014    Therapeutic Level Labs: No results found for: LITHIUM No results found for: VALPROATE No components found for:  CBMZ  Current Medications: Current Outpatient Medications  Medication Sig Dispense Refill  . buPROPion (WELLBUTRIN XL) 150 MG 24 hr tablet Take 1 tablet (150 mg total) by mouth every morning. 30 tablet 2  . clonazePAM (KLONOPIN) 0.5 MG tablet Take 1 tablet (0.5 mg total) by mouth daily as needed for anxiety. 30 tablet 2  . PARAGARD INTRAUTERINE COPPER IU by  Intrauterine route.     No current facility-administered medications for this visit.      Musculoskeletal: Strength & Muscle Tone: within normal limits Gait & Station: normal Patient leans: N/A  Psychiatric Specialty Exam: Review of Systems  Psychiatric/Behavioral: Positive for depression. The patient  is nervous/anxious.   All other systems reviewed and are negative.   Blood pressure (!) 170/100, pulse 100, height '5\' 4"'  (1.626 m), weight 169 lb (76.7 kg), SpO2 100 %.Body mass index is 29.01 kg/m.  General Appearance: Casual, Neat and Well Groomed  Eye Contact:  Good  Speech:  Clear and Coherent  Volume:  Normal  Mood:  Anxious and Depressed  Affect:  Depressed and Tearful  Thought Process:  Goal Directed  Orientation:  Full (Time, Place, and Person)  Thought Content: Rumination   Suicidal Thoughts:  No  Homicidal Thoughts:  No  Memory:  Immediate;   Good Recent;   Good Remote;   Good  Judgement:  Fair  Insight:  Good  Psychomotor Activity:  Decreased  Concentration:  Concentration: Poor and Attention Span: Poor  Recall:  Good  Fund of Knowledge: Good  Language: Good  Akathisia:  No  Handed:  Right  AIMS (if indicated): not done  Assets:  Communication Skills Desire for Improvement Physical Health Resilience Social Support Talents/Skills  ADL's:  Intact  Cognition: WNL  Sleep:  Good   Screenings:   Assessment and Plan: This patient is a 37 year old female with a history of postpartum depression and anxiety.  Her symptoms have recurred now that she has felt betrayed by her current boyfriend.  She is also doing a lot of self blaming which I told her needs to stop.  She is going to seek out counseling.  She will restart Wellbutrin XL 150 mg daily for depression and clonazepam 0.5 mg daily as needed for anxiety.  She will return to see me in 4 weeks.   Levonne Spiller, MD 05/19/2018, 3:01 PM

## 2018-06-24 ENCOUNTER — Ambulatory Visit (INDEPENDENT_AMBULATORY_CARE_PROVIDER_SITE_OTHER): Payer: BLUE CROSS/BLUE SHIELD | Admitting: Psychiatry

## 2018-06-24 ENCOUNTER — Encounter (HOSPITAL_COMMUNITY): Payer: Self-pay | Admitting: Psychiatry

## 2018-06-24 VITALS — BP 152/96 | HR 87 | Ht 64.0 in | Wt 173.0 lb

## 2018-06-24 DIAGNOSIS — F331 Major depressive disorder, recurrent, moderate: Secondary | ICD-10-CM

## 2018-06-24 MED ORDER — CLONAZEPAM 0.5 MG PO TABS: 0.5000 mg | ORAL_TABLET | Freq: Two times a day (BID) | ORAL | 2 refills | Status: DC | PRN

## 2018-06-24 MED ORDER — BUPROPION HCL ER (XL) 150 MG PO TB24: 150.0000 mg | ORAL_TABLET | ORAL | 2 refills | Status: DC

## 2018-06-24 NOTE — Progress Notes (Signed)
[place] MD/PA/NP OP Progress Note  06/24/2018 4:16 PM Tina Weber  MRN:  [id number]  Chief Complaint:  Chief Complaint    Depression; Anxiety; Follow-up     HPI: This patient is a 37 year old divorced white female who lives with her 54-year-old daughter and [name].  She is working as a Engineer, drilling for [employer].  The patient is a former patient here who was last seen 2 years ago.  At that time she was dealing with severe postpartum depression and anxiety.  She had been stabilized on a combination of Prozac, clonazepam and trazodone.  However she did not come back for follow-up.  The patient states that she stopped the medications on her own because she felt "too drugged."  She did well for a while but found out that her husband was cheating on her and they got divorced.  There are no good friends in the split the care of their daughter every week.  She states that about 8 months ago she met another man at a baseball game and they got involved in a relationship.  He is also divorced and has 2 children.  There is spending a lot of time together and getting quite serious.  However about 3 weeks ago another woman messaged her on Facebook and admitted that she and the patient's boyfriend had been having an affair on and off.  Apparently he has been with his other woman on and off since high school.  She confronted her boyfriend and he apologized and states that the other woman did not mean anything to him and he wanted to marry the patient.  The patient has been feeling overwhelmed and devastated.  She found out yesterday that the boyfriend had allowed the other woman to go into her home and now she feels violated.  She is very upset and is demeaning herself and blaming herself.  Obviously this is not her fault.  She has been crying nonstop.  She sleeps all the time and has no energy her mood is low and sad.  She is also very anxious.  She is not eating much and has lost about  20 pounds.  She does not feel like doing anything.  Her parents moved to [place] so she does not have a lot of support around her other than friends.  She denies any thoughts of suicide or self-harm.  She drinks occasional alcohol but is not using any drugs or cigarettes.  She has no psychotic symptoms.  She asked if she can go back on Wellbutrin which is what she took during her first postpartum episode and I think this would be reasonable.  She also wants something "for my nerves" because she does not want to keep crying at work.  She was going to a counselor in [place] before and she is going to seek this counselor out again.  The patient returns after 4 weeks.  Now she is having even more stress.  She found out that her ex-husband who has her daughter every other week has been bringing strippers into the home and having sex with them even while the little girl is at the home.  He made threats to the stripper and to the patient stating that he would harm them if they try to take the child away from him.  Patient states that he is "obsessed" with a 70-year-old daughter.  It does not sound like this environment is safe for the child and I suggested that she  consult an attorney and try to get his visitation terminated and also make a report to the police and DSS child protection.  She claims that she will do these things.  She states that the Wellbutrin has helped her depression but she is extremely anxious and asked if we can increase the clonazepam to twice a day and I think this is a good idea. Visit Diagnosis:    ICD-10-CM   1. Moderate episode of recurrent major depressive disorder (HCC) F33.1 clonazePAM (KLONOPIN) 0.5 MG tablet    Past Psychiatric History: Minor depression in high school, postpartum depression 3 years ago after the birth of her daughter which responded fairly well to Prozac  Past Medical History:  Past Medical History:  Diagnosis Date  . Anxiety   . Depression   . Migraines    . Seizures ([place])    Last seizure almost one year ago  . Supervision of normal pregnancy in first trimester 07/19/2014    [name] 37 yo wm 1st Dating By LMP and [place] Pap  07/19/14 GC/CT Initial:                36+wks: Genetic Screen NT/IT:  CF screen  Anatomic [place]  Flu vaccine  Tdap Recommended ~ 28wks Glucose Screen  2 hr GBS  Feed Preference  Contraception  Circumcision  Childbirth Classes  Pediatrician      Past Surgical History:  Procedure Laterality Date  . CHOLECYSTECTOMY    . WISDOM TOOTH EXTRACTION      Family Psychiatric History: See below  Family History:  Family History  Problem Relation Age of Onset  . Congestive Heart Failure Maternal Grandmother   . Diabetes Maternal Grandfather   . Hypertension Father   . Anxiety disorder Father   . Cancer Mother        thyroid  . Anxiety disorder Mother     Social History:  Social History   Socioeconomic History  . Marital status: Married    Spouse name: Not on file  . Number of children: Not on file  . Years of education: Not on file  . Highest education level: Not on file  Occupational History  . Not on file  Social Needs  . Financial resource strain: Not on file  . Food insecurity:    Worry: Not on file    Inability: Not on file  . Transportation needs:    Medical: Not on file    Non-medical: Not on file  Tobacco Use  . Smoking status: Never Smoker  . Smokeless tobacco: Never Used  Substance and Sexual Activity  . Alcohol use: No    Comment: 01-05-16 per pt no   . Drug use: No    Comment: 01-05-16 per pt no  . Sexual activity: Never    Birth control/protection: None  Lifestyle  . Physical activity:    Days per week: Not on file    Minutes per session: Not on file  . Stress: Not on file  Relationships  . Social connections:    Talks on phone: Not on file    Gets together: Not on file    Attends religious service: Not on file    Active member of club or organization: Not on file     Attends meetings of clubs or organizations: Not on file    Relationship status: Not on file  Other Topics Concern  . Not on file  Social History Narrative  . Not on file  Allergies: No Known Allergies  Metabolic Disorder Labs: No results found for: HGBA1C, MPG No results found for: PROLACTIN No results found for: CHOL, TRIG, HDL, CHOLHDL, VLDL, LDLCALC Lab Results  Component Value Date   TSH 0.674 07/19/2014    Therapeutic Level Labs: No results found for: LITHIUM No results found for: VALPROATE No components found for:  CBMZ  Current Medications: Current Outpatient Medications  Medication Sig Dispense Refill  . buPROPion (WELLBUTRIN XL) 150 MG 24 hr tablet Take 1 tablet (150 mg total) by mouth every morning. 30 tablet 2  . clonazePAM (KLONOPIN) 0.5 MG tablet Take 1 tablet (0.5 mg total) by mouth 2 (two) times daily as needed for anxiety. 60 tablet 2  . PARAGARD INTRAUTERINE COPPER IU by Intrauterine route.     No current facility-administered medications for this visit.      Musculoskeletal: Strength & Muscle Tone: within normal limits Gait & Station: normal Patient leans: N/A  Psychiatric Specialty Exam: Review of Systems  Psychiatric/Behavioral: The patient is nervous/anxious.   All other systems reviewed and are negative.   Blood pressure (!) 152/96, pulse 87, height _0  (1.626 m), weight 173 lb (78.5 kg), SpO2 100 %.Body mass index is 29.7 kg/m.  General Appearance: Casual and Fairly Groomed  Eye Contact:  Good  Speech:  Clear and Coherent  Volume:  Normal  Mood:  Anxious  Affect:  Constricted and Tearful  Thought Process:  Goal Directed  Orientation:  Full (Time, Place, and Person)  Thought Content: Rumination   Suicidal Thoughts:  No  Homicidal Thoughts:  No  Memory:  Immediate;   Good Recent;   Good Remote;   Good  Judgement:  Good  Insight:  Good  Psychomotor Activity:  Normal  Concentration:  Concentration: Good and Attention Span:  Good  Recall:  Good  Fund of Knowledge: Good  Language: Good  Akathisia:  No  Handed:  Right  AIMS (if indicated): not done  Assets:  Communication Skills Desire for Improvement Physical Health Resilience Social Support Talents/Skills  ADL's:  Intact  Cognition: WNL  Sleep:  Fair   Screenings:   Assessment and Plan: This patient is a 37 year old female with a history of depression and anxiety.  She is very stressed given the recent turn of events regarding her ex-husband and her daughter.  She will continue Wellbutrin XL 150 mg daily for depression.  Clonazepam will be increased to 0.5 mg twice daily as needed for anxiety.  She will return to see me in 4 weeks   Levonne Spiller, MD 06/24/2018, 4:16 PM

## 2018-07-27 ENCOUNTER — Ambulatory Visit (HOSPITAL_COMMUNITY): Payer: BLUE CROSS/BLUE SHIELD | Admitting: Psychiatry

## 2018-07-27 ENCOUNTER — Encounter (HOSPITAL_COMMUNITY): Payer: Self-pay | Admitting: Psychiatry

## 2018-07-27 DIAGNOSIS — F331 Major depressive disorder, recurrent, moderate: Secondary | ICD-10-CM

## 2018-07-27 MED ORDER — BUPROPION HCL ER (XL) 150 MG PO TB24: 150.0000 mg | ORAL_TABLET | ORAL | 2 refills | Status: DC

## 2018-07-27 MED ORDER — CLONAZEPAM 0.5 MG PO TABS: ORAL_TABLET | ORAL | 2 refills | Status: DC

## 2018-07-27 NOTE — Progress Notes (Signed)
[place] MD/PA/NP OP Progress Note  07/27/2018 4:36 PM Tina Weber  MRN:  [id number]  Chief Complaint:  Chief Complaint    Depression; Anxiety; Follow-up     HPI: This patient is a 37 year old divorced white female who lives with her 60-year-old daughter and [name]. She is working as a Engineer, drilling for [employer].  The patient is a former patient here who was last seen 2 years ago. At that time she was dealing with severe postpartum depression and anxiety. She had been stabilized on a combination of Prozac, clonazepam and trazodone. However she did not come back for follow-up.  The patient states that she stopped the medications on her own because she felt "too drugged." She did well for a while but found out that her husband was cheating on her and they got divorced. There are no good friends in the split the care of their daughter every week. She states that about 8 months ago she met another man at a baseball game and they got involved in a relationship. He is also divorced and has 2 children. There is spending a lot of time together and getting quite serious. However about 3 weeks ago another woman messaged her on Facebook and admitted that she and the patient's boyfriend had been having an affair on and off. Apparently he has been with his other woman on and off since high school. She confronted her boyfriend and he apologized and states that the other woman did not mean anything to him and he wanted to marry the patient.  The patient has been feeling overwhelmed and devastated. She found out yesterday that the boyfriend had allowed the other woman to go into her home and now she feels violated. She is very upset and is demeaning herself and blaming herself. Obviously this is not her fault. She has been crying nonstop. She sleeps all the time and has no energy her mood is low and sad. She is also very anxious. She is not eating much and has lost about  20 pounds. She does not feel like doing anything. Her parents moved to [place] so she does not have a lot of support around her other than friends. She denies any thoughts of suicide or self-harm. She drinks occasional alcohol but is not using any drugs or cigarettes. She has no psychotic symptoms.  She asked if she can go back on Wellbutrin which is what she took during her first postpartum episode and I think this would be reasonable. She also wants something "for my nerves" because she does not want to keep crying at work. She was going to a counselor in [place] before and she is going to seek this counselor out again.  The patient returns after 1 month.  She did get temporary full custody of her daughter because her husband brought a stripper and wants and also was going on sex sites online while the daughter was visiting him.  Now however she is worried that he will come kidnap her take her even though he has not made any threats like this.  He does constantly text her and tell her that she is a bad mother and he tends to be very manipulative.  Consequently she is not sleeping well.  She does sleep better when she takes 2 of the clonazepam which is a total of 1 mg.  I explained that I can give her a little bit more but I do not want to go too high  because the addiction potential.  The Wellbutrin still seems to be helping her depression.  She is able to function at work. Visit Diagnosis:    ICD-10-CM   1. Moderate episode of recurrent major depressive disorder (HCC) F33.1 clonazePAM (KLONOPIN) 0.5 MG tablet    Past Psychiatric History: Some depression in high school, postpartum depression 3 years ago after the birth of her daughter which responded fairly well to Prozac  Past Medical History:  Past Medical History:  Diagnosis Date  . Anxiety   . Depression   . Migraines   . Seizures ([place])    Last seizure almost one year ago  . Supervision of normal pregnancy in first trimester  07/19/2014    [place] 37 yo wm 1st Dating By LMP and [place] Pap  07/19/14 GC/CT Initial:                36+wks: Genetic Screen NT/IT:  CF screen  Anatomic [place]  Flu vaccine  Tdap Recommended ~ 28wks Glucose Screen  2 hr GBS  Feed Preference  Contraception  Circumcision  Childbirth Classes  Pediatrician      Past Surgical History:  Procedure Laterality Date  . CHOLECYSTECTOMY    . WISDOM TOOTH EXTRACTION      Family Psychiatric History: See below  Family History:  Family History  Problem Relation Age of Onset  . Congestive Heart Failure Maternal Grandmother   . Diabetes Maternal Grandfather   . Hypertension Father   . Anxiety disorder Father   . Cancer Mother        thyroid  . Anxiety disorder Mother     Social History:  Social History   Socioeconomic History  . Marital status: Married    Spouse name: Not on file  . Number of children: Not on file  . Years of education: Not on file  . Highest education level: Not on file  Occupational History  . Not on file  Social Needs  . Financial resource strain: Not on file  . Food insecurity:    Worry: Not on file    Inability: Not on file  . Transportation needs:    Medical: Not on file    Non-medical: Not on file  Tobacco Use  . Smoking status: Never Smoker  . Smokeless tobacco: Never Used  Substance and Sexual Activity  . Alcohol use: No    Comment: 01-05-16 per pt no   . Drug use: No    Comment: 01-05-16 per pt no  . Sexual activity: Never    Birth control/protection: None  Lifestyle  . Physical activity:    Days per week: Not on file    Minutes per session: Not on file  . Stress: Not on file  Relationships  . Social connections:    Talks on phone: Not on file    Gets together: Not on file    Attends religious service: Not on file    Active member of club or organization: Not on file    Attends meetings of clubs or organizations: Not on file    Relationship status: Not on file  Other Topics  Concern  . Not on file  Social History Narrative  . Not on file    Allergies: No Known Allergies  Metabolic Disorder Labs: No results found for: HGBA1C, MPG No results found for: PROLACTIN No results found for: CHOL, TRIG, HDL, CHOLHDL, VLDL, LDLCALC Lab Results  Component Value Date   TSH 0.674 07/19/2014  Therapeutic Level Labs: No results found for: LITHIUM No results found for: VALPROATE No components found for:  CBMZ  Current Medications: Current Outpatient Medications  Medication Sig Dispense Refill  . buPROPion (WELLBUTRIN XL) 150 MG 24 hr tablet Take 1 tablet (150 mg total) by mouth every morning. 30 tablet 2  . clonazePAM (KLONOPIN) 0.5 MG tablet Take one twice a day and two at night 120 tablet 2  . PARAGARD INTRAUTERINE COPPER IU by Intrauterine route.     No current facility-administered medications for this visit.      Musculoskeletal: Strength & Muscle Tone: within normal limits Gait & Station: normal Patient leans: N/A  Psychiatric Specialty Exam: Review of Systems  Psychiatric/Behavioral: The patient is nervous/anxious and has insomnia.   All other systems reviewed and are negative.   Blood pressure (!) 152/96, pulse 90, height '5\' 4"'  (1.626 m), weight 171 lb 11.2 oz (77.9 kg), SpO2 100 %.Body mass index is 29.47 kg/m.  General Appearance: Casual and Fairly Groomed  Eye Contact:  Good  Speech:  Clear and Coherent  Volume:  Normal  Mood:  Anxious  Affect:  Congruent  Thought Process:  Goal Directed  Orientation:  Full (Time, Place, and Person)  Thought Content: Rumination   Suicidal Thoughts:  No  Homicidal Thoughts:  No  Memory:  Immediate;   Good Recent;   Good Remote;   Good  Judgement:  Good  Insight:  Good  Psychomotor Activity:  Decreased  Concentration:  Concentration: Fair and Attention Span: Fair  Recall:  Good  Fund of Knowledge: Good  Language: Good  Akathisia:  No  Handed:  Right  AIMS (if indicated): not done  Assets:   Communication Skills Desire for Improvement Physical Health Resilience Social Support Talents/Skills  ADL's:  Intact  Cognition: WNL  Sleep:  Good   Screenings:   Assessment and Plan: This patient is a 37 year old female with a history of depression and anxiety.  She is still very anxious regarding the status of her daughter.  We will increase clonazepam to 0.5 mg twice daily and 1 mg at bedtime. She will continue Wellbutrin XL 150 mg daily for depression.  She will return to see me in 6 weeks   Levonne Spiller, MD 07/27/2018, 4:36 PM

## 2018-07-28 ENCOUNTER — Ambulatory Visit (HOSPITAL_COMMUNITY): Payer: BLUE CROSS/BLUE SHIELD | Admitting: Psychiatry

## 2018-08-14 ENCOUNTER — Other Ambulatory Visit (HOSPITAL_COMMUNITY): Payer: Self-pay | Admitting: Psychiatry

## 2018-08-25 ENCOUNTER — Ambulatory Visit (HOSPITAL_COMMUNITY): Payer: BLUE CROSS/BLUE SHIELD | Admitting: Psychiatry

## 2018-09-08 ENCOUNTER — Encounter (HOSPITAL_COMMUNITY): Payer: Self-pay | Admitting: Psychiatry

## 2018-09-08 ENCOUNTER — Ambulatory Visit (HOSPITAL_COMMUNITY): Payer: BLUE CROSS/BLUE SHIELD | Admitting: Psychiatry

## 2018-09-08 DIAGNOSIS — F331 Major depressive disorder, recurrent, moderate: Secondary | ICD-10-CM

## 2018-09-08 MED ORDER — TRAZODONE HCL 50 MG PO TABS: 50.0000 mg | ORAL_TABLET | Freq: Every day | ORAL | 2 refills | Status: DC

## 2018-09-08 MED ORDER — BUPROPION HCL ER (XL) 300 MG PO TB24: 300.0000 mg | ORAL_TABLET | ORAL | 2 refills | Status: DC

## 2018-09-08 MED ORDER — CLONAZEPAM 0.5 MG PO TABS: ORAL_TABLET | ORAL | 2 refills | Status: DC

## 2018-09-08 NOTE — Progress Notes (Signed)
[place] MD/PA/NP OP Progress Note  09/08/2018 4:09 PM Tina Weber  MRN:  [id number]  Chief Complaint:  Chief Complaint    Depression; Anxiety; Follow-up     HPI: This patient is a 38 year old divorced white female who lives with her 66-year-old daughter and Tina Weber. She is working as a Engineer, drilling for [employer].  The patient is a former patient here who was last seen 2 years ago. At that time she was dealing with severe postpartum depression and anxiety. She had been stabilized on a combination of Prozac, clonazepam and trazodone. However she did not come back for follow-up.  The patient states that she stopped the medications on her own because she felt "too drugged." She did well for a while but found out that her husband was cheating on her and they got divorced. There are no good friends in the split the care of their daughter every week. She states that about 8 months ago she met another man at a baseball game and they got involved in a relationship. He is also divorced and has 2 children. There is spending a lot of time together and getting quite serious. However about 3 weeks ago another woman messaged her on Facebook and admitted that she and the patient's boyfriend had been having an affair on and off. Apparently he has been with his other woman on and off since high school. She confronted her boyfriend and he apologized and states that the other woman did not mean anything to him and he wanted to marry the patient.  The patient has been feeling overwhelmed and devastated. She found out yesterday that the boyfriend had allowed the other woman to go into her home and now she feels violated. She is very upset and is demeaning herself and blaming herself. Obviously this is not her fault. She has been crying nonstop. She sleeps all the time and has no energy her mood is low and sad. She is also very anxious. She is not eating much and has lost about  20 pounds. She does not feel like doing anything. Her parents moved to [place] so she does not have a lot of support around her other than friends. She denies any thoughts of suicide or self-harm. She drinks occasional alcohol but is not using any drugs or cigarettes. She has no psychotic symptoms.  She asked if she can go back on Wellbutrin which is what she took during her first postpartum episode and I think this would be reasonable. She also wants something "for my nerves" because she does not want to keep crying at work. She was going to a counselor in [place] before and she is going to seek this counselor out again.  The patient returns after 6 weeks.  She is still going through a very rough time.  Her ex-husband is still texting her all the time , trying to get to talk to the daughter get more time with the daughter.  She states that he never lets up.  He can also be verbally abusive.  I told her to try to limit this contact and perhaps get another phone.  She is not sleeping well.  She is having waves of depression unexpectedly and at work and crying spells a take over.  I suggested we increase her Wellbutrin and also add trazodone so she can sleep as she is not able to sleep well right now.  Her ex-husband is still able to see the child about 3  hours a week on Fridays and she states while the child is gone she has overwhelming anxiety.  She is working with an Forensic psychologist regarding custody.  She will be starting therapy here with Tina Weber next month Visit Diagnosis:    ICD-10-CM   1. Moderate episode of recurrent major depressive disorder (HCC) F33.1 clonazePAM (KLONOPIN) 0.5 MG tablet    Past Psychiatric History: Depression in high school, postpartum depression 3 years ago after the birth of her daughter which responded fairly well to Prozac  Past Medical History:  Past Medical History:  Diagnosis Date  . Anxiety   . Depression   . Migraines   . Seizures (Tina Weber)    Last seizure  almost one year ago  . Supervision of normal pregnancy in first trimester 07/19/2014    [place] 38 yo wm 1st Dating By LMP and [place] Pap  07/19/14 GC/CT Initial:                36+wks: Genetic Screen NT/IT:  CF screen  Anatomic [place]  Flu vaccine  Tdap Recommended ~ 28wks Glucose Screen  2 hr GBS  Feed Preference  Contraception  Circumcision  Childbirth Classes  Pediatrician      Past Surgical History:  Procedure Laterality Date  . CHOLECYSTECTOMY    . WISDOM TOOTH EXTRACTION      Family Psychiatric History: See below  Family History:  Family History  Problem Relation Age of Onset  . Congestive Heart Failure Maternal Grandmother   . Diabetes Maternal Grandfather   . Hypertension Father   . Anxiety disorder Father   . Cancer Mother        thyroid  . Anxiety disorder Mother     Social History:  Social History   Socioeconomic History  . Marital status: Married    Spouse name: Not on file  . Number of children: Not on file  . Years of education: Not on file  . Highest education level: Not on file  Occupational History  . Not on file  Social Needs  . Financial resource strain: Not on file  . Food insecurity:    Worry: Not on file    Inability: Not on file  . Transportation needs:    Medical: Not on file    Non-medical: Not on file  Tobacco Use  . Smoking status: Never Smoker  . Smokeless tobacco: Never Used  Substance and Sexual Activity  . Alcohol use: No    Comment: 01-05-16 per pt no   . Drug use: No    Comment: 01-05-16 per pt no  . Sexual activity: Never    Birth control/protection: None  Lifestyle  . Physical activity:    Days per week: Not on file    Minutes per session: Not on file  . Stress: Not on file  Relationships  . Social connections:    Talks on phone: Not on file    Gets together: Not on file    Attends religious service: Not on file    Active member of club or organization: Not on file    Attends meetings of clubs or  organizations: Not on file    Relationship status: Not on file  Other Topics Concern  . Not on file  Social History Narrative  . Not on file    Allergies: No Known Allergies  Metabolic Disorder Labs: No results found for: HGBA1C, MPG No results found for: PROLACTIN No results found for: CHOL, TRIG, HDL,  CHOLHDL, VLDL, LDLCALC Lab Results  Component Value Date   TSH 0.674 07/19/2014    Therapeutic Level Labs: No results found for: LITHIUM No results found for: VALPROATE No components found for:  CBMZ  Current Medications: Current Outpatient Medications  Medication Sig Dispense Refill  . clonazePAM (KLONOPIN) 0.5 MG tablet Take one twice a day and two at night 120 tablet 2  . PARAGARD INTRAUTERINE COPPER IU by Intrauterine route.    Marland Kitchen buPROPion (WELLBUTRIN XL) 300 MG 24 hr tablet Take 1 tablet (300 mg total) by mouth every morning. 30 tablet 2  . traZODone (DESYREL) 50 MG tablet Take 1 tablet (50 mg total) by mouth at bedtime. 30 tablet 2   No current facility-administered medications for this visit.      Musculoskeletal: Strength & Muscle Tone: within normal limits Gait & Station: normal Patient leans: N/A  Psychiatric Specialty Exam: Review of Systems  Psychiatric/Behavioral: Positive for depression. The patient is nervous/anxious and has insomnia.   All other systems reviewed and are negative.   Blood pressure 137/84, pulse 80, height '5\' 4"'  (1.626 m), weight 178 lb (80.7 kg), SpO2 97 %.Body mass index is 30.55 kg/m.  General Appearance: Casual and Fairly Groomed  Eye Contact:  Good  Speech:  Clear and Coherent  Volume:  Normal  Mood:  Anxious and Depressed  Affect:  Congruent and Tearful  Thought Process:  Goal Directed  Orientation:  Full (Time, Place, and Person)  Thought Content: Rumination   Suicidal Thoughts:  No  Homicidal Thoughts:  No  Memory:  Immediate;   Good Recent;   Good Remote;   Good  Judgement:  Good  Insight:  Good  Psychomotor  Activity:  Normal  Concentration:  Concentration: Fair and Attention Span: Fair  Recall:  Good  Fund of Knowledge: Good  Language: Good  Akathisia:  No  Handed:  Right  AIMS (if indicated): not done  Assets:  Communication Skills Desire for Improvement Physical Health Resilience Social Support Talents/Skills  ADL's:  Intact  Cognition: WNL  Sleep:  Poor   Screenings:   Assessment and Plan: This patient is a 38 year old female with a history of depression.  She has become increasingly depressed and anxious while she is going through this custody issue with her ex-husband.  We will increase Wellbutrin XL to 300 mg daily for depression continue clonazepam 0.5 mg twice daily and 1 mg at bedtime for anxiety and add trazodone 50 mg at bedtime for sleep.  She will return to see me in 6 weeks   Levonne Spiller, MD 09/08/2018, 4:09 PM

## 2018-09-30 ENCOUNTER — Ambulatory Visit (HOSPITAL_COMMUNITY): Payer: BLUE CROSS/BLUE SHIELD | Admitting: Psychiatry

## 2018-10-27 ENCOUNTER — Ambulatory Visit (HOSPITAL_COMMUNITY): Payer: BLUE CROSS/BLUE SHIELD | Admitting: Psychiatry

## 2018-10-28 ENCOUNTER — Ambulatory Visit (HOSPITAL_COMMUNITY): Payer: BLUE CROSS/BLUE SHIELD | Admitting: Psychiatry

## 2018-11-12 ENCOUNTER — Encounter (HOSPITAL_COMMUNITY): Payer: Self-pay | Admitting: Psychiatry

## 2018-11-12 ENCOUNTER — Ambulatory Visit (INDEPENDENT_AMBULATORY_CARE_PROVIDER_SITE_OTHER): Payer: BLUE CROSS/BLUE SHIELD | Admitting: Psychiatry

## 2018-11-12 ENCOUNTER — Other Ambulatory Visit: Payer: Self-pay

## 2018-11-12 DIAGNOSIS — F331 Major depressive disorder, recurrent, moderate: Secondary | ICD-10-CM

## 2018-11-12 DIAGNOSIS — Z639 Problem related to primary support group, unspecified: Secondary | ICD-10-CM | POA: Diagnosis not present

## 2018-11-12 DIAGNOSIS — Z79899 Other long term (current) drug therapy: Secondary | ICD-10-CM | POA: Diagnosis not present

## 2018-11-12 DIAGNOSIS — Z635 Disruption of family by separation and divorce: Secondary | ICD-10-CM | POA: Diagnosis not present

## 2018-11-12 MED ORDER — TRAZODONE HCL 100 MG PO TABS: 100.0000 mg | ORAL_TABLET | Freq: Every day | ORAL | 2 refills | Status: DC

## 2018-11-12 MED ORDER — BUPROPION HCL ER (XL) 300 MG PO TB24: 300.0000 mg | ORAL_TABLET | ORAL | 2 refills | Status: DC

## 2018-11-12 MED ORDER — CLONAZEPAM 0.5 MG PO TABS: ORAL_TABLET | ORAL | 2 refills | Status: DC

## 2018-11-12 NOTE — Progress Notes (Signed)
Virtual Visit via Telephone Note  I connected with Tina Weber on 11/12/18 at  3:40 PM EDT by telephone and verified that I am speaking with the correct person using two identifiers.   I discussed the limitations, risks, security and privacy concerns of performing an evaluation and management service by telephone and the availability of in person appointments. I also discussed with the patient that there may be a patient responsible charge related to this service. The patient expressed understanding and agreed to proceed.    I discussed the assessment and treatment plan with the patient. The patient was provided an opportunity to ask questions and all were answered. The patient agreed with the plan and demonstrated an understanding of the instructions.   The patient was advised to call back or seek an in-person evaluation if the symptoms worsen or if the condition fails to improve as anticipated.  I provided 15 minutes of non-face-to-face time during this encounter.    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  The patient is a former patient here who was last seen 2 years ago. At that time she was dealing with severe postpartum depression and anxiety. She had been stabilized on a combination of Prozac, clonazepam and trazodone. However she did not come back for follow-up.  The patient states that she stopped the medications on her own because she felt "too drugged." She did well for a while but found out that her husband was cheating on her and they got divorced. There are no good friends in the split the care of their daughter every week. She states that about 8  months ago she met another man at a baseball game and they got involved in a relationship. He is also divorced and has 2 children. There is spending a lot of time together and getting quite serious. However about 3 weeks ago another woman messaged her on Facebook and admitted that she and the patient's boyfriend had been having an affair on and off. Apparently he has been with his other woman on and off since high school. She confronted her boyfriend and he apologized and states that the other woman did not mean anything to him and he wanted to marry the patient.  The patient has been feeling overwhelmed and devastated. She found out yesterday that the boyfriend had allowed the other woman to go into her home and now she feels violated. She is very upset and is demeaning herself and blaming herself. Obviously this is not her fault. She has been crying nonstop. She sleeps all the time and has no energy her mood is low and sad. She is also very anxious. She is not eating much and has lost about 20 pounds. She does not feel like doing anything. Her parents moved to [place] so she does not have a lot of support around her other than friends. She denies any thoughts of suicide or self-harm. She drinks occasional alcohol but is not using any drugs or cigarettes. She has no psychotic symptoms.  She asked if she can go back on Wellbutrin which is what she took during her first postpartum episode and I think this would be   reasonable. She also wants something "for my nerves" because she does not want to keep crying at work. She was going to a counselor in [place] before and she is going to seek this counselor out again.  The patient is assessed after 6 weeks via telephone telemedicine due to the coronavirus.  She states that she is doing somewhat better.  She is working full-time.  This makes her but nervous during the coronavirus care because she has to put her daughter in daycare.  Mood is better  but she is still not sleeping well and only sleeps about 4 hours a night.  She is very drowsy during the day and it is hard for her to stays focused at work.  She now has her daughter full-time but her ex-husband still calls all the time.  She has less anger with her ex-boyfriend and they have become friends now and are on good terms.  Her anxiety is well controlled with the clonazepam.  I suggested that we increase the trazodone to 100 mg at bedtime and that she practice good sleep hygiene and she agrees.  She denies serious depression or suicidal ideation.  Visit Diagnosis:    ICD-10-CM   1. Moderate episode of recurrent major depressive disorder (HCC) F33.1 clonazePAM (KLONOPIN) 0.5 MG tablet    Past Psychiatric History: Depression in high school, postpartum depression 3 years ago after the birth of her daughter which responded fairly well to Prozac  Past Medical History:  Past Medical History:  Diagnosis Date  . Anxiety   . Depression   . Migraines   . Seizures (HCC)    Last seizure almost one year ago  . Supervision of normal pregnancy in first trimester 07/19/2014    [facility] [place] 38 yo wm 1st Dating By LMP and US Pap  07/19/14 GC/CT Initial:                36+wks: Genetic Screen NT/IT:  CF screen  Anatomic US  Flu vaccine  Tdap Recommended ~ 28wks Glucose Screen  2 hr GBS  Feed Preference  Contraception  Circumcision  Childbirth Classes  Pediatrician      Past Surgical History:  Procedure Laterality Date  . CHOLECYSTECTOMY    . WISDOM TOOTH EXTRACTION      Family Psychiatric History: see below  Family History:  Family History  Problem Relation Age of Onset  . Congestive Heart Failure Maternal Grandmother   . Diabetes Maternal Grandfather   . Hypertension Father   . Anxiety disorder Father   . Cancer Mother        thyroid  . Anxiety disorder Mother     Social History:  Social History   Socioeconomic History  . Marital status: Married    Spouse  name: Not on file  . Number of children: Not on file  . Years of education: Not on file  . Highest education level: Not on file  Occupational History  . Not on file  Social Needs  . Financial resource strain: Not on file  . Food insecurity:    Worry: Not on file    Inability: Not on file  . Transportation needs:    Medical: Not on file    Non-medical: Not on file  Tobacco Use  . Smoking status: Never Smoker  . Smokeless tobacco: Never Used  Substance and Sexual Activity  . Alcohol use: No    Comment: 01-05-16 per pt no   . Drug use: No      Comment: 01-05-16 per pt no  . Sexual activity: Never    Birth control/protection: None  Lifestyle  . Physical activity:    Days per week: Not on file    Minutes per session: Not on file  . Stress: Not on file  Relationships  . Social connections:    Talks on phone: Not on file    Gets together: Not on file    Attends religious service: Not on file    Active member of club or organization: Not on file    Attends meetings of clubs or organizations: Not on file    Relationship status: Not on file  Other Topics Concern  . Not on file  Social History Narrative  . Not on file    Allergies: No Known Allergies  Metabolic Disorder Labs: No results found for: HGBA1C, MPG No results found for: PROLACTIN No results found for: CHOL, TRIG, HDL, CHOLHDL, VLDL, LDLCALC Lab Results  Component Value Date   TSH 0.674 07/19/2014    Therapeutic Level Labs: No results found for: LITHIUM No results found for: VALPROATE No components found for:  CBMZ  Current Medications: Current Outpatient Medications  Medication Sig Dispense Refill  . buPROPion (WELLBUTRIN XL) 300 MG 24 hr tablet Take 1 tablet (300 mg total) by mouth every morning. 30 tablet 2  . clonazePAM (KLONOPIN) 0.5 MG tablet Take one twice a day and two at night 120 tablet 2  . PARAGARD INTRAUTERINE COPPER IU by Intrauterine route.    . traZODone (DESYREL) 100 MG tablet Take 1 tablet  (100 mg total) by mouth at bedtime. 30 tablet 2   No current facility-administered medications for this visit.      Musculoskeletal: Unable to assess due to phone interview Psychiatric Specialty Exam: Review of Systems  Psychiatric/Behavioral: The patient has insomnia.   All other systems reviewed and are negative.   There were no vitals taken for this visit.There is no height or weight on file to calculate BMI.  General Appearance: NA  Eye Contact:  NA  Speech:  Clear and Coherent  Volume:  Normal  Mood:  Anxious and Euthymic  Affect:  NA  Thought Process:  Goal Directed  Orientation:  Full (Time, Place, and Person)  Thought Content: Rumination   Suicidal Thoughts:  No  Homicidal Thoughts:  No  Memory:  Immediate;   Good Recent;   Good Remote;   Good  Judgement:  Good  Insight:  Fair  Psychomotor Activity:  Normal  Concentration:  Concentration: Fair and Attention Span: Fair  Recall:  Good  Fund of Knowledge: Good  Language: Good  Akathisia:  No  Handed:  Right  AIMS (if indicated): not done  Assets:  Communication Skills Desire for Improvement Physical Health Resilience Social Support Talents/Skills  ADL's:  Intact  Cognition: WNL  Sleep:  Poor   Screenings:   Assessment and Plan: This patient is a 38 year old female with a history of depression.  This has been exacerbated by her marital break-up and then the break-up of her more recent relationship.  Both of these minute cheated on her.  She is doing much better now and seems calmer and less depressed but she still not sleeping well.  We will increase trazodone to 100 mg at bedtime.  She will continue Wellbutrin XL 300 mg in the morning for depression and clonazepam 0.5 mg twice daily and 1 mg at bedtime for sleep.  She will return to see me in 6 weeks   Neoma Laming  Harrington Challenger, MD 11/12/2018, 2:30 PM

## 2018-12-24 ENCOUNTER — Ambulatory Visit (HOSPITAL_COMMUNITY): Payer: BLUE CROSS/BLUE SHIELD | Admitting: Psychiatry

## 2019-02-05 ENCOUNTER — Other Ambulatory Visit (HOSPITAL_COMMUNITY): Payer: Self-pay | Admitting: Psychiatry

## 2019-02-05 NOTE — Telephone Encounter (Signed)
Please schedule f/u

## 2019-02-08 NOTE — Telephone Encounter (Signed)
SCHEDULED 6/24 WEDNESDAY

## 2019-02-10 ENCOUNTER — Other Ambulatory Visit: Payer: Self-pay

## 2019-02-10 ENCOUNTER — Ambulatory Visit (INDEPENDENT_AMBULATORY_CARE_PROVIDER_SITE_OTHER): Payer: Self-pay | Admitting: Psychiatry

## 2019-02-10 ENCOUNTER — Encounter (HOSPITAL_COMMUNITY): Payer: Self-pay | Admitting: Psychiatry

## 2019-02-10 ENCOUNTER — Other Ambulatory Visit (HOSPITAL_COMMUNITY): Payer: Self-pay | Admitting: Psychiatry

## 2019-02-10 DIAGNOSIS — F331 Major depressive disorder, recurrent, moderate: Secondary | ICD-10-CM

## 2019-02-10 MED ORDER — TRAZODONE HCL 100 MG PO TABS: 100.0000 mg | ORAL_TABLET | Freq: Every day | ORAL | 2 refills | Status: DC

## 2019-02-10 MED ORDER — BUPROPION HCL ER (XL) 150 MG PO TB24: ORAL_TABLET | ORAL | 2 refills | Status: DC

## 2019-02-10 MED ORDER — DULOXETINE HCL 60 MG PO CPEP: 60.0000 mg | ORAL_CAPSULE | Freq: Every day | ORAL | 2 refills | Status: DC

## 2019-02-10 MED ORDER — CLONAZEPAM 1 MG PO TABS: 1.0000 mg | ORAL_TABLET | Freq: Three times a day (TID) | ORAL | 2 refills | Status: DC

## 2019-02-10 NOTE — Progress Notes (Signed)
Virtual Visit via Telephone Note  I connected with Tina LeuJennifer D Weber on 02/10/19 at  1:40 PM EDT by telephone and verified that I am speaking with the correct person using two identifiers.   I discussed the limitations, risks, security and privacy concerns of performing an evaluation and management service by telephone and the availability of in person appointments. I also discussed with the patient that there may be a patient responsible charge related to this service. The patient expressed understanding and agreed to proceed.     I discussed the assessment and treatment plan with the patient. The patient was provided an opportunity to ask questions and all were answered. The patient agreed with the plan and demonstrated an understanding of the instructions.   The patient was advised to call back or seek an in-person evaluation if the symptoms worsen or if the condition fails to improve as anticipated.  I provided 15 minutes of non-face-to-face time during this encounter.   Tina Rudereborah Darin Arndt, MD  [facility] MD/PA/NP OP Progress Note  02/10/2019 1:58 PM Tina Weber:  [id number]  Chief Complaint:  Chief Complaint    Anxiety; Depression; Follow-up     HPI this patient is a 38 year old divorced white female who lives with her 37-year-old daughter in [place].  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  The patient returns for follow-up of depression and anxiety after 3 months.  She states that she has been very depressed because she has been laid off from work and is taking care of her daughter full-time and is very stressful.  She is not used to being a full-time mom.  Her mother helps her periodically.  She is not sleeping well and her anxiety has gotten severe.  She does not feel that the current dose of clonazepam is helping and she no longer thinks the Wellbutrin XL is helping her depression.  She requests a switch to a  different antidepressant.  She is not resting well at night and often has a very hard time getting to sleep.  Her ex-husband is no longer bothering her much but is not helping with the child either and she only sees him very periodically.  She denies suicidal ideation or any thoughts of self-harm Visit Diagnosis:    ICD-10-CM   1. Moderate episode of recurrent major depressive disorder (HCC)  F33.1     Past Psychiatric History: depression high school, postpartum depression 3 years ago after the birth of her daughter which responded fairly well to Prozac  Past Medical History:  Past Medical History:  Diagnosis Date  . Anxiety   . Depression   . Migraines   . Seizures (HCC)    Last seizure almost one year ago  . Supervision of normal pregnancy in first trimester 07/19/2014    [facility] Hortense RamalJonathan Dobosz 38 yo wm 1st Dating By LMP and US Pap  07/19/14 GC/CT Initial:                36+wks: Genetic Screen NT/IT:  CF screen  Anatomic US  Flu vaccine  Tdap Recommended ~ 28wks Glucose Screen  2 hr GBS  Feed Preference  Contraception  Circumcision  Childbirth Classes  Pediatrician      Past Surgical History:  Procedure Laterality Date  . CHOLECYSTECTOMY    . WISDOM TOOTH EXTRACTION      Family Psychiatric History: see below  Family History:  Family  History  Problem Relation Age of Onset  . Congestive Heart Failure Maternal Grandmother   . Diabetes Maternal Grandfather   . Hypertension Father   . Anxiety disorder Father   . Cancer Mother        thyroid  . Anxiety disorder Mother     Social History:  Social History   Socioeconomic History  . Marital status: Married    Spouse name: Not on file  . Number of children: Not on file  . Years of education: Not on file  . Highest education level: Not on file  Occupational History  . Not on file  Social Needs  . Financial resource strain: Not on file  . Food insecurity    Worry: Not on file    Inability: Not on file  .  Transportation needs    Medical: Not on file    Non-medical: Not on file  Tobacco Use  . Smoking status: Never Smoker  . Smokeless tobacco: Never Used  Substance and Sexual Activity  . Alcohol use: No    Comment: 01-05-16 per pt no   . Drug use: No    Comment: 01-05-16 per pt no  . Sexual activity: Never    Birth control/protection: None  Lifestyle  . Physical activity    Days per week: Not on file    Minutes per session: Not on file  . Stress: Not on file  Relationships  . Social Musicianconnections    Talks on phone: Not on file    Gets together: Not on file    Attends religious service: Not on file    Active member of club or organization: Not on file    Attends meetings of clubs or organizations: Not on file    Relationship status: Not on file  Other Topics Concern  . Not on file  Social History Narrative  . Not on file    Allergies: No Known Allergies  Metabolic Disorder Labs: No results found for: HGBA1C, MPG No results found for: PROLACTIN No results found for: CHOL, TRIG, HDL, CHOLHDL, VLDL, LDLCALC Lab Results  Component Value Date   TSH 0.674 07/19/2014    Therapeutic Level Labs: No results found for: LITHIUM No results found for: VALPROATE No components found for:  CBMZ  Current Medications: Current Outpatient Medications  Medication Sig Dispense Refill  . buPROPion (WELLBUTRIN XL) 150 MG 24 hr tablet Take daily for 2 weeks then discontinue 30 tablet 2  . clonazePAM (KLONOPIN) 1 MG tablet Take 1 tablet (1 mg total) by mouth 3 (three) times daily. 90 tablet 2  . DULoxetine (CYMBALTA) 60 MG capsule Take 1 capsule (60 mg total) by mouth daily. 30 capsule 2  . PARAGARD INTRAUTERINE COPPER IU by Intrauterine route.    . traZODone (DESYREL) 100 MG tablet Take 1 tablet (100 mg total) by mouth at bedtime. 30 tablet 2   No current facility-administered medications for this visit.      Musculoskeletal: Strength & Muscle Tone: within normal limits Gait & Station:  normal Patient leans: N/A  Psychiatric Specialty Exam: Review of Systems  Psychiatric/Behavioral: Positive for depression. The patient is nervous/anxious.   All other systems reviewed and are negative.   There were no vitals taken for this visit.There is no height or weight on file to calculate BMI.  General Appearance: NA  Eye Contact:  NA  Speech:  Clear and Coherent  Volume:  Normal  Mood:  Anxious and Depressed  Affect:  NA  Thought Process:  Goal Directed  Orientation:  Full (Time, Place, and Person)  Thought Content: Rumination   Suicidal Thoughts:  No  Homicidal Thoughts:  No  Memory:  Immediate;   Good Recent;   Good Remote;   Good  Judgement:  Good  Insight:  Fair  Psychomotor Activity:  Normal  Concentration:  Concentration: Good and Attention Span: Fair  Recall:  Good  Fund of Knowledge: Good  Language: Good  Akathisia:  No  Handed:  Right  AIMS (if indicated): not done  Assets:  Communication Skills Desire for Improvement Physical Health Resilience Social Support Talents/Skills  ADL's:  Intact  Cognition: WNL  Sleep:  Poor   Screenings:   Assessment and Plan this patient is a 38 year old female with a history of depression and anxiety.  She is under even more stress now because she has lost her job.  The Wellbutrin is not working for her so we will taper it to 150 mg daily for 2 weeks and then discontinue.  She will start Cymbalta 60 mg daily for depression.  She will increase clonazepam to 1 mg 3 times daily for anxiety and continue trazodone 100 mg at bedtime.  She will return to see me  In 4 weeks   Levonne Spiller, MD 02/10/2019, 1:58 PM

## 2019-03-06 ENCOUNTER — Other Ambulatory Visit (HOSPITAL_COMMUNITY): Payer: Self-pay | Admitting: Psychiatry

## 2019-03-15 ENCOUNTER — Other Ambulatory Visit: Payer: Self-pay

## 2019-03-15 ENCOUNTER — Ambulatory Visit (INDEPENDENT_AMBULATORY_CARE_PROVIDER_SITE_OTHER): Payer: Self-pay | Admitting: Psychiatry

## 2019-03-15 ENCOUNTER — Encounter (HOSPITAL_COMMUNITY): Payer: Self-pay | Admitting: Psychiatry

## 2019-03-15 ENCOUNTER — Telehealth (HOSPITAL_COMMUNITY): Payer: Self-pay | Admitting: Psychiatry

## 2019-03-15 DIAGNOSIS — F331 Major depressive disorder, recurrent, moderate: Secondary | ICD-10-CM

## 2019-03-15 MED ORDER — CLONAZEPAM 1 MG PO TABS: 1.0000 mg | ORAL_TABLET | Freq: Three times a day (TID) | ORAL | 2 refills | Status: DC

## 2019-03-15 MED ORDER — DULOXETINE HCL 60 MG PO CPEP: 60.0000 mg | ORAL_CAPSULE | Freq: Every day | ORAL | 2 refills | Status: DC

## 2019-03-15 MED ORDER — ZOLPIDEM TARTRATE 10 MG PO TABS: 10.0000 mg | ORAL_TABLET | Freq: Every evening | ORAL | 2 refills | Status: DC | PRN

## 2019-03-15 MED ORDER — BUPROPION HCL ER (XL) 150 MG PO TB24: ORAL_TABLET | ORAL | 2 refills | Status: DC

## 2019-03-15 NOTE — Progress Notes (Signed)
Virtual Visit via Telephone Note  I connected with Tina Weber on 03/15/19 at  2:40 PM EDT by telephone and verified that I am speaking with the correct person using two identifiers.   I discussed the limitations, risks, security and privacy concerns of performing an evaluation and management service by telephone and the availability of in person appointments. I also discussed with the patient that there may be a patient responsible charge related to this service. The patient expressed understanding and agreed to proceed.      I discussed the assessment and treatment plan with the patient. The patient was provided an opportunity to ask questions and all were answered. The patient agreed with the plan and demonstrated an understanding of the instructions.   The patient was advised to call back or seek an in-person evaluation if the symptoms worsen or if the condition fails to improve as anticipated.  I provided 15 minutes of non-face-to-face time during this encounter.   Levonne Spiller, MD  [facility] MD/PA/NP OP Progress Note  03/15/2019 3:01 PM Tina Weber  MRN:  [id number]  Chief Complaint:  Chief Complaint    Depression; Anxiety; Follow-up     HPI: This patient is a 38 year old divorced white female who lives with her 28-year-old daughter in [place].  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  The patient returns after 4 weeks.  Last time she was extremely anxious about being laid off and felt very depressed and worried.  She is also been taking care of her daughter full-time.  We changed her Wellbutrin to Cymbalta and she seems to be doing better with this.  She still is not sleeping well and lays awake worrying.  The trazodone is not helping.  It looks like from the chart she took Ambien in the past and she states that this worked better so we will change this over.  She continues to use the clonazepam 1 mg 3 times daily.   She knows that she has a very good resume and it is only a matter of time before she gets tired but it is extremely difficult during the COVID-19 pandemic.  Fortunately her mother and other relatives are being very supportive. Visit Diagnosis:    ICD-10-CM   1. Moderate episode of recurrent major depressive disorder (HCC)  F33.1     Past Psychiatric History: The patient had depression in high school and postpartum depression 3 years ago after the birth of her daughter which responded fairly well to Prozac  Past Medical History:  Past Medical History:  Diagnosis Date  . Anxiety   . Depression   . Migraines   . Seizures ([place])    Last seizure almost one year ago  . Supervision of normal pregnancy in first trimester 07/19/2014    [place] 38 yo wm 1st Dating By LMP and [place] Pap  07/19/14 GC/CT Initial:                36+wks: Genetic Screen NT/IT:  CF screen  Anatomic [place]  Flu vaccine  Tdap Recommended ~ 28wks Glucose Screen  2 hr GBS  Feed Preference  Contraception  Circumcision  Childbirth Classes  Pediatrician      Past Surgical History:  Procedure Laterality Date  . CHOLECYSTECTOMY    . WISDOM TOOTH EXTRACTION      Family Psychiatric History: See below  Family History:  Family History  Problem Relation  Age of Onset  . Congestive Heart Failure Maternal Grandmother   . Diabetes Maternal Grandfather   . Hypertension Father   . Anxiety disorder Father   . Cancer Mother        thyroid  . Anxiety disorder Mother     Social History:  Social History   Socioeconomic History  . Marital status: Married    Spouse name: Not on file  . Number of children: Not on file  . Years of education: Not on file  . Highest education level: Not on file  Occupational History  . Not on file  Social Needs  . Financial resource strain: Not on file  . Food insecurity    Worry: Not on file    Inability: Not on file  . Transportation needs    Medical: Not on file     Non-medical: Not on file  Tobacco Use  . Smoking status: Never Smoker  . Smokeless tobacco: Never Used  Substance and Sexual Activity  . Alcohol use: No    Comment: 01-05-16 per pt no   . Drug use: No    Comment: 01-05-16 per pt no  . Sexual activity: Never    Birth control/protection: None  Lifestyle  . Physical activity    Days per week: Not on file    Minutes per session: Not on file  . Stress: Not on file  Relationships  . Social Musicianconnections    Talks on phone: Not on file    Gets together: Not on file    Attends religious service: Not on file    Active member of club or organization: Not on file    Attends meetings of clubs or organizations: Not on file    Relationship status: Not on file  Other Topics Concern  . Not on file  Social History Narrative  . Not on file    Allergies: No Known Allergies  Metabolic Disorder Labs: No results found for: HGBA1C, MPG No results found for: PROLACTIN No results found for: CHOL, TRIG, HDL, CHOLHDL, VLDL, LDLCALC Lab Results  Component Value Date   TSH 0.674 07/19/2014    Therapeutic Level Labs: No results found for: LITHIUM No results found for: VALPROATE No components found for:  CBMZ  Current Medications: Current Outpatient Medications  Medication Sig Dispense Refill  . buPROPion (WELLBUTRIN XL) 150 MG 24 hr tablet Take daily for 2 weeks then discontinue 30 tablet 2  . clonazePAM (KLONOPIN) 1 MG tablet Take 1 tablet (1 mg total) by mouth 3 (three) times daily. 90 tablet 2  . DULoxetine (CYMBALTA) 60 MG capsule Take 1 capsule (60 mg total) by mouth daily. 30 capsule 2  . PARAGARD INTRAUTERINE COPPER IU by Intrauterine route.    Marland Kitchen. zolpidem (AMBIEN) 10 MG tablet Take 1 tablet (10 mg total) by mouth at bedtime as needed for sleep. 30 tablet 2   No current facility-administered medications for this visit.      Musculoskeletal: Strength & Muscle Tone: within normal limits Gait & Station: normal Patient leans:  N/A  Psychiatric Specialty Exam: Review of Systems  Psychiatric/Behavioral: The patient is nervous/anxious and has insomnia.   All other systems reviewed and are negative.   There were no vitals taken for this visit.There is no height or weight on file to calculate BMI.  General Appearance: NA  Eye Contact:  NA  Speech:  Clear and Coherent  Volume:  Normal  Mood:  Anxious  Affect:  NA  Thought Process:  Goal  Directed  Orientation:  Full (Time, Place, and Person)  Thought Content: Rumination   Suicidal Thoughts:  No  Homicidal Thoughts:  No  Memory:  Immediate;   Good Recent;   Good Remote;   Good  Judgement:  Good  Insight:  Good  Psychomotor Activity:  Normal  Concentration:  Concentration: Good and Attention Span: Good  Recall:  Good  Fund of Knowledge: Good  Language: Good  Akathisia:  No  Handed:  Right  AIMS (if indicated): not done  Assets:  Communication Skills Desire for Improvement Physical Health Resilience Social Support Talents/Skills  ADL's:  Intact  Cognition: WNL  Sleep:  Poor   Screenings:   Assessment and Plan: This patient is a 38 year old female with a history of depression and anxiety.  She seems to be doing better on Cymbalta 60 mg daily so this will be continued for depression.  She will also continue clonazepam 1 mg 3 times daily for anxiety.  Trazodone will be discontinued in favor of Ambien 10 mg at bedtime for sleep.  She will return to see me in 2 months   Diannia Rudereborah Ross, MD 03/15/2019, 3:01 PM

## 2019-03-23 ENCOUNTER — Telehealth (HOSPITAL_COMMUNITY): Payer: Self-pay | Admitting: *Deleted

## 2019-03-23 ENCOUNTER — Other Ambulatory Visit (HOSPITAL_COMMUNITY): Payer: Self-pay | Admitting: Psychiatry

## 2019-03-23 MED ORDER — ZOLPIDEM TARTRATE 10 MG PO TABS: 10.0000 mg | ORAL_TABLET | Freq: Every evening | ORAL | 2 refills | Status: DC | PRN

## 2019-03-23 MED ORDER — CLONAZEPAM 1 MG PO TABS: 1.0000 mg | ORAL_TABLET | Freq: Three times a day (TID) | ORAL | 2 refills | Status: DC

## 2019-03-23 NOTE — Telephone Encounter (Signed)
sent 

## 2019-03-23 NOTE — Telephone Encounter (Signed)
PATIENT CALLED STATING THAT SHE THINKS HER MED'S WENT TO THE WRONG RX . INFORMED HER THAT UNDER RX PREFERENCES [facility] SHOWS AS PRIMARY  & [facility] SECOND[facility]. PATIENT STATES SHE HASN'T USE [facility] IN MONTH'S .   UPDATED RX REFERENCES. [facility] HAS BEEN NOTIFIED TO VOID  RX FOR  THE [place]  & KLONOPIN. PLEASE RESEND SCRIPTS TO  [facility]

## 2019-05-17 ENCOUNTER — Encounter (HOSPITAL_COMMUNITY): Payer: Self-pay | Admitting: Psychiatry

## 2019-05-17 ENCOUNTER — Ambulatory Visit (INDEPENDENT_AMBULATORY_CARE_PROVIDER_SITE_OTHER): Payer: Medicaid Other | Admitting: Psychiatry

## 2019-05-17 ENCOUNTER — Other Ambulatory Visit: Payer: Self-pay

## 2019-05-17 DIAGNOSIS — F331 Major depressive disorder, recurrent, moderate: Secondary | ICD-10-CM

## 2019-05-17 MED ORDER — CLONAZEPAM 1 MG PO TABS: 1.0000 mg | ORAL_TABLET | Freq: Four times a day (QID) | ORAL | 2 refills | Status: DC

## 2019-05-17 MED ORDER — VENLAFAXINE HCL ER 150 MG PO CP24: 150.0000 mg | ORAL_CAPSULE | Freq: Every day | ORAL | 2 refills | Status: DC

## 2019-05-17 MED ORDER — ZOLPIDEM TARTRATE 10 MG PO TABS: 10.0000 mg | ORAL_TABLET | Freq: Every evening | ORAL | 2 refills | Status: DC | PRN

## 2019-05-17 NOTE — Progress Notes (Signed)
Virtual Visit via Video Note  I connected with Tina Weber on 05/17/19 at  3:00 PM EDT by a video enabled telemedicine application and verified that I am speaking with the correct person using two identifiers.   I discussed the limitations of evaluation and management by telemedicine and the availability of in person appointments. The patient expressed understanding and agreed to proceed.    I discussed the assessment and treatment plan with the patient. The patient was provided an opportunity to ask questions and all were answered. The patient agreed with the plan and demonstrated an understanding of the instructions.   The patient was advised to call back or seek an in-person evaluation if the symptoms worsen or if the condition fails to improve as anticipated.  I provided 15 minutes of non-face-to-face time during this encounter.   Levonne Spiller, MD  [facility] MD/PA/NP OP Progress Note  05/17/2019 3:32 PM Tina Weber  MRN:  [id number]  Chief Complaint:  Chief Complaint    Depression; Anxiety; Follow-up     HPI: This patient is a 38 year old divorced white female who lives with her 67-year-old daughter in [place].  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  The patient returns after 2 months.  She states that she is not doing well.  Particularly during the last 2 weeks she has become very depressed, crying all the time and somewhat hopeless.  She has not been able to find a new job.  She is not getting very much money on unemployment or food stamps and she is very worried about finances.  Her ex-husband is not helping financially.  Her parents are helping her but she feels embarrassed about this.  She states that her energy is low and is hard for her to get off the couch to do anything or even take a shower.  She has been applying for jobs but no one is Conservation officer, nature.  Her sleep is somewhat interrupted and she is extremely anxious.   I suggested we try another antidepressant but which somewhat limited because she has no health insurance at this point did not qualify to get Medicaid.  She has tried Prozac Wellbutrin and now Cymbalta I suggested we try Effexor XR.  She also asked about increasing the clonazepam and I think we can do this temporarily since she is so anxious and not sleeping well.  She denies any thoughts of self-harm or suicidal ideation Visit Diagnosis:    ICD-10-CM   1. Moderate episode of recurrent major depressive disorder (HCC)  F33.1     Past Psychiatric History: The patient has a history of depression in high school and postpartum depression 3 years ago after the birth of her daughter which responded fairly well to Prozac  Past Medical History:  Past Medical History:  Diagnosis Date  . Anxiety   . Depression   . Migraines   . Seizures ([place])    Last seizure almost one year ago  . Supervision of normal pregnancy in first trimester 07/19/2014    [place] 38 yo wm 1st Dating By LMP and [place] Pap  07/19/14 GC/CT Initial:                36+wks: Genetic Screen NT/IT:  CF screen  Anatomic [place]  Flu vaccine  Tdap Recommended ~ 28wks Glucose Screen  2 hr GBS  Feed Preference  Contraception  Circumcision  Childbirth Classes  Pediatrician  Past Surgical History:  Procedure Laterality Date  . CHOLECYSTECTOMY    . WISDOM TOOTH EXTRACTION      Family Psychiatric History: see below  Family History:  Family History  Problem Relation Age of Onset  . Congestive Heart Failure Maternal Grandmother   . Diabetes Maternal Grandfather   . Hypertension Father   . Anxiety disorder Father   . Cancer Mother        thyroid  . Anxiety disorder Mother     Social History:  Social History   Socioeconomic History  . Marital status: Married    Spouse name: Not on file  . Number of children: Not on file  . Years of education: Not on file  . Highest education level: Not on file   Occupational History  . Not on file  Social Needs  . Financial resource strain: Not on file  . Food insecurity    Worry: Not on file    Inability: Not on file  . Transportation needs    Medical: Not on file    Non-medical: Not on file  Tobacco Use  . Smoking status: Never Smoker  . Smokeless tobacco: Never Used  Substance and Sexual Activity  . Alcohol use: No    Comment: 01-05-16 per pt no   . Drug use: No    Comment: 01-05-16 per pt no  . Sexual activity: Never    Birth control/protection: None  Lifestyle  . Physical activity    Days per week: Not on file    Minutes per session: Not on file  . Stress: Not on file  Relationships  . Social Musicianconnections    Talks on phone: Not on file    Gets together: Not on file    Attends religious service: Not on file    Active member of club or organization: Not on file    Attends meetings of clubs or organizations: Not on file    Relationship status: Not on file  Other Topics Concern  . Not on file  Social History Narrative  . Not on file    Allergies: No Known Allergies  Metabolic Disorder Labs: No results found for: HGBA1C, MPG No results found for: PROLACTIN No results found for: CHOL, TRIG, HDL, CHOLHDL, VLDL, LDLCALC Lab Results  Component Value Date   TSH 0.674 07/19/2014    Therapeutic Level Labs: No results found for: LITHIUM No results found for: VALPROATE No components found for:  CBMZ  Current Medications: Current Outpatient Medications  Medication Sig Dispense Refill  . clonazePAM (KLONOPIN) 1 MG tablet Take 1 tablet (1 mg total) by mouth 4 (four) times daily. 120 tablet 2  . PARAGARD INTRAUTERINE COPPER IU by Intrauterine route.    . venlafaxine XR (EFFEXOR-XR) 150 MG 24 hr capsule Take 1 capsule (150 mg total) by mouth daily. 30 capsule 2  . zolpidem (AMBIEN) 10 MG tablet Take 1 tablet (10 mg total) by mouth at bedtime as needed for sleep. 30 tablet 2   No current facility-administered medications for  this visit.      Musculoskeletal: Strength & Muscle Tone: within normal limits Gait & Station: normal Patient leans: N/A  Psychiatric Specialty Exam: Review of Systems  Constitutional: Positive for malaise/fatigue.  Psychiatric/Behavioral: Positive for depression. The patient is nervous/anxious and has insomnia.   All other systems reviewed and are negative.   There were no vitals taken for this visit.There is no height or weight on file to calculate BMI.  General Appearance: Casual and  Fairly Groomed  Eye Contact:  Good  Speech:  Clear and Coherent  Volume:  Normal  Mood:  Anxious and Depressed  Affect:  Constricted and Tearful  Thought Process:  Goal Directed  Orientation:  Full (Time, Place, and Person)  Thought Content: Rumination   Suicidal Thoughts:  No  Homicidal Thoughts:  No  Memory:  Immediate;   Good Recent;   Good Remote;   Fair  Judgement:  Good  Insight:  Good  Psychomotor Activity:  Decreased  Concentration:  Concentration: Good and Attention Span: Good  Recall:  Good  Fund of Knowledge: Good  Language: Good  Akathisia:  No  Handed:  Right  AIMS (if indicated): not done  Assets:  Communication Skills Desire for Improvement Physical Health Resilience Social Support Talents/Skills  ADL's:  Intact  Cognition: WNL  Sleep:  Good   Screenings:   Assessment and Plan: This patient is a 38 year old female with a history of depression and anxiety.  She is not doing well on Cymbalta anymore so we will switch to Effexor XR 150 mg daily.  She will increase clonazepam to 1 mg 4 times a day for anxiety and continue Ambien 10 mg at bedtime for sleep.  She will return to see me in 4 weeks.  I will see if she can be signed on for charity care so we can get her more services   [name], MD 05/17/2019, 3:32 PM

## 2019-06-10 ENCOUNTER — Other Ambulatory Visit (HOSPITAL_COMMUNITY): Payer: Self-pay | Admitting: Psychiatry

## 2019-08-05 ENCOUNTER — Ambulatory Visit: Payer: Medicaid Other | Attending: Internal Medicine

## 2019-08-05 DIAGNOSIS — Z20822 Contact with and (suspected) exposure to covid-19: Secondary | ICD-10-CM

## 2019-08-05 DIAGNOSIS — Z20828 Contact with and (suspected) exposure to other viral communicable diseases: Secondary | ICD-10-CM | POA: Diagnosis not present

## 2019-08-06 LAB — NOVEL CORONAVIRUS, NAA: SARS-CoV-2, NAA: NOT DETECTED

## 2019-09-20 ENCOUNTER — Encounter: Payer: Self-pay | Admitting: Family Medicine

## 2019-10-11 ENCOUNTER — Other Ambulatory Visit (HOSPITAL_COMMUNITY): Payer: Self-pay | Admitting: Psychiatry

## 2019-10-11 NOTE — Telephone Encounter (Signed)
LVM FOR PATIENT TO CALL BACK TO SCHEDULE APPT. LAST APPT 05/17/2019. APPT NEEDED TO CONTINUE MED REFILLS REQUESTED BY Rx

## 2019-10-11 NOTE — Telephone Encounter (Signed)
Call pt for appt 

## 2019-11-11 ENCOUNTER — Ambulatory Visit (INDEPENDENT_AMBULATORY_CARE_PROVIDER_SITE_OTHER): Payer: Medicaid Other | Admitting: Psychiatry

## 2019-11-11 ENCOUNTER — Encounter (HOSPITAL_COMMUNITY): Payer: Self-pay | Admitting: Psychiatry

## 2019-11-11 ENCOUNTER — Other Ambulatory Visit: Payer: Self-pay

## 2019-11-11 DIAGNOSIS — F331 Major depressive disorder, recurrent, moderate: Secondary | ICD-10-CM

## 2019-11-11 MED ORDER — VENLAFAXINE HCL ER 150 MG PO CP24: ORAL_CAPSULE | ORAL | 2 refills | Status: DC

## 2019-11-11 MED ORDER — ZOLPIDEM TARTRATE 10 MG PO TABS: 10.0000 mg | ORAL_TABLET | Freq: Every evening | ORAL | 2 refills | Status: DC | PRN

## 2019-11-11 MED ORDER — CLONAZEPAM 1 MG PO TABS: 1.0000 mg | ORAL_TABLET | Freq: Four times a day (QID) | ORAL | 2 refills | Status: DC

## 2019-11-11 NOTE — Progress Notes (Signed)
Virtual Visit via Video Note  I connected with Tina Weber on 11/11/19 at  9:00 AM EDT by a video enabled telemedicine application and verified that I am speaking with the correct person using two identifiers.   I discussed the limitations of evaluation and management by telemedicine and the availability of in person appointments. The patient expressed understanding and agreed to proceed.    I discussed the assessment and treatment plan with the patient. The patient was provided an opportunity to ask questions and all were answered. The patient agreed with the plan and demonstrated an understanding of the instructions.   The patient was advised to call back or seek an in-person evaluation if the symptoms worsen or if the condition fails to improve as anticipated.  I provided 15 minutes of non-face-to-face time during this encounter.   [name], MD  [facility] MD/PA/NP OP Progress Note  11/11/2019 9:14 AM Tina Weber  MRN:  [id number]  Chief Complaint:  Chief Complaint    Depression; Anxiety; Follow-up     HPI: This patient is a 39 year old divorced white female who lives with her 46-year-old daughter in [place].  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  The patient returns after a 22-month absence.  She states that she is doing much better.  She has found a new job and has been there a month and really enjoys it.  She states that the company is very supportive.  She will soon get health insurance which will help financially.  She and her daughter have moved to [place] to be closer to her new job.  She states that she still has some trouble sleep but the Ambien plus clonazepam has helped.  I warned her not to take all this together and to space them out and she agrees to do so.  The Effexor XR continues to help her mood and she is very upbeat and bubbly today and seems to be in a much better mood than I have seen her  in about a year.  Her ex-husband is getting help now and is no longer as angry and intrusive and is spending time with their daughter under her supervision. Visit Diagnosis:    ICD-10-CM   1. Moderate episode of recurrent major depressive disorder (HCC)  F33.1     Past Psychiatric History: The patient has a history of depression in high school and postpartum depression after the birth of her daughter which responded fairly well to Prozac  Past Medical History:  Past Medical History:  Diagnosis Date  . Anxiety   . Depression   . Migraines   . Seizures (HCC)    Last seizure almost one year ago  . Supervision of normal pregnancy in first trimester 07/19/2014    [facility] lind ausley 39 yo wm 1st Dating By LMP and [place] Pap  07/19/14 GC/CT Initial:                36+wks: Genetic Screen NT/IT:  CF screen  Anatomic [place]  Flu vaccine  Tdap Recommended ~ 28wks Glucose Screen  2 hr GBS  Feed Preference  Contraception  Circumcision  Childbirth Classes  Pediatrician      Past Surgical History:  Procedure Laterality Date  . CHOLECYSTECTOMY    . WISDOM TOOTH EXTRACTION      Family Psychiatric History: see below  Family History:  Family History  Problem Relation Age of Onset  . Congestive Heart Failure Maternal Grandmother   . Diabetes Maternal Grandfather   . Hypertension Father   . Anxiety disorder Father   . Cancer Mother        thyroid  . Anxiety disorder Mother     Social History:  Social History   Socioeconomic History  . Marital status: Married    Spouse name: Not on file  . Number of children: Not on file  . Years of education: Not on file  . Highest education level: Not on file  Occupational History  . Not on file  Tobacco Use  . Smoking status: Never Smoker  . Smokeless tobacco: Never Used  Substance and Sexual Activity  . Alcohol use: No    Comment: 01-05-16 per pt no   . Drug use: No    Comment: 01-05-16 per pt no  . Sexual activity: Never    Birth  control/protection: None  Other Topics Concern  . Not on file  Social History Narrative  . Not on file   Social Determinants of Health   Financial Resource Strain:   . Difficulty of Paying Living Expenses:   Food Insecurity:   . Worried About Charity fundraiser in the Last Year:   . Arboriculturist in the Last Year:   Transportation Needs:   . Film/video editor (Medical):   Marland Kitchen Lack of Transportation (Non-Medical):   Physical Activity:   . Days of Exercise per Week:   . Minutes of Exercise per Session:   Stress:   . Feeling of Stress :   Social Connections:   . Frequency of Communication with Friends and Family:   . Frequency of Social Gatherings with Friends and Family:   . Attends Religious Services:   . Active Member of Clubs or Organizations:   . Attends Archivist Meetings:   Marland Kitchen Marital Status:     Allergies: No Known Allergies  Metabolic Disorder Labs: No results found for: HGBA1C, MPG No results found for: PROLACTIN No results found for: CHOL, TRIG, HDL, CHOLHDL, VLDL, LDLCALC Lab Results  Component Value Date   TSH 0.674 07/19/2014    Therapeutic Level Labs: No results found for: LITHIUM No results found for: VALPROATE No components found for:  CBMZ  Current Medications: Current Outpatient Medications  Medication Sig Dispense Refill  . clonazePAM (KLONOPIN) 1 MG tablet Take 1 tablet (1 mg total) by mouth 4 (four) times daily. 120 tablet 2  . PARAGARD INTRAUTERINE COPPER IU by Intrauterine route.    . venlafaxine XR (EFFEXOR-XR) 150 MG 24 hr capsule TAKE 1 CAPSULE BY MOUTH EVERY DAY 30 capsule 2  . zolpidem (AMBIEN) 10 MG tablet Take 1 tablet (10 mg total) by mouth at bedtime as needed. for sleep 30 tablet 2   No current facility-administered medications for this visit.     Musculoskeletal: Strength & Muscle Tone: within normal limits Gait & Station: normal Patient leans: N/A  Psychiatric Specialty Exam: Review of Systems   Psychiatric/Behavioral: Positive for suicidal ideas.  All other systems reviewed and are negative.   There were no vitals taken for this visit.There is no height or weight on file to calculate BMI.  General Appearance: Casual, Neat and Well Groomed  Eye Contact:  Good  Speech:  Clear and Coherent  Volume:  Normal  Mood:  Euthymic  Affect:  Appropriate and Congruent  Thought Process:  Goal Directed  Orientation:  Full (Time, Place, and  Person)  Thought Content: WDL   Suicidal Thoughts:  No  Homicidal Thoughts:  No  Memory:  Immediate;   Good Recent;   Good Remote;   Good  Judgement:  Good  Insight:  Good  Psychomotor Activity:  Normal  Concentration:  Concentration: Good and Attention Span: Good  Recall:  Good  Fund of Knowledge: Good  Language: Good  Akathisia:  No  Handed:  Right  AIMS (if indicated): not done  Assets:  Communication Skills Desire for Improvement Physical Health Resilience Social Support Talents/Skills  ADL's:  Intact  Cognition: WNL  Sleep:  Fair   Screenings:   Assessment and Plan: This patient is a 39 year old female with a history of depression anxiety.  She seems to have made a new life for herself and is doing much better in her new job and living situation.  She will continue to new Effexor XR 150 mg daily for depression, clonazepam 1 mg 4 times daily for anxiety and continue Ambien 10 mg at bedtime for sleep.  She will return to see me in 3 months.   [name], MD 11/11/2019, 9:14 AM

## 2020-02-10 ENCOUNTER — Other Ambulatory Visit (HOSPITAL_COMMUNITY): Payer: Self-pay | Admitting: Psychiatry

## 2020-02-10 ENCOUNTER — Telehealth (HOSPITAL_COMMUNITY): Payer: Self-pay | Admitting: *Deleted

## 2020-02-10 ENCOUNTER — Ambulatory Visit (HOSPITAL_COMMUNITY): Payer: Medicaid Other | Admitting: Psychiatry

## 2020-02-10 MED ORDER — ZOLPIDEM TARTRATE 10 MG PO TABS: 10.0000 mg | ORAL_TABLET | Freq: Every evening | ORAL | 0 refills | Status: DC | PRN

## 2020-02-10 MED ORDER — CLONAZEPAM 1 MG PO TABS: 1.0000 mg | ORAL_TABLET | Freq: Four times a day (QID) | ORAL | 0 refills | Status: DC

## 2020-02-10 MED ORDER — VENLAFAXINE HCL ER 150 MG PO CP24: ORAL_CAPSULE | ORAL | 0 refills | Status: DC

## 2020-02-10 NOTE — Telephone Encounter (Signed)
Called stating that her medications will run out tomorrow and was scheduled to see provider today but provider is out of the office. Patient would like to know if she could get refills for her medications until her next appt with provider.

## 2020-02-10 NOTE — Telephone Encounter (Signed)
Ordered

## 2020-02-10 NOTE — Telephone Encounter (Signed)
LMOM

## 2020-02-16 ENCOUNTER — Telehealth (INDEPENDENT_AMBULATORY_CARE_PROVIDER_SITE_OTHER): Payer: Medicaid Other | Admitting: Psychiatry

## 2020-02-16 ENCOUNTER — Other Ambulatory Visit: Payer: Self-pay

## 2020-02-16 DIAGNOSIS — Z91199 Patient's noncompliance with other medical treatment and regimen due to unspecified reason: Secondary | ICD-10-CM

## 2020-02-16 DIAGNOSIS — Z5329 Procedure and treatment not carried out because of patient's decision for other reasons: Secondary | ICD-10-CM

## 2020-03-10 ENCOUNTER — Telehealth (HOSPITAL_COMMUNITY): Payer: Self-pay | Admitting: *Deleted

## 2020-03-10 ENCOUNTER — Other Ambulatory Visit (HOSPITAL_COMMUNITY): Payer: Self-pay | Admitting: Psychiatry

## 2020-03-10 MED ORDER — VENLAFAXINE HCL ER 150 MG PO CP24: ORAL_CAPSULE | ORAL | 0 refills | Status: DC

## 2020-03-10 MED ORDER — CLONAZEPAM 1 MG PO TABS: 1.0000 mg | ORAL_TABLET | Freq: Four times a day (QID) | ORAL | 0 refills | Status: DC

## 2020-03-10 MED ORDER — ZOLPIDEM TARTRATE 10 MG PO TABS: 10.0000 mg | ORAL_TABLET | Freq: Every evening | ORAL | 0 refills | Status: DC | PRN

## 2020-03-10 NOTE — Telephone Encounter (Signed)
Patient called stating she is going to run out of her Ambien and Klonopin before her appt. Pt would like to know if provider could send in more refills before her appt.

## 2020-03-10 NOTE — Telephone Encounter (Signed)
sent 

## 2020-03-13 ENCOUNTER — Encounter (HOSPITAL_COMMUNITY): Payer: Self-pay | Admitting: *Deleted

## 2020-03-13 ENCOUNTER — Encounter (HOSPITAL_COMMUNITY): Payer: Self-pay | Admitting: Psychiatry

## 2020-03-13 ENCOUNTER — Telehealth (INDEPENDENT_AMBULATORY_CARE_PROVIDER_SITE_OTHER): Payer: Medicaid Other | Admitting: Psychiatry

## 2020-03-13 ENCOUNTER — Other Ambulatory Visit: Payer: Self-pay

## 2020-03-13 DIAGNOSIS — F331 Major depressive disorder, recurrent, moderate: Secondary | ICD-10-CM | POA: Diagnosis not present

## 2020-03-13 MED ORDER — VENLAFAXINE HCL ER 150 MG PO CP24: ORAL_CAPSULE | ORAL | 2 refills | Status: DC

## 2020-03-13 MED ORDER — ZOLPIDEM TARTRATE 10 MG PO TABS: 10.0000 mg | ORAL_TABLET | Freq: Every evening | ORAL | 0 refills | Status: DC | PRN

## 2020-03-13 MED ORDER — TRAZODONE HCL 100 MG PO TABS: 100.0000 mg | ORAL_TABLET | Freq: Every day | ORAL | 2 refills | Status: DC

## 2020-03-13 MED ORDER — CLONAZEPAM 1 MG PO TABS: 1.0000 mg | ORAL_TABLET | Freq: Four times a day (QID) | ORAL | 0 refills | Status: DC

## 2020-03-13 NOTE — Progress Notes (Signed)
Virtual Visit via Video Note  I connected with Tina Weber on 03/13/20 at  9:40 AM EDT by a video enabled telemedicine application and verified that I am speaking with the correct person using two identifiers.   I discussed the limitations of evaluation and management by telemedicine and the availability of in person appointments. The patient expressed understanding and agreed to proceed.    I discussed the assessment and treatment plan with the patient. The patient was provided an opportunity to ask questions and all were answered. The patient agreed with the plan and demonstrated an understanding of the instructions.   The patient was advised to call back or seek an in-person evaluation if the symptoms worsen or if the condition fails to improve as anticipated.  I provided 15 minutes of non-face-to-face time during this encounter. Location: Provider office, patient work  Tina Ruder, MD  [facility] MD/PA/NP OP Progress Note  03/13/2020 10:08 AM [name]  MRN:  [id number]  Chief Complaint:  Chief Complaint    Depression; Anxiety; Follow-up     HPI: This patient is a 39 year old divorced white female who lives with her 67-year-old daughter in [place].  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  The patient returns for follow-up after 4 months.  She states that this last month has been very difficult.  On July 4 weekend her daughter went to visitation with her father.  She states that the father was visiting a girlfriend and the girlfriend's dog bit her daughter in the face requiring stitches.  The patient was very upset about this and does not want the daughter to have any further visits with the father.  She is made a child protection report and is trying to serve her ex-husband with papers so that she can gain full custody and allow him only supervised visitation.  She states that he has been harassing her on social  media regarding this.  She has had a lot of difficulty sleeping and extremely anxious.  She does still like her job and is trying to stay calm in front of her daughter.  The patient is not sleeping that well with Ambien but she is afraid that without it she will not sleep at all.  I suggested that we try trazodone 100 mg and if it works better she can stop the Ambien.  For now we will send in both to see which can work better for her.  She denies serious depression and she is already on clonazepam 1 mg 4 times daily for anxiety.  I suggested counseling here and she agrees. Visit Diagnosis:    ICD-10-CM   1. Moderate episode of recurrent major depressive disorder (HCC)  F33.1     Past Psychiatric History: The patient has a history of depression in high school and postpartum depression after the birth of her daughter which responded fairly well to Prozac  Past Medical History:  Past Medical History:  Diagnosis Date  . Anxiety   . Depression   . Migraines   . Seizures (HCC)    Last seizure almost one year ago  . Supervision of normal pregnancy in first trimester 07/19/2014    [facility] kischa altice 39 yo wm 1st Dating By LMP and [place] Pap  07/19/14 GC/CT Initial:                36+wks: Genetic Screen NT/IT:  CF screen  Anatomic [place]  Flu vaccine  Tdap Recommended ~ 28wks Glucose Screen  2 hr GBS  Feed Preference  Contraception  Circumcision  Childbirth Classes  Pediatrician      Past Surgical History:  Procedure Laterality Date  . CHOLECYSTECTOMY    . WISDOM TOOTH EXTRACTION      Family Psychiatric History: see below   Family History:  Family History  Problem Relation Age of Onset  . Congestive Heart Failure Maternal Grandmother   . Diabetes Maternal Grandfather   . Hypertension Father   . Anxiety disorder Father   . Cancer Mother        thyroid  . Anxiety disorder Mother     Social History:  Social History   Socioeconomic History  . Marital status: Married     Spouse name: Not on file  . Number of children: Not on file  . Years of education: Not on file  . Highest education level: Not on file  Occupational History  . Not on file  Tobacco Use  . Smoking status: Never Smoker  . Smokeless tobacco: Never Used  Substance and Sexual Activity  . Alcohol use: No    Comment: 01-05-16 per pt no   . Drug use: No    Comment: 01-05-16 per pt no  . Sexual activity: Never    Birth control/protection: None  Other Topics Concern  . Not on file  Social History Narrative  . Not on file   Social Determinants of Health   Financial Resource Strain:   . Difficulty of Paying Living Expenses:   Food Insecurity:   . Worried About Programme researcher, broadcasting/film/video in the Last Year:   . Barista in the Last Year:   Transportation Needs:   . Freight forwarder (Medical):   Marland Kitchen Lack of Transportation (Non-Medical):   Physical Activity:   . Days of Exercise per Week:   . Minutes of Exercise per Session:   Stress:   . Feeling of Stress :   Social Connections:   . Frequency of Communication with Friends and Family:   . Frequency of Social Gatherings with Friends and Family:   . Attends Religious Services:   . Active Member of Clubs or Organizations:   . Attends Banker Meetings:   Marland Kitchen Marital Status:     Allergies: No Known Allergies  Metabolic Disorder Labs: No results found for: HGBA1C, MPG No results found for: PROLACTIN No results found for: CHOL, TRIG, HDL, CHOLHDL, VLDL, LDLCALC Lab Results  Component Value Date   TSH 0.674 07/19/2014    Therapeutic Level Labs: No results found for: LITHIUM No results found for: VALPROATE No components found for:  CBMZ  Current Medications: Current Outpatient Medications  Medication Sig Dispense Refill  . clonazePAM (KLONOPIN) 1 MG tablet Take 1 tablet (1 mg total) by mouth 4 (four) times daily. 120 tablet 0  . PARAGARD INTRAUTERINE COPPER IU by Intrauterine route.    . traZODone (DESYREL) 100 MG  tablet Take 1 tablet (100 mg total) by mouth at bedtime. 30 tablet 2  . venlafaxine XR (EFFEXOR-XR) 150 MG 24 hr capsule TAKE 1 CAPSULE BY MOUTH EVERY DAY 30 capsule 2  . zolpidem (AMBIEN) 10 MG tablet Take 1 tablet (10 mg total) by mouth at bedtime as needed. for sleep 30 tablet 0   No current facility-administered medications for this visit.     Musculoskeletal: Strength & Muscle Tone: within normal limits Gait & Station:  normal Patient leans: N/A  Psychiatric Specialty Exam: Review of Systems  Psychiatric/Behavioral: Positive for sleep disturbance. The patient is nervous/anxious.   All other systems reviewed and are negative.   There were no vitals taken for this visit.There is no height or weight on file to calculate BMI.  General Appearance: Casual, Neat and Well Groomed  Eye Contact:  Good  Speech:  Clear and Coherent  Volume:  Normal  Mood:  Anxious  Affect:  Appropriate and Congruent  Thought Process:  Goal Directed  Orientation:  Full (Time, Place, and Person)  Thought Content: Rumination   Suicidal Thoughts:  No  Homicidal Thoughts:  No  Memory:  Immediate;   Good Recent;   Good Remote;   Good  Judgement:  Good  Insight:  Good  Psychomotor Activity:  Normal  Concentration:  Concentration: Good and Attention Span: Good  Recall:  Good  Fund of Knowledge: Good  Language: Good  Akathisia:  No  Handed:  Right  AIMS (if indicated): not done  Assets:  Communication Skills Desire for Improvement Physical Health Resilience Social Support Talents/Skills  ADL's:  Intact  Cognition: WNL  Sleep:  Poor   Screenings:   Assessment and Plan: This patient is a 39 year old female with a history of depression and anxiety.  She is much more anxious since the incident happened with her daughter as outlined above.  She will continue Effexor XR 150 mg daily for depression clonazepam 1 mg 4 times daily for anxiety.  We will try trazodone 100 mg at bedtime for sleep but I will  still send in the Ambien 10 mg at bedtime for sleep in case the trazodone does not work.  She will return to see me in 4 weeks and we will arrange for her to see a therapist here.   Tina Ruder, MD 03/13/2020, 10:08 AM

## 2020-03-14 ENCOUNTER — Telehealth (HOSPITAL_COMMUNITY): Payer: Self-pay | Admitting: Psychiatry

## 2020-03-14 NOTE — Telephone Encounter (Signed)
Called to schedule therapy appt patient advised she would call back shortly to schedule

## 2020-04-04 ENCOUNTER — Other Ambulatory Visit (HOSPITAL_COMMUNITY): Payer: Self-pay | Admitting: Psychiatry

## 2020-04-07 DIAGNOSIS — Z20828 Contact with and (suspected) exposure to other viral communicable diseases: Secondary | ICD-10-CM | POA: Diagnosis not present

## 2020-04-11 ENCOUNTER — Other Ambulatory Visit (HOSPITAL_COMMUNITY): Payer: Self-pay | Admitting: Psychiatry

## 2020-05-05 DIAGNOSIS — H5213 Myopia, bilateral: Secondary | ICD-10-CM | POA: Diagnosis not present

## 2020-05-11 ENCOUNTER — Other Ambulatory Visit: Payer: Self-pay

## 2020-05-11 ENCOUNTER — Telehealth (INDEPENDENT_AMBULATORY_CARE_PROVIDER_SITE_OTHER): Payer: Medicaid Other | Admitting: Psychiatry

## 2020-05-11 ENCOUNTER — Encounter (HOSPITAL_COMMUNITY): Payer: Self-pay | Admitting: Psychiatry

## 2020-05-11 DIAGNOSIS — F331 Major depressive disorder, recurrent, moderate: Secondary | ICD-10-CM

## 2020-05-11 MED ORDER — ALPRAZOLAM 1 MG PO TABS
1.0000 mg | ORAL_TABLET | Freq: Four times a day (QID) | ORAL | 2 refills | Status: DC
Start: 2020-05-11 — End: 2020-06-22

## 2020-05-11 MED ORDER — VENLAFAXINE HCL ER 75 MG PO CP24
75.0000 mg | ORAL_CAPSULE | Freq: Every day | ORAL | 2 refills | Status: DC
Start: 2020-05-11 — End: 2020-06-22

## 2020-05-11 MED ORDER — BUPROPION HCL ER (XL) 150 MG PO TB24
150.0000 mg | ORAL_TABLET | ORAL | 2 refills | Status: DC
Start: 2020-05-11 — End: 2020-06-05

## 2020-05-11 MED ORDER — TRAZODONE HCL 100 MG PO TABS: ORAL_TABLET | ORAL | 1 refills | Status: DC

## 2020-05-11 NOTE — Progress Notes (Signed)
Virtual Visit via Video Note  I connected with Tina Weber on 05/11/20 at  1:20 PM EDT by a video enabled telemedicine application and verified that I am speaking with the correct person using two identifiers.   I discussed the limitations of evaluation and management by telemedicine and the availability of in person appointments. The patient expressed understanding and agreed to proceed.    I discussed the assessment and treatment plan with the patient. The patient was provided an opportunity to ask questions and all were answered. The patient agreed with the plan and demonstrated an understanding of the instructions.   The patient was advised to call back or seek an in-person evaluation if the symptoms worsen or if the condition fails to improve as anticipated.  I provided 15 minutes of non-face-to-face time during this encounter. Location: Provider office, patient home  Tina Ruder, MD  [facility] MD/PA/NP OP Progress Note  05/11/2020 1:49 PM Tina Weber  MRN:  [id number]  Chief Complaint:  Chief Complaint    Anxiety; Depression; Follow-up     HPI: This patient is a 39 year old divorced white female who lives with her 56-year-old daughter in [place]. 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  The patient returns for follow-up after 2 months.  She states that she and her husband have gone to court preliminarily regarding child visitation.  She has primary custody of her daughter but her husband is now allowed to have her 1 week and the month.  The patient is very upset and anxious about this because over the summer she got a severe dog bite when she was in his care.  The patient is still going through a difficult custody battle with him.  She is sleeping better with the trazodone but thinks the Effexor XR is making her much more anxious and she would like to get off it.  In reviewing of her medications she thinks she  had the best response to Wellbutrin.  I explained that we will have to get off the Effexor XR slowly over the course of perhaps 8 weeks and she agrees.  She also does not think the clonazepam is helping her anxiety that much and would like to go back to Xanax. Visit Diagnosis:    ICD-10-CM   1. Moderate episode of recurrent major depressive disorder (HCC)  F33.1     Past Psychiatric History: The patient has a history of depression in high school and postpartum depression after the birth of her daughter which responded to Prozac  Past Medical History:  Past Medical History:  Diagnosis Date  . Anxiety   . Depression   . Migraines   . Seizures (HCC)    Last seizure almost one year ago  . Supervision of normal pregnancy in first trimester 07/19/2014    [facility] brittini brubeck 39 yo wm 1st Dating By LMP and [place] Pap  07/19/14 GC/CT Initial:                36+wks: Genetic Screen NT/IT:  CF screen  Anatomic [place]  Flu vaccine  Tdap Recommended ~ 28wks Glucose Screen  2 hr GBS  Feed Preference  Contraception  Circumcision  Childbirth Classes  Pediatrician      Past Surgical History:  Procedure Laterality Date  . CHOLECYSTECTOMY    . WISDOM TOOTH EXTRACTION      Family Psychiatric History: see below  Family History:  Family History  Problem Relation Age of Onset  . Congestive Heart Failure Maternal Grandmother   . Diabetes Maternal Grandfather   . Hypertension Father   . Anxiety disorder Father   . Cancer Mother        thyroid  . Anxiety disorder Mother     Social History:  Social History   Socioeconomic History  . Marital status: Married    Spouse name: Not on file  . Number of children: Not on file  . Years of education: Not on file  . Highest education level: Not on file  Occupational History  . Not on file  Tobacco Use  . Smoking status: Never Smoker  . Smokeless tobacco: Never Used  Substance and Sexual Activity  . Alcohol use: No    Comment: 01-05-16 per pt  no   . Drug use: No    Comment: 01-05-16 per pt no  . Sexual activity: Never    Birth control/protection: None  Other Topics Concern  . Not on file  Social History Narrative  . Not on file   Social Determinants of Health   Financial Resource Strain:   . Difficulty of Paying Living Expenses: Not on file  Food Insecurity:   . Worried About Programme researcher, broadcasting/film/video in the Last Year: Not on file  . Ran Out of Food in the Last Year: Not on file  Transportation Needs:   . Lack of Transportation (Medical): Not on file  . Lack of Transportation (Non-Medical): Not on file  Physical Activity:   . Days of Exercise per Week: Not on file  . Minutes of Exercise per Session: Not on file  Stress:   . Feeling of Stress : Not on file  Social Connections:   . Frequency of Communication with Friends and Family: Not on file  . Frequency of Social Gatherings with Friends and Family: Not on file  . Attends Religious Services: Not on file  . Active Member of Clubs or Organizations: Not on file  . Attends Banker Meetings: Not on file  . Marital Status: Not on file    Allergies: No Known Allergies  Metabolic Disorder Labs: No results found for: HGBA1C, MPG No results found for: PROLACTIN No results found for: CHOL, TRIG, HDL, CHOLHDL, VLDL, LDLCALC Lab Results  Component Value Date   TSH 0.674 07/19/2014    Therapeutic Level Labs: No results found for: LITHIUM No results found for: VALPROATE No components found for:  CBMZ  Current Medications: Current Outpatient Medications  Medication Sig Dispense Refill  . ALPRAZolam (XANAX) 1 MG tablet Take 1 tablet (1 mg total) by mouth in the morning, at noon, in the evening, and at bedtime. 120 tablet 2  . buPROPion (WELLBUTRIN XL) 150 MG 24 hr tablet Take 1 tablet (150 mg total) by mouth every morning. 30 tablet 2  . PARAGARD INTRAUTERINE COPPER IU by Intrauterine route.    . traZODone (DESYREL) 100 MG tablet TAKE 1 TABLET BY MOUTH  EVERYDAY AT BEDTIME 90 tablet 1  . venlafaxine XR (EFFEXOR XR) 75 MG 24 hr capsule Take 1 capsule (75 mg total) by mouth daily. 30 capsule 2   No current facility-administered medications for this visit.     Musculoskeletal: Strength & Muscle Tone: within normal limits Gait & Station: normal Patient leans: N/A  Psychiatric Specialty Exam: Review of Systems  Psychiatric/Behavioral: Positive for dysphoric mood. The patient is nervous/anxious.   All other systems reviewed and are negative.  There were no vitals taken for this visit.There is no height or weight on file to calculate BMI.  General Appearance: Casual and Fairly Groomed  Eye Contact:  Good  Speech:  Clear and Coherent  Volume:  Normal  Mood:  Anxious  Affect:  Tearful  Thought Process:  Goal Directed  Orientation:  Full (Time, Place, and Person)  Thought Content: Rumination   Suicidal Thoughts:  No  Homicidal Thoughts:  No  Memory:  Immediate;   Good Recent;   Good Remote;   Good  Judgement:  Good  Insight:  Good  Psychomotor Activity:  EPS  Concentration:  Concentration: Good and Attention Span: Good  Recall:  Good  Fund of Knowledge: Good  Language: Good  Akathisia:  No  Handed:  Right  AIMS (if indicated): not done  Assets:  Communication Skills Desire for Improvement Physical Health Resilience Social Support Talents/Skills  ADL's:  Intact  Cognition: WNL  Sleep:  Good   Screenings:   Assessment and Plan: This patient is a 38 year old female with a history of depression anxiety.  She does not feel that the Effexor XR is really helping her much.  We will taper it down to 75 mg for this month and start Wellbutrin XL 150 mg for depression.  She will also switch clonazepam to Xanax 1 mg 4 times daily for anxiety.  She is sleeping better on trazodone 100 mg at bedtime.  She will return to see me in 4 weeks and we will arrange for her to see a therapist here   Tina Ruder, MD 05/11/2020, 1:49 PM

## 2020-06-04 ENCOUNTER — Other Ambulatory Visit (HOSPITAL_COMMUNITY): Payer: Self-pay | Admitting: Psychiatry

## 2020-06-19 ENCOUNTER — Telehealth (HOSPITAL_COMMUNITY): Payer: Self-pay | Admitting: Psychiatry

## 2020-06-19 NOTE — Telephone Encounter (Signed)
Called to schedule f/u appt, left vm 

## 2020-06-21 ENCOUNTER — Telehealth (HOSPITAL_COMMUNITY): Payer: Medicaid Other | Admitting: Psychiatry

## 2020-06-21 ENCOUNTER — Other Ambulatory Visit: Payer: Self-pay

## 2020-06-22 ENCOUNTER — Telehealth (INDEPENDENT_AMBULATORY_CARE_PROVIDER_SITE_OTHER): Payer: Medicaid Other | Admitting: Psychiatry

## 2020-06-22 ENCOUNTER — Encounter (HOSPITAL_COMMUNITY): Payer: Self-pay | Admitting: Psychiatry

## 2020-06-22 ENCOUNTER — Other Ambulatory Visit: Payer: Self-pay

## 2020-06-22 DIAGNOSIS — F331 Major depressive disorder, recurrent, moderate: Secondary | ICD-10-CM

## 2020-06-22 MED ORDER — TRAZODONE HCL 150 MG PO TABS
150.0000 mg | ORAL_TABLET | Freq: Every day | ORAL | 2 refills | Status: DC
Start: 2020-06-22 — End: 2020-07-21

## 2020-06-22 MED ORDER — ALPRAZOLAM 1 MG PO TABS: 1.0000 mg | ORAL_TABLET | Freq: Four times a day (QID) | ORAL | 2 refills | Status: DC

## 2020-06-22 MED ORDER — VENLAFAXINE HCL ER 75 MG PO CP24: 75.0000 mg | ORAL_CAPSULE | Freq: Every day | ORAL | 2 refills | Status: DC

## 2020-06-22 MED ORDER — TRAZODONE HCL 150 MG PO TABS: 150.0000 mg | ORAL_TABLET | Freq: Every day | ORAL | 2 refills | Status: DC

## 2020-06-22 MED ORDER — BUPROPION HCL ER (XL) 300 MG PO TB24: 300.0000 mg | ORAL_TABLET | ORAL | 2 refills | Status: DC
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
## 2020-06-26 ENCOUNTER — Other Ambulatory Visit: Payer: Self-pay

## 2020-06-26 ENCOUNTER — Ambulatory Visit (INDEPENDENT_AMBULATORY_CARE_PROVIDER_SITE_OTHER): Payer: Medicaid Other | Admitting: Clinical

## 2020-06-26 DIAGNOSIS — F331 Major depressive disorder, recurrent, moderate: Secondary | ICD-10-CM

## 2020-06-26 DIAGNOSIS — F411 Generalized anxiety disorder: Secondary | ICD-10-CM
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
## 2020-07-17 ENCOUNTER — Other Ambulatory Visit: Payer: Self-pay

## 2020-07-17 ENCOUNTER — Ambulatory Visit (INDEPENDENT_AMBULATORY_CARE_PROVIDER_SITE_OTHER): Payer: Medicaid Other | Admitting: Clinical

## 2020-07-17 DIAGNOSIS — F411 Generalized anxiety disorder: Secondary | ICD-10-CM | POA: Diagnosis not present

## 2020-07-17 DIAGNOSIS — F331 Major depressive disorder, recurrent, moderate: Secondary | ICD-10-CM

## 2020-07-17 NOTE — Progress Notes (Signed)
Virtual Visit via Video Note  I connected with Tina Weber on 07/17/20 at  9:00 AM EST by a video enabled telemedicine application and verified that I am speaking with the correct person using two identifiers.  Location: Patient: Home Provider: Office   I discussed the limitations of evaluation and management by telemedicine and the availability of in person appointments. The patient expressed understanding and agreed to proceed.      THERAPIST PROGRESS NOTE  Session Time:9:00AM-9:55AM  Participation Level:Active  Behavioral Response:CasualAlertAnxieous  Type of Therapy:Individual Therapy  Treatment Goals addressed:Anger and Coping  Interventions:CBT, Motivational Interviewing, Solution Focused and Strength-based  Summary:Tina D. Allenis a 39 y.o.femalewho presents with Depression and Anxiety.The OPT therapist worked with thepatientfor herinitial OPT treatment. The OPT therapist utilized Motivational Interviewing to assist in creating therapeutic repore. The patient in the session was engaged and work in Tour manager about hertriggers and symptoms over the past few weeksincluding recent holiday induced anxiety and interactions with family members..The OPT therapist utilized Cognitive Behavioral Therapy through cognitive restructuring as well as worked with the patient on coping strategies to assist in management ofmood and anxiety.The OPT therapist worked with the patient providing support and psycho-education.  Suicidal/Homicidal:Nowithout intent/plan  Therapist Response:The OPT therapist worked with the patient for the patients scheduled session. The patient was engaged in hersession and gave feedback in relation to triggers, symptoms, and behavior responses over the pastfewweeks. The OPT therapist worked with the patient utilizing an in session Cognitive Behavioral Therapy exercise. The patient was responsive in the session  and verbalized, "Ijust need to set boundaries better and work on my on confidence".The OPT therapist gave support and worked with the patient on communication with her family members. The OPT therapist will continue treatment work with the patient in her next scheduled session Plan: Return again in2/3weeks.  Diagnosis:Axis I:Moderate episode of recurrent major depressive disorder, Generalized Anxiety  Axis II:No diagnosis  I discussed the assessment and treatment plan with the patient. The patient was provided an opportunity to ask questions and all were answered. The patient agreed with the plan and demonstrated an understanding of the instructions.  The patient was advised to call back or seek an in-person evaluation if the symptoms worsen or if the condition fails to improve as anticipated.  I provided59minutes of non-face-to-face time during this encounter.  Tina Burn, LCSW 07/17/2020

## 2020-07-18 DIAGNOSIS — H5203 Hypermetropia, bilateral: Secondary | ICD-10-CM | POA: Diagnosis not present

## 2020-07-18 DIAGNOSIS — H52223 Regular astigmatism, bilateral: Secondary | ICD-10-CM | POA: Diagnosis not present

## 2020-07-21 ENCOUNTER — Other Ambulatory Visit: Payer: Self-pay

## 2020-07-21 ENCOUNTER — Telehealth (INDEPENDENT_AMBULATORY_CARE_PROVIDER_SITE_OTHER): Payer: Medicaid Other | Admitting: Psychiatry

## 2020-07-21 ENCOUNTER — Encounter (HOSPITAL_COMMUNITY): Payer: Self-pay | Admitting: Psychiatry

## 2020-07-21 DIAGNOSIS — F411 Generalized anxiety disorder: Secondary | ICD-10-CM | POA: Diagnosis not present

## 2020-07-21 DIAGNOSIS — F331 Major depressive disorder, recurrent, moderate: Secondary | ICD-10-CM

## 2020-07-21 MED ORDER — ALPRAZOLAM 1 MG PO TABS: 1.0000 mg | ORAL_TABLET | Freq: Four times a day (QID) | ORAL | 2 refills | Status: DC

## 2020-07-21 MED ORDER — FLUOXETINE HCL 20 MG PO CAPS: 20.0000 mg | ORAL_CAPSULE | Freq: Every day | ORAL | 2 refills | Status: DC

## 2020-07-21 MED ORDER — TRAZODONE HCL 150 MG PO TABS
150.0000 mg | ORAL_TABLET | Freq: Every day | ORAL | 2 refills | Status: DC
Start: 2020-07-21 — End: 2020-09-08

## 2020-07-21 NOTE — Progress Notes (Signed)
Virtual Visit via Video Note  I connected with Tina Weber on 07/21/20 at 10:00 AM EST by a video enabled telemedicine application and verified that I am speaking with the correct person using two identifiers.  Location: Patient:work Provider: home   I discussed the limitations of evaluation and management by telemedicine and the availability of in person appointments. The patient expressed understanding and agreed to proceed.     I discussed the assessment and treatment plan with the patient. The patient was provided an opportunity to ask questions and all were answered. The patient agreed with the plan and demonstrated an understanding of the instructions.   The patient was advised to call back or seek an in-person evaluation if the symptoms worsen or if the condition fails to improve as anticipated.  I provided 15 minutes of non-face-to-face time during this encounter.   [name], MD  [facility] MD/PA/NP OP Progress Note  07/21/2020 10:27 AM Tina Weber  MRN:  [id number]  Chief Complaint:  Chief Complaint    Depression; Anxiety; Follow-up     HPI: This patient is a 39 year old divorced white female who lives with her68-year-old daughter in [place]. 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  The patient returns for follow-up after 4 weeks.  She is now totally off the Effexor and is taking Wellbutrin XL 300 mg.  She still does not feel much better.  She is having "terrible nightmares" from the medication.  She states that she is decided to break-up with her current boyfriend.  She has been with them for a year and they are still not communicating very well and he is very quiet and does not express feelings.  She thinks is just causing more stress for her.  She feels like she is "putting up a front" everywhere she goes, trying to seem happy when she is not.  She does well at work and with her daughter but when  she is alone she just wants to cry.  She states in the past she had tried Prozac but not for very long and would like to retry it I think this is reasonable.  The Xanax is helping her anxiety.  For the most part she is sleeping although she sometimes wakes up at night and I urged her to take a Xanax if she wakes up.  She denies suicidal ideation and she has started therapy. Visit Diagnosis:    ICD-10-CM   1. Moderate episode of recurrent major depressive disorder (HCC)  F33.1   2. Generalized anxiety disorder  F41.1     Past Psychiatric History: The patient has a history of depression in high school and postpartum depression after the birth of her daughter which responded to Prozac  Past Medical History:  Past Medical History:  Diagnosis Date  . Anxiety   . Depression   . Migraines   . Seizures (HCC)    Last seizure almost one year ago  . Supervision of normal pregnancy in first trimester 07/19/2014    [facility] F[facility] taraya steward 39 yo wm 1st Dating By LMP and [place] Pap  07/19/14 GC/CT Initial:                36+wks: Genetic Screen NT/IT:  CF screen  Anatomic [place]  Flu vaccine  Tdap Recommended ~ 28wks Glucose Screen  2 hr GBS  Feed Preference  Contraception  Circumcision  Childbirth  Classes  Pediatrician      Past Surgical History:  Procedure Laterality Date  . CHOLECYSTECTOMY    . WISDOM TOOTH EXTRACTION      Family Psychiatric History: see below  Family History:  Family History  Problem Relation Age of Onset  . Congestive Heart Failure Maternal Grandmother   . Diabetes Maternal Grandfather   . Hypertension Father   . Anxiety disorder Father   . Cancer Mother        thyroid  . Anxiety disorder Mother     Social History:  Social History   Socioeconomic History  . Marital status: Married    Spouse name: Not on file  . Number of children: Not on file  . Years of education: Not on file  . Highest education level: Not on file  Occupational History  . Not on file   Tobacco Use  . Smoking status: Never Smoker  . Smokeless tobacco: Never Used  Substance and Sexual Activity  . Alcohol use: No    Comment: 01-05-16 per pt no   . Drug use: No    Comment: 01-05-16 per pt no  . Sexual activity: Never    Birth control/protection: None  Other Topics Concern  . Not on file  Social History Narrative  . Not on file   Social Determinants of Health   Financial Resource Strain:   . Difficulty of Paying Living Expenses: Not on file  Food Insecurity:   . Worried About Programme researcher, broadcasting/film/video in the Last Year: Not on file  . Ran Out of Food in the Last Year: Not on file  Transportation Needs:   . Lack of Transportation (Medical): Not on file  . Lack of Transportation (Non-Medical): Not on file  Physical Activity:   . Days of Exercise per Week: Not on file  . Minutes of Exercise per Session: Not on file  Stress:   . Feeling of Stress : Not on file  Social Connections:   . Frequency of Communication with Friends and Family: Not on file  . Frequency of Social Gatherings with Friends and Family: Not on file  . Attends Religious Services: Not on file  . Active Member of Clubs or Organizations: Not on file  . Attends Banker Meetings: Not on file  . Marital Status: Not on file    Allergies: No Known Allergies  Metabolic Disorder Labs: No results found for: HGBA1C, MPG No results found for: PROLACTIN No results found for: CHOL, TRIG, HDL, CHOLHDL, VLDL, LDLCALC Lab Results  Component Value Date   TSH 0.674 07/19/2014    Therapeutic Level Labs: No results found for: LITHIUM No results found for: VALPROATE No components found for:  CBMZ  Current Medications: Current Outpatient Medications  Medication Sig Dispense Refill  . ALPRAZolam (XANAX) 1 MG tablet Take 1 tablet (1 mg total) by mouth in the morning, at noon, in the evening, and at bedtime. 120 tablet 2  . FLUoxetine (PROZAC) 20 MG capsule Take 1 capsule (20 mg total) by mouth daily.  30 capsule 2  . PARAGARD INTRAUTERINE COPPER IU by Intrauterine route.    . traZODone (DESYREL) 150 MG tablet Take 1 tablet (150 mg total) by mouth at bedtime. 30 tablet 2   No current facility-administered medications for this visit.     Musculoskeletal: Strength & Muscle Tone: within normal limits Gait & Station: normal Patient leans: N/A  Psychiatric Specialty Exam: Review of Systems  Psychiatric/Behavioral: Positive for dysphoric mood. The patient  is nervous/anxious.   All other systems reviewed and are negative.   There were no vitals taken for this visit.There is no height or weight on file to calculate BMI.  General Appearance: Casual and Fairly Groomed  Eye Contact:  Good  Speech:  Clear and Coherent  Volume:  Normal  Mood:  Anxious and Depressed  Affect:  Depressed and Tearful  Thought Process:  Goal Directed  Orientation:  Full (Time, Place, and Person)  Thought Content: Rumination   Suicidal Thoughts:  No  Homicidal Thoughts:  No  Memory:  Immediate;   Good Recent;   Good Remote;   Fair  Judgement:  Good  Insight:  Good  Psychomotor Activity:  Normal  Concentration:  Concentration: Good and Attention Span: Good  Recall:  Good  Fund of Knowledge: Good  Language: Good  Akathisia:  No  Handed:  Right  AIMS (if indicated): not done  Assets:  Communication Skills Desire for Improvement Physical Health Resilience Social Support Talents/Skills  ADL's:  Intact  Cognition: WNL  Sleep:  Fair   Screenings:   Assessment and Plan: This patient is a 39 year old female with a history of depression and anxiety.  The Xanax 1 mg up to 4 times daily is helpful and will be continued.  Since Wellbutrin is not helping we will discontinue it in favor of Prozac 20 mg daily.  She will continue trazodone 150 mg at bedtime for sleep.  She will return to see me in 4 weeks   [name], MD 07/21/2020, 10:27 AM

## 2020-07-31 ENCOUNTER — Ambulatory Visit (INDEPENDENT_AMBULATORY_CARE_PROVIDER_SITE_OTHER): Payer: Medicaid Other | Admitting: Clinical

## 2020-07-31 ENCOUNTER — Other Ambulatory Visit: Payer: Self-pay

## 2020-07-31 DIAGNOSIS — F411 Generalized anxiety disorder: Secondary | ICD-10-CM

## 2020-07-31 DIAGNOSIS — F331 Major depressive disorder, recurrent, moderate: Secondary | ICD-10-CM | POA: Diagnosis not present

## 2020-07-31 NOTE — Progress Notes (Signed)
°  Virtual Visit via Video Note  I connected withJennifer D Weber on 07/31/20 at  10:00 AM EST by a video enabled telemedicine application and verified that I am speaking with the correct person using two identifiers.  Location: Patient: Home Provider: Office  I discussed the limitations of evaluation and management by telemedicine and the availability of in person appointments. The patient expressed understanding and agreed to proceed.     THERAPIST PROGRESS NOTE  Session Time:10:00AM-10:55AM  Participation Level:Active  Behavioral Response:CasualAlertAnxieous  Type of Therapy:Individual Therapy  Treatment Goals addressed:Anger and Coping  Interventions:CBT, Motivational Interviewing, Solution Focused and Strength-based  Summary:Tina D. Allenis a 39 y.o.femalewho presents with Depression and Anxiety.The OPT therapist worked with thepatientfor herinitialOPT treatment. The OPT therapist utilized Motivational Interviewing to assist in creating therapeutic repore. The patient in the session was engaged and work in Tour manager about hertriggers and symptoms over the past few weeksincluding anxiety around setting boundaries with family members..The OPT therapist utilized Cognitive Behavioral Therapy through cognitive restructuring as well as worked with the patient on coping strategies to assist in management ofmood and anxiety.The OPT therapist worked with the patient providing support and psycho-education.The patient noted she was able to successfully talk with her aunt ans set a boundary since her last session.  Suicidal/Homicidal:Nowithout intent/plan  Therapist Response:The OPT therapist worked with the patient for the patients scheduled session. The patient was engaged in hersession and gave feedback in relation to triggers, symptoms, and behavior responses over the pastfewweeks. The OPT therapist worked with the patient  utilizing an in session Cognitive Behavioral Therapy exercise. The patient was responsive in the session and verbalized, "Ijust want to try to set a boundry with my cousin and my Mother without hurting their feelings".The OPT therapist gave support and worked with the patient on communication with her family members. The OPT therapist will continue treatment work with the patient in her next scheduled session  Plan: Return again in2/3weeks.  Diagnosis:Axis I:Moderate episode of recurrent major depressive disorder, Generalized Anxiety  Axis II:No diagnosis  I discussed the assessment and treatment plan with the patient. The patient was provided an opportunity to ask questions and all were answered. The patient agreed with the plan and demonstrated an understanding of the instructions.  The patient was advised to call back or seek an in-person evaluation if the symptoms worsen or if the condition fails to improve as anticipated.  I provided2minutes of non-face-to-face time during this encounter.  [name], LCSW 07/31/2020

## 2020-08-07 ENCOUNTER — Other Ambulatory Visit (HOSPITAL_COMMUNITY): Payer: Self-pay | Admitting: Psychiatry

## 2020-08-23 ENCOUNTER — Other Ambulatory Visit: Payer: Self-pay

## 2020-08-23 ENCOUNTER — Ambulatory Visit (INDEPENDENT_AMBULATORY_CARE_PROVIDER_SITE_OTHER): Payer: Medicaid Other | Admitting: Clinical

## 2020-08-23 DIAGNOSIS — F331 Major depressive disorder, recurrent, moderate: Secondary | ICD-10-CM | POA: Diagnosis not present

## 2020-08-23 DIAGNOSIS — F411 Generalized anxiety disorder: Secondary | ICD-10-CM

## 2020-08-23 NOTE — Progress Notes (Signed)
  Virtual Visit via Video Note  I connected withJennifer D Allenon 1/5/22at 8:00 AM ESTby a video enabled telemedicine application and verified that I am speaking with the correct person using two identifiers.  Location: Patient:Home Provider:Office  I discussed the limitations of evaluation and management by telemedicine and the availability of in person appointments. The patient expressed understanding and agreed to proceed.  THERAPIST PROGRESS NOTE  Session Time:8:00AM-8:45AM  Participation Level:Active  Behavioral Response:CasualAlertAnxieous  Type of Therapy:Individual Therapy  Treatment Goals addressed:Anger and Coping  Interventions:CBT, Motivational Interviewing, Solution Focused and Strength-based  Summary:Tina D. Allenis [id number].o.femalewho presents withDepression and Anxiety.The OPT therapist worked with thepatientfor herinitialOPT treatment. The OPT therapist utilized Motivational Interviewing to assist in creating therapeutic repore. The patient in the session was engaged and work in Tour manager about hertriggers and symptoms over the past few weeksincluding anxiety triggered by her Mother showing up randomly to visit and staying for 5 days.The OPT therapist utilized Cognitive Behavioral Therapy through cognitive restructuring as well as worked with the patient on coping strategies to assist in management ofmoodand anxiety.The OPT therapist worked with the patient providing support andpsycho-education.  Suicidal/Homicidal:Nowithout intent/plan  Therapist Response:The OPT therapist worked with the patient for the patients scheduled session. The patient was engaged in hersession and gave feedback in relation to triggers, symptoms, and behavior responses over the pastfewweeks. The OPT therapist worked with the patient utilizing an in session Cognitive Behavioral Therapy exercise. The patient was  responsive in the session and verbalized, "Tina Weber I get guilt tripped into agreeing to things that trigger my Anxiety and I am going to work harder at putting into place boundaries with my family".The OPT therapistgave support and worked with the patient on communication with her family members and the importance of setting boundaries to limit her triggering. The OPT therapist will continue treatment work with the patient in her next scheduled session  Plan: Return again in2/3weeks.  Diagnosis:Axis I:Moderate episode of recurrent major depressive disorder, Generalized Anxiety  Axis II:No diagnosis  I discussed the assessment and treatment plan with the patient. The patient was provided an opportunity to ask questions and all were answered. The patient agreed with the plan and demonstrated an understanding of the instructions.  The patient was advised to call back or seek an in-person evaluation if the symptoms worsen or if the condition fails to improve as anticipated.  I provided26minutes of non-face-to-face time during this encounter.  [name], LCSW 08/23/2020

## 2020-09-06 ENCOUNTER — Ambulatory Visit (HOSPITAL_COMMUNITY): Payer: Medicaid Other | Admitting: Clinical

## 2020-09-08 ENCOUNTER — Other Ambulatory Visit: Payer: Self-pay

## 2020-09-08 ENCOUNTER — Encounter (HOSPITAL_COMMUNITY): Payer: Self-pay | Admitting: Psychiatry

## 2020-09-08 ENCOUNTER — Telehealth (INDEPENDENT_AMBULATORY_CARE_PROVIDER_SITE_OTHER): Payer: Medicaid Other | Admitting: Psychiatry

## 2020-09-08 DIAGNOSIS — F331 Major depressive disorder, recurrent, moderate: Secondary | ICD-10-CM

## 2020-09-08 DIAGNOSIS — F411 Generalized anxiety disorder: Secondary | ICD-10-CM | POA: Diagnosis not present

## 2020-09-08 MED ORDER — FLUOXETINE HCL 20 MG PO CAPS: 20.0000 mg | ORAL_CAPSULE | Freq: Every day | ORAL | 2 refills | Status: DC

## 2020-09-08 MED ORDER — ALPRAZOLAM 1 MG PO TABS: 1.0000 mg | ORAL_TABLET | Freq: Four times a day (QID) | ORAL | 2 refills | Status: DC

## 2020-09-08 MED ORDER — TRAZODONE HCL 150 MG PO TABS
150.0000 mg | ORAL_TABLET | Freq: Every day | ORAL | 2 refills | Status: DC
Start: 2020-09-08 — End: 2020-11-06

## 2020-09-08 NOTE — Progress Notes (Signed)
Virtual Visit via Video Note  I connected with Tina Weber on 09/08/20 at  8:40 AM EST by a video enabled telemedicine application and verified that I am speaking with the correct person using two identifiers.  Location: Patient: home Provider: home   I discussed the limitations of evaluation and management by telemedicine and the availability of in person appointments. The patient expressed understanding and agreed to proceed.   I discussed the assessment and treatment plan with the patient. The patient was provided an opportunity to ask questions and all were answered. The patient agreed with the plan and demonstrated an understanding of the instructions.   The patient was advised to call back or seek an in-person evaluation if the symptoms worsen or if the condition fails to improve as anticipated.  I provided 15 minutes of non-face-to-face time during this encounter.   [name], MD  [facility] MD/PA/NP OP Progress Note  09/08/2020 8:54 AM Tina Weber  MRN:  [id number]  Chief Complaint:  Chief Complaint    Anxiety; Depression; Follow-up     HPI: This patient is a 40 year old divorced white female who lives with her12-year-old daughter in [place]. 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  The patient returns for follow-up after 6 weeks.  Last time she was having nightmares with the Wellbutrin was changed to Prozac.  She is feeling much better now.  Also she thinks her therapy has helped.  She did not break-up with her boyfriend.  He was very supportive during the worst times and she now appreciates this.  Things have gotten resolved with her ex-husband in terms of visitation and custody.  She has full custody and he has visitation every other weekend and she is quite happy with this.  Her mood seems to be good her affect is bright today and she is sleeping well with the trazodone plus melatonin. Visit  Diagnosis:    ICD-10-CM   1. Moderate episode of recurrent major depressive disorder (HCC)  F33.1   2. Generalized anxiety disorder  F41.1     Past Psychiatric History: The patient has a history of depression in high school and postpartum depression after the birth of her daughter which responded to Prozac  Past Medical History:  Past Medical History:  Diagnosis Date   Anxiety    Depression    Migraines    Seizures (HCC)    Last seizure almost one year ago   Supervision of normal pregnancy in first trimester 07/19/2014    [facility] F[facility] kaylor maiers 40 yo wm 1st Dating By LMP and [place] Pap  07/19/14 GC/CT Initial:                36+wks: Genetic Screen NT/IT:  CF screen  Anatomic [place]  Flu vaccine  Tdap Recommended ~ 28wks Glucose Screen  2 hr GBS  Feed Preference  Contraception  Circumcision  Childbirth Classes  Pediatrician      Past Surgical History:  Procedure Laterality Date   CHOLECYSTECTOMY     WISDOM TOOTH EXTRACTION      Family Psychiatric History: see below  Family History:  Family History  Problem Relation Age of Onset   Congestive Heart Failure Maternal Grandmother    Diabetes Maternal Grandfather    Hypertension Father    Anxiety disorder Father    Cancer Mother        thyroid   Anxiety  disorder Mother     Social History:  Social History   Socioeconomic History   Marital status: Married    Spouse name: Not on file   Number of children: Not on file   Years of education: Not on file   Highest education level: Not on file  Occupational History   Not on file  Tobacco Use   Smoking status: Never Smoker   Smokeless tobacco: Never Used  Substance and Sexual Activity   Alcohol use: No    Comment: 01-05-16 per pt no    Drug use: No    Comment: 01-05-16 per pt no   Sexual activity: Never    Birth control/protection: None  Other Topics Concern   Not on file  Social History Narrative   Not on file   Social Determinants of  Health   Financial Resource Strain: Not on file  Food Insecurity: Not on file  Transportation Needs: Not on file  Physical Activity: Not on file  Stress: Not on file  Social Connections: Not on file    Allergies: No Known Allergies  Metabolic Disorder Labs: No results found for: HGBA1C, MPG No results found for: PROLACTIN No results found for: CHOL, TRIG, HDL, CHOLHDL, VLDL, LDLCALC Lab Results  Component Value Date   TSH 0.674 07/19/2014    Therapeutic Level Labs: No results found for: LITHIUM No results found for: VALPROATE No components found for:  CBMZ  Current Medications: Current Outpatient Medications  Medication Sig Dispense Refill   ALPRAZolam (XANAX) 1 MG tablet Take 1 tablet (1 mg total) by mouth in the morning, at noon, in the evening, and at bedtime. 120 tablet 2   FLUoxetine (PROZAC) 20 MG capsule Take 1 capsule (20 mg total) by mouth daily. 30 capsule 2   PARAGARD INTRAUTERINE COPPER IU by Intrauterine route.     traZODone (DESYREL) 150 MG tablet Take 1 tablet (150 mg total) by mouth at bedtime. 30 tablet 2   No current facility-administered medications for this visit.     Musculoskeletal: Strength & Muscle Tone: within normal limits Gait & Station: normal Patient leans: N/A  Psychiatric Specialty Exam: Review of Systems  All other systems reviewed and are negative.   There were no vitals taken for this visit.There is no height or weight on file to calculate BMI.  General Appearance: Casual and Fairly Groomed  Eye Contact:  Good  Speech:  Clear and Coherent  Volume:  Normal  Mood:  Euthymic  Affect:  Appropriate and Congruent  Thought Process:  Goal Directed  Orientation:  Full (Time, Place, and Person)  Thought Content: WDL   Suicidal Thoughts:  No  Homicidal Thoughts:  No  Memory:  Immediate;   Good Recent;   Good Remote;   Fair  Judgement:  Good  Insight:  Good  Psychomotor Activity:  Normal  Concentration:  Concentration: Good  and Attention Span: Good  Recall:  Good  Fund of Knowledge: Good  Language: Good  Akathisia:  No  Handed:  Right  AIMS (if indicated): not done  Assets:  Communication Skills Desire for Improvement Physical Health Resilience Social Support Talents/Skills Vocational/Educational  ADL's:  Intact  Cognition: WNL  Sleep:  Good   Screenings:   Assessment and Plan: This patient is a 40 year old female with a history of depression and anxiety.  She is doing much better on her current regimen.  She will continue Prozac 20 mg daily for depression, Xanax 1 mg up to 4 times daily for anxiety  and trazodone 150 mg at bedtime for sleep.  She will return to see me in 2 months   [name], MD 09/08/2020, 8:54 AM

## 2020-09-19 ENCOUNTER — Telehealth (HOSPITAL_COMMUNITY): Payer: Self-pay | Admitting: Psychiatry

## 2020-09-19 NOTE — Telephone Encounter (Signed)
Called to schedule f/u appt, left vm 

## 2020-09-21 ENCOUNTER — Ambulatory Visit (INDEPENDENT_AMBULATORY_CARE_PROVIDER_SITE_OTHER): Payer: Medicaid Other | Admitting: Clinical

## 2020-09-21 ENCOUNTER — Other Ambulatory Visit: Payer: Self-pay

## 2020-09-21 DIAGNOSIS — F411 Generalized anxiety disorder: Secondary | ICD-10-CM | POA: Diagnosis not present

## 2020-09-21 DIAGNOSIS — F331 Major depressive disorder, recurrent, moderate: Secondary | ICD-10-CM | POA: Diagnosis not present

## 2020-09-21 NOTE — Progress Notes (Signed)
  Virtual Visit via Video Note  I connected withJennifer D Allenon 2/3/22at8:00 AM ESTby a video enabled telemedicine application and verified that I am speaking with the correct person using two identifiers.  Location: Patient:Home Provider:Office  I discussed the limitations of evaluation and management by telemedicine and the availability of in person appointments. The patient expressed understanding and agreed to proceed.  THERAPIST PROGRESS NOTE  Session Time:8:00AM-8:55AM  Participation Level:Active  Behavioral Response:CasualAlertAnxieous  Type of Therapy:Individual Therapy  Treatment Goals addressed:Anger and Coping  Interventions:CBT, Motivational Interviewing, Solution Focused and Strength-based  Summary:Tina D. Allenis [id number].o.femalewho presents withDepression and Anxiety.The OPT therapist worked with thepatientfor herinitialOPT treatment. The OPT therapist utilized Motivational Interviewing to assist in creating therapeutic repore. The patient in the session was engaged and work in Tour manager about hertriggers and symptoms over the past few weeksincluding recently losing an extended family member unexpectedly.The OPT therapist utilized Cognitive Behavioral Therapy through cognitive restructuring as well as worked with the patient on coping strategies to assist in management ofmoodand anxiety.The OPT therapist worked with the patient providing support andpsycho-education.The OPT therapist encouraged the patient to continue work on self care.  Suicidal/Homicidal:Nowithout intent/plan  Therapist Response:The OPT therapist worked with the patient for the patients scheduled session. The patient was engaged in hersession and gave feedback in relation to triggers, symptoms, and behavior responses over the pastfewweeks. The OPT therapist worked with the patient utilizing an in session Cognitive Behavioral  Therapy exercise. The patient was responsive in the session and verbalized, "I was doing much better and then out of nowhere we lost a family member and he was young only 40yrs old he stood up from the dinner table and just collapsed and died".The OPT therapistgave support and worked with the patient on communication with her family members and the importance of setting boundaries to limit her triggering. The OPT therapist worked with the patient validating her feelings and allowing her to talk about her grief from a family member unexpectedly passing away.The OPT therapist will continue treatment work with the patient in her next scheduled session  Plan: Return again in2/3weeks.  Diagnosis:Axis I:Moderate episode of recurrent major depressive disorder, Generalized Anxiety  Axis II:No diagnosis  I discussed the assessment and treatment plan with the patient. The patient was provided an opportunity to ask questions and all were answered. The patient agreed with the plan and demonstrated an understanding of the instructions.  The patient was advised to call back or seek an in-person evaluation if the symptoms worsen or if the condition fails to improve as anticipated.  I provided61minutes of non-face-to-face time during this encounter.  [name], LCSW 09/21/2020

## 2020-10-10 ENCOUNTER — Ambulatory Visit (INDEPENDENT_AMBULATORY_CARE_PROVIDER_SITE_OTHER): Payer: Medicaid Other | Admitting: Clinical

## 2020-10-10 ENCOUNTER — Other Ambulatory Visit: Payer: Self-pay

## 2020-10-10 DIAGNOSIS — F411 Generalized anxiety disorder: Secondary | ICD-10-CM

## 2020-10-10 DIAGNOSIS — F331 Major depressive disorder, recurrent, moderate: Secondary | ICD-10-CM | POA: Diagnosis not present

## 2020-10-10 NOTE — Progress Notes (Signed)
  Virtual Visit via Video Note  I connected withJennifer D Allenon2/22/22at8:00 AM ESTby a video enabled telemedicine application and verified that I am speaking with the correct person using two identifiers.  Location: Patient:Home Provider:Office  I discussed the limitations of evaluation and management by telemedicine and the availability of in person appointments. The patient expressed understanding and agreed to proceed.  THERAPIST PROGRESS NOTE  Session Time:8:00AM-8:55AM  Participation Level:Active  Behavioral Response:CasualAlertAnxieous  Type of Therapy:Individual Therapy  Treatment Goals addressed:Anger and Coping  Interventions:CBT, Motivational Interviewing, Solution Focused and Strength-based  Summary:Tina D. Allenis [id number].o.femalewho presents withDepression and Anxiety.The OPT therapist worked with thepatientfor herongoingOPT treatment. The OPT therapist utilized Motivational Interviewing to assist in creating therapeutic repore. The patient in the session was engaged and work in Tour manager about hertriggers and symptoms over the past few weeksincluding recently contracting COVID, with today being her first day back to work in a week as well as managing external stressors in her family interaction.The OPT therapist utilized Cognitive Behavioral Therapy through cognitive restructuring as well as worked with the patient on coping strategies to assist in management ofmoodand anxiety. The patient noted coming back to work has been difficulty due to having to fix problems that occurred during her time out.The OPT therapist worked with the patient providing support andpsycho-education.The OPT therapist encouraged the patient to continue work on self care.  Suicidal/Homicidal:Nowithout intent/plan  Therapist Response:The OPT therapist worked with the patient for the patients scheduled session. The patient  was engaged in hersession and gave feedback in relation to triggers, symptoms, and behavior responses over the pastfewweeks. The OPT therapist worked with the patient utilizing an in session Cognitive Behavioral Therapy exercise. The patient was responsive in the session and verbalized, "I have to get it all figured back out by Wed/Thursday I should have it all worked back out even if I have to stay over and my anxiety about returning kept me up last night so I am tired, I just tend to be hard on myself".The OPT therapistgave support and worked with the patient on communication with her family membersand the importance of setting boundaries to limit her triggering. The patient noted that she has increased interaction and has been working on her relationship with [name] and this has been extremely positive for the patient over the past week. The OPT therapist worked with the patient to pace herself in getting back into and catching back up at work.The OPT therapist will continue treatment work with the patient in her next scheduled session  Plan: Return again in2/3weeks.  Diagnosis:Axis I:Moderate episode of recurrent major depressive disorder, Generalized Anxiety  Axis II:No diagnosis  I discussed the assessment and treatment plan with the patient. The patient was provided an opportunity to ask questions and all were answered. The patient agreed with the plan and demonstrated an understanding of the instructions.  The patient was advised to call back or seek an in-person evaluation if the symptoms worsen or if the condition fails to improve as anticipated.  I provided37minutes of non-face-to-face time during this encounter.  [name], LCSW  10/10/2020

## 2020-11-02 ENCOUNTER — Ambulatory Visit (HOSPITAL_COMMUNITY): Payer: Medicaid Other | Admitting: Clinical

## 2020-11-06 ENCOUNTER — Telehealth (INDEPENDENT_AMBULATORY_CARE_PROVIDER_SITE_OTHER): Payer: Medicaid Other | Admitting: Psychiatry

## 2020-11-06 ENCOUNTER — Other Ambulatory Visit: Payer: Self-pay

## 2020-11-06 ENCOUNTER — Encounter (HOSPITAL_COMMUNITY): Payer: Self-pay | Admitting: Psychiatry

## 2020-11-06 DIAGNOSIS — F411 Generalized anxiety disorder: Secondary | ICD-10-CM | POA: Diagnosis not present

## 2020-11-06 DIAGNOSIS — F331 Major depressive disorder, recurrent, moderate: Secondary | ICD-10-CM

## 2020-11-06 MED ORDER — ALPRAZOLAM 1 MG PO TABS: 1.0000 mg | ORAL_TABLET | Freq: Four times a day (QID) | ORAL | 2 refills | Status: DC

## 2020-11-06 MED ORDER — FLUOXETINE HCL 20 MG PO CAPS: 20.0000 mg | ORAL_CAPSULE | Freq: Every day | ORAL | 2 refills | Status: DC

## 2020-11-06 MED ORDER — TRAZODONE HCL 150 MG PO TABS: 150.0000 mg | ORAL_TABLET | Freq: Every day | ORAL | 2 refills | Status: DC

## 2020-11-06 NOTE — Progress Notes (Signed)
Virtual Visit via Video Note  I connected with Tina Weber on 11/06/20 at  8:40 AM EDT by a video enabled telemedicine application and verified that I am speaking with the correct person using two identifiers.  Location: Patient:work Provider: home   I discussed the limitations of evaluation and management by telemedicine and the availability of in person appointments. The patient expressed understanding and agreed to proceed    I discussed the assessment and treatment plan with the patient. The patient was provided an opportunity to ask questions and all were answered. The patient agreed with the plan and demonstrated an understanding of the instructions.   The patient was advised to call back or seek an in-person evaluation if the symptoms worsen or if the condition fails to improve as anticipated.  I provided 15 minutes of non-face-to-face time during this encounter.   [name], MD  [facility] MD/PA/NP OP Progress Note  11/06/2020 8:52 AM Tina Weber  MRN:  [id number]  Chief Complaint:  Chief Complaint    Depression; Anxiety; Follow-up     HPI: This patient is a 40 year old divorced white female who lives with her39-year-old daughter in [place]. 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  The patient returns for follow-up after 2 months.  She has had some episodes of anxiety but overall she is doing well.  She is not sleeping that well and waking up thinking about things she has to do at work.  She relates however that she never received that higher dosage of trazodone even though I sent it in.  I will resend it today.  Her mood is generally good and she denies significant depression and the Xanax continues to help her anxiety.  She is enjoying time with her daughter and her boyfriend.  She denies any thoughts of self-harm or suicidal ideation Visit Diagnosis:    ICD-10-CM   1. Moderate episode of recurrent  major depressive disorder (HCC)  F33.1   2. Generalized anxiety disorder  F41.1     Past Psychiatric History: The patient has a history of depression in high school and postpartum depression after the birth of her daughter which responded to Prozac  Past Medical History:  Past Medical History:  Diagnosis Date  . Anxiety   . Depression   . Migraines   . Seizures (HCC)    Last seizure almost one year ago  . Supervision of normal pregnancy in first trimester 07/19/2014    [facility] F[facility] shukri nistler 40 yo wm 1st Dating By LMP and [place] Pap  07/19/14 GC/CT Initial:                36+wks: Genetic Screen NT/IT:  CF screen  Anatomic [place]  Flu vaccine  Tdap Recommended ~ 28wks Glucose Screen  2 hr GBS  Feed Preference  Contraception  Circumcision  Childbirth Classes  Pediatrician      Past Surgical History:  Procedure Laterality Date  . CHOLECYSTECTOMY    . WISDOM TOOTH EXTRACTION      Family Psychiatric History: see below  Family History:  Family History  Problem Relation Age of Onset  . Congestive Heart Failure Maternal Grandmother   . Diabetes Maternal Grandfather   . Hypertension Father   . Anxiety disorder Father   . Cancer Mother        thyroid  . Anxiety disorder Mother     Social History:  Social History   Socioeconomic History  . Marital status: Married    Spouse name: Not on file  . Number of children: Not on file  . Years of education: Not on file  . Highest education level: Not on file  Occupational History  . Not on file  Tobacco Use  . Smoking status: Never Smoker  . Smokeless tobacco: Never Used  Substance and Sexual Activity  . Alcohol use: No    Comment: 01-05-16 per pt no   . Drug use: No    Comment: 01-05-16 per pt no  . Sexual activity: Never    Birth control/protection: None  Other Topics Concern  . Not on file  Social History Narrative  . Not on file   Social Determinants of Health   Financial Resource Strain: Not on file  Food  Insecurity: Not on file  Transportation Needs: Not on file  Physical Activity: Not on file  Stress: Not on file  Social Connections: Not on file    Allergies: No Known Allergies  Metabolic Disorder Labs: No results found for: HGBA1C, MPG No results found for: PROLACTIN No results found for: CHOL, TRIG, HDL, CHOLHDL, VLDL, LDLCALC Lab Results  Component Value Date   TSH 0.674 07/19/2014    Therapeutic Level Labs: No results found for: LITHIUM No results found for: VALPROATE No components found for:  CBMZ  Current Medications: Current Outpatient Medications  Medication Sig Dispense Refill  . ALPRAZolam (XANAX) 1 MG tablet Take 1 tablet (1 mg total) by mouth in the morning, at noon, in the evening, and at bedtime. 120 tablet 2  . FLUoxetine (PROZAC) 20 MG capsule Take 1 capsule (20 mg total) by mouth daily. 30 capsule 2  . PARAGARD INTRAUTERINE COPPER IU by Intrauterine route.    . traZODone (DESYREL) 150 MG tablet Take 1 tablet (150 mg total) by mouth at bedtime. 30 tablet 2   No current facility-administered medications for this visit.     Musculoskeletal: Strength & Muscle Tone: within normal limits Gait & Station: normal Patient leans: N/A  Psychiatric Specialty Exam: Review of Systems  Psychiatric/Behavioral: The patient is nervous/anxious.     There were no vitals taken for this visit.There is no height or weight on file to calculate BMI.  General Appearance: Casual and Fairly Groomed  Eye Contact:  Good  Speech:  Clear and Coherent  Volume:  Normal  Mood:  Anxious and Euthymic  Affect:  Appropriate and Congruent  Thought Process:  Goal Directed  Orientation:  Full (Time, Place, and Person)  Thought Content: Rumination   Suicidal Thoughts:  No  Homicidal Thoughts:  No  Memory:  Immediate;   Good Recent;   Good Remote;   Good  Judgement:  Good  Insight:  Good  Psychomotor Activity:  Normal  Concentration:  Concentration: Good and Attention Span: Good   Recall:  Good  Fund of Knowledge: Good  Language: Good  Akathisia:  No  Handed:  Right  AIMS (if indicated): not done  Assets:  Communication Skills Desire for Improvement Physical Health Resilience Social Support Talents/Skills  ADL's:  Intact  Cognition: WNL  Sleep:  Fair   Screenings: PHQ2-9   Flowsheet Row [facility]06[facility]2020 [facility]  PHQ-2 Total Score 0    Flowsheet Row Video Visit from 11/06/2020 in [facility]  C-SSRS RISK CATEGORY No Risk       Assessment and Plan: This patient is a 40 year old female  with a history of depression and anxiety.  She continues to do well.  Her sleep is not that great but she did not receive the higher trazodone.  I will resend the trazodone 150 mg for sleep.  She will continue Xanax 1 mg 4 times daily for anxiety and Prozac 20 mg daily for depression.  She will return to see me in 2 months   [name], MD 11/06/2020, 8:52 AM

## 2020-11-22 ENCOUNTER — Telehealth (HOSPITAL_COMMUNITY): Payer: Self-pay | Admitting: *Deleted

## 2020-11-22 NOTE — Telephone Encounter (Signed)
LMOM for patient to call office to sch a f/u appt with provider

## 2020-11-28 ENCOUNTER — Telehealth (HOSPITAL_COMMUNITY): Payer: Self-pay | Admitting: *Deleted

## 2020-11-28 ENCOUNTER — Other Ambulatory Visit (HOSPITAL_COMMUNITY): Payer: Self-pay | Admitting: Psychiatry

## 2020-11-28 MED ORDER — FLUOXETINE HCL 20 MG PO CAPS: 20.0000 mg | ORAL_CAPSULE | Freq: Every day | ORAL | 2 refills | Status: DC

## 2020-11-28 NOTE — Telephone Encounter (Signed)
Patient called stating provider had refilled script and she threw it in the trash. Per pt she had not taken it in 3 day and while she was talking on the phone with her mother and had her daughter trash in her other hand and she know she know she threw it in the trash. Per pt they take out the trash every day and she its gone. Per pt she is about to go to [place] with her daughter and she don't have her meds. Per pt she would like for provider to please send in another script and provide approval for the pharmacy to give her another set of script. Per pt she knows she have to pay out of pocket due to insurance not going to pay again until she is due.   Per pt she is sorry that she accidentally threw her Prozac in the trash but she would please like another script sent into pharmacy.   [phone].

## 2020-11-28 NOTE — Telephone Encounter (Signed)
sent 

## 2020-12-26 ENCOUNTER — Other Ambulatory Visit: Payer: Self-pay

## 2020-12-26 ENCOUNTER — Ambulatory Visit (INDEPENDENT_AMBULATORY_CARE_PROVIDER_SITE_OTHER): Payer: Medicaid Other | Admitting: Clinical

## 2020-12-26 DIAGNOSIS — F411 Generalized anxiety disorder: Secondary | ICD-10-CM

## 2020-12-26 DIAGNOSIS — F331 Major depressive disorder, recurrent, moderate: Secondary | ICD-10-CM | POA: Diagnosis not present

## 2020-12-26 NOTE — Progress Notes (Deleted)
Virtual Visit via Video Note  I connected with [name] on 12/26/20 at 10:00 AM EDT by a video enabled telemedicine application and verified that I am speaking with the correct person using two identifiers.  Location: Patient: *** Provider: ***   I discussed the limitations of evaluation and management by telemedicine and the availability of in person appointments. The patient expressed understanding and agreed to proceed.  History of Present Illness:    Observations/Objective:   Assessment and Plan:   Follow Up Instructions:    I discussed the assessment and treatment plan with the patient. The patient was provided an opportunity to ask questions and all were answered. The patient agreed with the plan and demonstrated an understanding of the instructions.   The patient was advised to call back or seek an in-person evaluation if the symptoms worsen or if the condition fails to improve as anticipated.  I provided *** minutes of non-face-to-face time during this encounter.   [name], LCSW  THERAPIST PROGRESS NOTE  Session Time:8:00AM-8:45AM  Participation Level:Active  Behavioral Response:CasualAlertAnxieous  Type of Therapy:Individual Therapy  Treatment Goals addressed:Anger and Coping  Interventions:CBT, Motivational Interviewing, Solution Focused and Strength-based  Summary:Maily D. Allenis [id number].o.femalewho presents withDepression and Anxiety.The OPT therapist worked with thepatientfor herinitialOPT treatment. The OPT therapist utilized Motivational Interviewing to assist in creating therapeutic repore. The patient in the session was engaged and work in Tour manager about hertriggers and symptoms over the past few weeksincluding anxiety triggered by her Mother showing up randomly to visit and staying for 5 days.The OPT therapist utilized Cognitive Behavioral Therapy through cognitive restructuring as well  as worked with the patient on coping strategies to assist in management ofmoodand anxiety.The OPT therapist worked with the patient providing support andpsycho-education.  Suicidal/Homicidal:Nowithout intent/plan  Therapist Response:The OPT therapist worked with the patient for the patients scheduled session. The patient was engaged in hersession and gave feedback in relation to triggers, symptoms, and behavior responses over the pastfewweeks. The OPT therapist worked with the patient utilizing an in session Cognitive Behavioral Therapy exercise. The patient was responsive in the session and verbalized, "Anastasio Auerbach I get guilt tripped into agreeing to things that trigger my Anxiety and I am going to work harder at putting into place boundaries with my family".The OPT therapistgave support and worked with the patient on communication with her family members and the importance of setting boundaries to limit her triggering. The OPT therapist will continue treatment work with the patient in her next scheduled session  Plan: Return again in2/3weeks.  Diagnosis:Axis I:Moderate episode of recurrent major depressive disorder, Generalized Anxiety  Axis II:No diagnosis  I discussed the assessment and treatment plan with the patient. The patient was provided an opportunity to ask questions and all were answered. The patient agreed with the plan and demonstrated an understanding of the instructions.  The patient was advised to call back or seek an in-person evaluation if the symptoms worsen or if the condition fails to improve as anticipated.  I provided34minutes of non-face-to-face time during this encounter.  [name], LCSW

## 2020-12-26 NOTE — Progress Notes (Signed)
Virtual Visit via Video Note  I connected withJennifer D Allenon5/10/22at10:00 AM ESTby a video enabled telemedicine application and verified that I am speaking with the correct person using two identifiers.  Location: Patient:Home Provider:Office  I discussed the limitations of evaluation and management by telemedicine and the availability of in person appointments. The patient expressed understanding and agreed to proceed.  THERAPIST PROGRESS NOTE  Session Time:10:00AM-10:45AM  Participation Level:Active  Behavioral Response:CasualAlertAnxieous  Type of Therapy:Individual Therapy  Treatment Goals addressed:Anger and Coping  Interventions:CBT, Motivational Interviewing, Solution Focused and Strength-based  Summary:Tina D. Allenis [id number].o.femalewho presents withDepression and Anxiety.The OPT therapist worked with thepatientfor herongoingOPT treatment. The OPT therapist utilized Motivational Interviewing to assist in creating therapeutic repore. The patient in the session was engaged and work in Tour manager about hertriggers and symptoms over the past few weeksincludingrecently difficulty with COVID symptoms and being out of work and financial stress from being out of work.The OPT therapist utilized Cognitive Behavioral Therapy through cognitive restructuring as well as worked with the patient on coping strategies to assist in management ofmoodand anxiety. The OPT therapist worked with the patient providing support andpsycho-education.The OPT therapist encouraged the patient to continue work on self care.  Suicidal/Homicidal:Nowithout intent/plan  Therapist Response:The OPT therapist worked with the patient for the patients scheduled session. The patient was engaged in hersession and gave feedback in relation to triggers, symptoms, and behavior responses over the pastfewweeks. The OPT therapist worked with the  patient utilizing an in session Cognitive Behavioral Therapy exercise. The patient was responsive in the session and verbalized, "I am exhausted and emotionally drained not being able to work when I need to be at work right now".The OPT therapistgave support and worked with the patient on communication with her family membersand the importance of setting boundaries to limit her triggering.The patient noted that she has been working on her relationship with her family and active in her exercising until she was not able due to COVID.The OPT therapist will continue treatment work with the patient in her next scheduled session  Plan: Return again in2/3weeks.  Diagnosis:Axis I:Moderate episode of recurrent major depressive disorder, Generalized Anxiety  Axis II:No diagnosis  I discussed the assessment and treatment plan with the patient. The patient was provided an opportunity to ask questions and all were answered. The patient agreed with the plan and demonstrated an understanding of the instructions.  The patient was advised to call back or seek an in-person evaluation if the symptoms worsen or if the condition fails to improve as anticipated.  I provided46minutes of non-face-to-face time during this encounter.  Tina Burn, LCSW  12/26/2020

## 2021-01-08 ENCOUNTER — Other Ambulatory Visit: Payer: Self-pay

## 2021-01-08 ENCOUNTER — Encounter (HOSPITAL_COMMUNITY): Payer: Self-pay | Admitting: Psychiatry

## 2021-01-08 ENCOUNTER — Telehealth (INDEPENDENT_AMBULATORY_CARE_PROVIDER_SITE_OTHER): Payer: Medicaid Other | Admitting: Psychiatry

## 2021-01-08 DIAGNOSIS — F331 Major depressive disorder, recurrent, moderate: Secondary | ICD-10-CM

## 2021-01-08 DIAGNOSIS — F900 Attention-deficit hyperactivity disorder, predominantly inattentive type: Secondary | ICD-10-CM

## 2021-01-08 DIAGNOSIS — F411 Generalized anxiety disorder: Secondary | ICD-10-CM

## 2021-01-08 MED ORDER — TRAZODONE HCL 150 MG PO TABS: 150.0000 mg | ORAL_TABLET | Freq: Every day | ORAL | 2 refills | Status: DC

## 2021-01-08 MED ORDER — ALPRAZOLAM 1 MG PO TABS: 1.0000 mg | ORAL_TABLET | Freq: Four times a day (QID) | ORAL | 2 refills | Status: DC

## 2021-01-08 MED ORDER — FLUOXETINE HCL 20 MG PO CAPS: 20.0000 mg | ORAL_CAPSULE | Freq: Every day | ORAL | 2 refills | Status: DC

## 2021-01-08 MED ORDER — METHYLPHENIDATE HCL 10 MG PO TABS: 10.0000 mg | ORAL_TABLET | Freq: Two times a day (BID) | ORAL | 0 refills | Status: DC

## 2021-01-08 NOTE — Progress Notes (Signed)
Virtual Visit via Video Note  I connected with Tina Weber on 01/08/21 at 11:20 AM EDT by a video enabled telemedicine application and verified that I am speaking with the correct person using two identifiers.  Location: Patient: home Provider: home office   I discussed the limitations of evaluation and management by telemedicine and the availability of in person appointments. The patient expressed understanding and agreed to proceed.    I discussed the assessment and treatment plan with the patient. The patient was provided an opportunity to ask questions and all were answered. The patient agreed with the plan and demonstrated an understanding of the instructions.   The patient was advised to call back or seek an in-person evaluation if the symptoms worsen or if the condition fails to improve as anticipated.  I provided 15 minutes of non-face-to-face time during this encounter.   [name], MD  [facility] MD/PA/NP OP Progress Note  01/08/2021 11:50 AM Tina Weber  MRN:  [id number]  Chief Complaint:  Chief Complaint    Anxiety; Depression; Follow-up     HPI: This patient is a 40 year old divorced white female who lives with her58-year-old daughter in [place]. 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  She states that she is doing a lot more such as going to the gym every day and doing things with her daughter and family and boyfriend.  However she states she is "not finding joy" in anything much.  She states that she had COVID for the third time earlier this month.  She finds that she cannot focus and her brain is "in a fog."  I suggested that we add some low-dose methylphenidate to help with this and she is in agreement.  Despite stating that she is not that happy she was smiling and laughing quite a bit today and she denies serious depression.  She states that she is more  "irritable."     Visit Diagnosis:    ICD-10-CM   1. Moderate episode of recurrent major depressive disorder (HCC)  F33.1   2. Generalized anxiety disorder  F41.1   3. Attention deficit hyperactivity disorder (ADHD), predominantly inattentive type  F90.0     Past Psychiatric History: Patient has a history of depression in high school and postpartum depression after the birth of her daughter which responded to Prozac.  She also now tells me that she probably had ADHD as a child  Past Medical History:  Past Medical History:  Diagnosis Date  . Anxiety   . Depression   . Migraines   . Seizures (HCC)    Last seizure almost one year ago  . Supervision of normal pregnancy in first trimester 07/19/2014    [facility] F[facility] jaemarie hochberg 40 yo wm 1st Dating By LMP and [place] Pap  07/19/14 GC/CT Initial:                36+wks: Genetic Screen NT/IT:  CF screen  Anatomic [place]  Flu vaccine  Tdap Recommended ~ 28wks Glucose Screen  2 hr GBS  Feed Preference  Contraception  Circumcision  Childbirth Classes  Pediatrician      Past Surgical History:  Procedure Laterality Date  . CHOLECYSTECTOMY    . WISDOM TOOTH EXTRACTION      Family Psychiatric History: see below  Family History:  Family History  Problem Relation Age of  Onset  . Congestive Heart Failure Maternal Grandmother   . Diabetes Maternal Grandfather   . Hypertension Father   . Anxiety disorder Father   . Cancer Mother        thyroid  . Anxiety disorder Mother     Social History:  Social History   Socioeconomic History  . Marital status: Married    Spouse name: Not on file  . Number of children: Not on file  . Years of education: Not on file  . Highest education level: Not on file  Occupational History  . Not on file  Tobacco Use  . Smoking status: Never Smoker  . Smokeless tobacco: Never Used  Substance and Sexual Activity  . Alcohol use: No    Comment: 01-05-16 per pt no   . Drug use: No    Comment: 01-05-16 per pt  no  . Sexual activity: Never    Birth control/protection: None  Other Topics Concern  . Not on file  Social History Narrative  . Not on file   Social Determinants of Health   Financial Resource Strain: Not on file  Food Insecurity: Not on file  Transportation Needs: Not on file  Physical Activity: Not on file  Stress: Not on file  Social Connections: Not on file    Allergies: No Known Allergies  Metabolic Disorder Labs: No results found for: HGBA1C, MPG No results found for: PROLACTIN No results found for: CHOL, TRIG, HDL, CHOLHDL, VLDL, LDLCALC Lab Results  Component Value Date   TSH 0.674 07/19/2014    Therapeutic Level Labs: No results found for: LITHIUM No results found for: VALPROATE No components found for:  CBMZ  Current Medications: Current Outpatient Medications  Medication Sig Dispense Refill  . methylphenidate (RITALIN) 10 MG tablet Take 1 tablet (10 mg total) by mouth 2 (two) times daily with breakfast and lunch. 60 tablet 0  . ALPRAZolam (XANAX) 1 MG tablet Take 1 tablet (1 mg total) by mouth in the morning, at noon, in the evening, and at bedtime. 120 tablet 2  . FLUoxetine (PROZAC) 20 MG capsule Take 1 capsule (20 mg total) by mouth daily. 30 capsule 2  . PARAGARD INTRAUTERINE COPPER IU by Intrauterine route.    . traZODone (DESYREL) 150 MG tablet Take 1 tablet (150 mg total) by mouth at bedtime. 30 tablet 2   No current facility-administered medications for this visit.     Musculoskeletal: Strength & Muscle Tone: within normal limits Gait & Station: normal Patient leans: N/A  Psychiatric Specialty Exam: Review of Systems  Psychiatric/Behavioral: Positive for decreased concentration.  All other systems reviewed and are negative.   There were no vitals taken for this visit.There is no height or weight on file to calculate BMI.  General Appearance: Casual and Fairly Groomed  Eye Contact:  Good  Speech:  Clear and Coherent  Volume:  Normal   Mood:  Euthymic  Affect:  Appropriate and Congruent  Thought Process:  Goal Directed  Orientation:  Full (Time, Place, and Person)  Thought Content: Rumination   Suicidal Thoughts:  No  Homicidal Thoughts:  No  Memory:  Immediate;   Good Recent;   Good Remote;   Good  Judgement:  Good  Insight:  Good  Psychomotor Activity:  Normal  Concentration:  Concentration: Poor and Attention Span: Poor  Recall:  Good  Fund of Knowledge: Good  Language: Good  Akathisia:  No  Handed:  Right  AIMS (if indicated): not done  Assets:  Communication  Skills Desire for Improvement Physical Health Resilience Social Support Talents/Skills  ADL's:  Intact  Cognition: WNL  Sleep:  Good   Screenings: PHQ2-9   Flowsheet Row [facility] Video Visit from 11/06/2020 in [facility]  PHQ-2 Total Score 3 0  PHQ-9 Total Score 5 --    Flowsheet Row Video Visit from 01/08/2021 in [facility] Video Visit from 11/06/2020 in [facility]  C-SSRS RISK CATEGORY No Risk No Risk       Assessment and Plan: This patient is a 40 year old female with a history depression and anxiety.  She is having a lot of trouble with focus which may be a symptom of long COVID or perhaps her ADD coming to the surface.  At any rate we will try methylphenidate 10 mg twice daily to help with this.  She will continue trazodone 150 mg at bedtime for sleep, Xanax 1 mg 4 times daily for anxiety and Prozac 20 mg daily for depression.  She will return to see me in 2 months   [name], MD 01/08/2021, 11:50 AM

## 2021-01-09 ENCOUNTER — Ambulatory Visit (HOSPITAL_COMMUNITY): Payer: Medicaid Other | Admitting: Clinical

## 2021-01-11 ENCOUNTER — Other Ambulatory Visit (HOSPITAL_COMMUNITY): Payer: Self-pay | Admitting: Psychiatry

## 2021-01-11 ENCOUNTER — Telehealth (HOSPITAL_COMMUNITY): Payer: Self-pay | Admitting: *Deleted

## 2021-01-11 MED ORDER — METHYLPHENIDATE HCL 20 MG PO TABS: 20.0000 mg | ORAL_TABLET | Freq: Two times a day (BID) | ORAL | 0 refills | Status: DC

## 2021-01-11 NOTE — Telephone Encounter (Signed)
Tell her to take 2 of the 10 mg in the am and at lunch. I have send in 20 mg bid but she might as well use up the 10 mgs

## 2021-01-11 NOTE — Telephone Encounter (Signed)
Patient called stating the current dose is not working. Per pt she did not notice any difference. Per pt she was instructed by provider to call back if dose was not working so that she could go up on the dose. Medication name is methylphenidate (RITALIN) 10 MG tablet. [facility] [place] [facility] of [address]

## 2021-01-11 NOTE — Telephone Encounter (Signed)
Informed patient with information and she verbalized understanding.  

## 2021-01-17 ENCOUNTER — Other Ambulatory Visit: Payer: Self-pay

## 2021-01-17 ENCOUNTER — Ambulatory Visit (INDEPENDENT_AMBULATORY_CARE_PROVIDER_SITE_OTHER): Payer: Medicaid Other | Admitting: Clinical

## 2021-01-17 DIAGNOSIS — F331 Major depressive disorder, recurrent, moderate: Secondary | ICD-10-CM | POA: Diagnosis not present

## 2021-01-17 DIAGNOSIS — F411 Generalized anxiety disorder: Secondary | ICD-10-CM | POA: Diagnosis not present

## 2021-01-17 DIAGNOSIS — F900 Attention-deficit hyperactivity disorder, predominantly inattentive type: Secondary | ICD-10-CM | POA: Diagnosis not present

## 2021-01-17 NOTE — Progress Notes (Signed)
Virtual Visit via Video Note  I connected withJennifer D Allenon6/1/22at11:00 AM ESTby a video enabled telemedicine application and verified that I am speaking with the correct person using two identifiers.  Location: Patient:Home Provider:Office  I discussed the limitations of evaluation and management by telemedicine and the availability of in person appointments. The patient expressed understanding and agreed to proceed.  THERAPIST PROGRESS NOTE  Session Time:11:00AM-11:45AM  Participation Level:Active  Behavioral Response:CasualAlertAnxieous  Type of Therapy:Individual Therapy  Treatment Goals addressed:Anger and Coping  Interventions:CBT, Motivational Interviewing, Solution Focused and Strength-based  Summary:Tina D. Allenis [id number] presents withDepression and Anxiety.The OPT therapist worked with thepatientfor herongoingOPT treatment. The OPT therapist utilized Motivational Interviewing to assist in creating therapeutic repore. The patient in the session was engaged and work in Tour manager about hertriggers and symptoms over the past few weeks.The patient has been working to implement self care and recently went hiking over the [employer].The OPT therapist utilized Cognitive Behavioral Therapy through cognitive restructuring as well as worked with the patient on coping strategies to assist in management ofmoodand anxiety. The OPT therapist worked with the patient providing support andpsycho-education.The OPT therapist encouraged the patient to continue work on self care. The patient reported a noted change that she has been able to better manage stressors due to finding a better balance and implementing better self care. Suicidal/Homicidal:Nowithout intent/plan  Therapist Response:The OPT therapist worked with the patient for the patients scheduled session. The patient was engaged in  hersession and gave feedback in relation to triggers, symptoms, and behavior responses over the pastfewweeks. The OPT therapist worked with the patient utilizing an in session Cognitive Behavioral Therapy exercise. The patient was responsive in the session and verbalized, "Iforced myself to finally take a break and this has helped me to find a place of calm; so even this week when I have had other stressors I have been able to manage more effectively".The OPT therapistgave support and worked with the patient on communication with her family membersand managing the impact of external family stressors.The patient noted that she has been working on her relationship with her family and active in her exercising with her gym membership..The OPT therapist will continue treatment work with the patient in her next scheduled session  Plan: Return again in2/3weeks.  Diagnosis:Axis I:Moderate episode of recurrent major depressive disorder, Generalized Anxiety  Axis II:No diagnosis  I discussed the assessment and treatment plan with the patient. The patient was provided an opportunity to ask questions and all were answered. The patient agreed with the plan and demonstrated an understanding of the instructions.  The patient was advised to call back or seek an in-person evaluation if the symptoms worsen or if the condition fails to improve as anticipated.  I provided28minutes of non-face-to-face time during this encounter.  [name], LCSW  01/17/2021

## 2021-02-26 ENCOUNTER — Telehealth (HOSPITAL_COMMUNITY): Payer: Self-pay | Admitting: Psychiatry

## 2021-02-26 ENCOUNTER — Other Ambulatory Visit: Payer: Self-pay | Admitting: Psychiatry

## 2021-02-26 MED ORDER — METHYLPHENIDATE HCL 20 MG PO TABS: 20.0000 mg | ORAL_TABLET | Freq: Two times a day (BID) | ORAL | 0 refills | Status: DC

## 2021-02-26 NOTE — Telephone Encounter (Signed)
Ordered.   I have utilized the [place] Controlled Substances Reporting System (PMP AWARxE) to confirm adherence regarding the patient's medication. My review reveals appropriate prescription fills.

## 2021-02-26 NOTE — Telephone Encounter (Signed)
Patient calling in requesting refill for Ritalin, [facility] [address] in [place].

## 2021-03-22 ENCOUNTER — Telehealth (INDEPENDENT_AMBULATORY_CARE_PROVIDER_SITE_OTHER): Payer: Medicaid Other | Admitting: Psychiatry

## 2021-03-22 ENCOUNTER — Other Ambulatory Visit: Payer: Self-pay

## 2021-03-22 ENCOUNTER — Encounter (HOSPITAL_COMMUNITY): Payer: Self-pay | Admitting: Psychiatry

## 2021-03-22 DIAGNOSIS — F331 Major depressive disorder, recurrent, moderate: Secondary | ICD-10-CM

## 2021-03-22 DIAGNOSIS — F411 Generalized anxiety disorder: Secondary | ICD-10-CM | POA: Diagnosis not present

## 2021-03-22 DIAGNOSIS — F900 Attention-deficit hyperactivity disorder, predominantly inattentive type: Secondary | ICD-10-CM

## 2021-03-22 MED ORDER — METHYLPHENIDATE HCL 20 MG PO TABS: 20.0000 mg | ORAL_TABLET | Freq: Two times a day (BID) | ORAL | 0 refills | Status: DC

## 2021-03-22 MED ORDER — ALPRAZOLAM 1 MG PO TABS: 1.0000 mg | ORAL_TABLET | Freq: Four times a day (QID) | ORAL | 2 refills | Status: DC

## 2021-03-22 MED ORDER — FLUOXETINE HCL 20 MG PO CAPS: 20.0000 mg | ORAL_CAPSULE | Freq: Every day | ORAL | 2 refills | Status: DC

## 2021-03-22 NOTE — Progress Notes (Signed)
Virtual Visit via Telephone Note  I connected with Tina Weber on 03/22/21 at  1:20 PM EDT by telephone and verified that I am speaking with the correct person using two identifiers.  Location: Patient: home Provider: office   I discussed the limitations, risks, security and privacy concerns of performing an evaluation and management service by telephone and the availability of in person appointments. I also discussed with the patient that there may be a patient responsible charge related to this service. The patient expressed understanding and agreed to proceed.       I discussed the assessment and treatment plan with the patient. The patient was provided an opportunity to ask questions and all were answered. The patient agreed with the plan and demonstrated an understanding of the instructions.   The patient was advised to call back or seek an in-person evaluation if the symptoms worsen or if the condition fails to improve as anticipated.  I provided 15 minutes of non-face-to-face time during this encounter.   [name], MD  [facility] MD/PA/NP OP Progress Note  03/22/2021 1:36 PM Tina Weber  MRN:  [id number]  Chief Complaint:  Chief Complaint   Depression; Anxiety; ADD; Follow-up    HPI: This patient is a 40 year old divorced white female who lives with her 43-year-old daughter in [place].  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  Last time we added methylphenidate and had titrated up to 20 mg twice daily.  This is helping a good deal with her focus and concentration.  She does state that it wears off early and would like to go to something higher.  I am worried that going higher on this dosage would cause tachycardia or jitteriness.  We talked about trying a longer acting formulation but she does not want to change anything at this point.  She states that her  depression and anxiety are under good control and she is sleeping well without the trazodone.  She is happy because she is up for a promotion at work Visit Diagnosis:    ICD-10-CM   1. Moderate episode of recurrent major depressive disorder (HCC)  F33.1     2. Generalized anxiety disorder  F41.1     3. 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  She also had ADHD as a child  Past Medical History:  Past Medical History:  Diagnosis Date   Anxiety    Depression    Migraines    Seizures (HCC)    Last seizure almost one year ago   Supervision of normal pregnancy in first trimester 07/19/2014    [facility] F[facility] adeleine pask 40 yo wm 1st Dating By LMP and [place] Pap  07/19/14 GC/CT Initial:                36+wks: Genetic Screen NT/IT:  CF screen  Anatomic [place]  Flu vaccine  Tdap Recommended ~ 28wks Glucose Screen  2 hr GBS  Feed Preference  Contraception  Circumcision  Childbirth Classes  Pediatrician      Past Surgical History:  Procedure Laterality Date   CHOLECYSTECTOMY     WISDOM TOOTH EXTRACTION  Family Psychiatric History: see below  Family History:  Family History  Problem Relation Age of Onset   Congestive Heart Failure Maternal Grandmother    Diabetes Maternal Grandfather    Hypertension Father    Anxiety disorder Father    Cancer Mother        thyroid   Anxiety disorder Mother     Social History:  Social History   Socioeconomic History   Marital status: Married    Spouse name: Not on file   Number of children: Not on file   Years of education: Not on file   Highest education level: Not on file  Occupational History   Not on file  Tobacco Use   Smoking status: Never   Smokeless tobacco: Never  Substance and Sexual Activity   Alcohol use: No     Comment: 01-05-16 per pt no    Drug use: No    Comment: 01-05-16 per pt no   Sexual activity: Never    Birth control/protection: None  Other Topics Concern   Not on file  Social History Narrative   Not on file   Social Determinants of Health   Financial Resource Strain: Not on file  Food Insecurity: Not on file  Transportation Needs: Not on file  Physical Activity: Not on file  Stress: Not on file  Social Connections: Not on file    Allergies: No Known Allergies  Metabolic Disorder Labs: No results found for: HGBA1C, MPG No results found for: PROLACTIN No results found for: CHOL, TRIG, HDL, CHOLHDL, VLDL, LDLCALC Lab Results  Component Value Date   TSH 0.674 07/19/2014    Therapeutic Level Labs: No results found for: LITHIUM No results found for: VALPROATE No components found for:  CBMZ  Current Medications: Current Outpatient Medications  Medication Sig Dispense Refill   methylphenidate (RITALIN) 20 MG tablet Take 1 tablet (20 mg total) by mouth 2 (two) times daily with breakfast and lunch. 60 tablet 0   methylphenidate (RITALIN) 20 MG tablet Take 1 tablet (20 mg total) by mouth 2 (two) times daily with breakfast and lunch. 60 tablet 0   ALPRAZolam (XANAX) 1 MG tablet Take 1 tablet (1 mg total) by mouth in the morning, at noon, in the evening, and at bedtime. 120 tablet 2   FLUoxetine (PROZAC) 20 MG capsule Take 1 capsule (20 mg total) by mouth daily. 30 capsule 2   methylphenidate (RITALIN) 20 MG tablet Take 1 tablet (20 mg total) by mouth 2 (two) times daily with breakfast and lunch. 60 tablet 0   PARAGARD INTRAUTERINE COPPER IU by Intrauterine route.     traZODone (DESYREL) 150 MG tablet Take 1 tablet (150 mg total) by mouth at bedtime. 30 tablet 2   No current facility-administered medications for this visit.     Musculoskeletal: Strength & Muscle Tone: within normal limits Gait & Station: normal Patient leans: N/A  Psychiatric Specialty Exam: Review of  Systems  All other systems reviewed and are negative.  There were no vitals taken for this visit.There is no height or weight on file to calculate BMI.  General Appearance: NA  Eye Contact:  NA  Speech:  Clear and Coherent  Volume:  Normal  Mood:  Euthymic  Affect:  NA  Thought Process:  Goal Directed  Orientation:  Full (Time, Place, and Person)  Thought Content: WDL   Suicidal Thoughts:  No  Homicidal Thoughts:  No  Memory:  Immediate;   Good Recent;   Good Remote;  Good  Judgement:  Good  Insight:  Good  Psychomotor Activity:  Normal  Concentration:  Concentration: Good and Attention Span: Good  Recall:  Good  Fund of Knowledge: Good  Language: Good  Akathisia:  No  Handed:  Right  AIMS (if indicated): not done  Assets:  Communication Skills Desire for Improvement Physical Health Resilience Social Support Talents/Skills  ADL's:  Intact  Cognition: WNL  Sleep:  Good   Screenings: PHQ2-9    Flowsheet Row Video Visit from [facility]/22/2021 [facility] Video Visit from 01/08/2021 in [facility] Video Visit from 11/06/2020 in [facility]  PHQ-2 Total Score 0 3 0  PHQ-9 Total Score -- 5 --      Flowsheet Row Video Visit from 03/22/2021 in [facility] Video Visit from 01/08/2021 in [facility] Video Visit from 11/06/2020 [facility]  C-SSRS RISK CATEGORY No Risk No Risk No Risk        Assessment and Plan: This patient is a 40 year old female with a history of depression and anxiety.  She is doing much better with the methylphenidate which is helping with her focus and concentration.  She will continue methylphenidate 20 mg twice daily.  She will also continue Xanax 1 mg 4 times daily for anxiety and Prozac 20 mg daily for  depression.  She will return to see me in 3 months   [name], MD 03/22/2021, 1:36 PM

## 2021-05-08 ENCOUNTER — Other Ambulatory Visit (HOSPITAL_COMMUNITY): Payer: Self-pay | Admitting: Psychiatry

## 2021-06-25 ENCOUNTER — Telehealth (INDEPENDENT_AMBULATORY_CARE_PROVIDER_SITE_OTHER): Payer: Medicaid Other | Admitting: Psychiatry

## 2021-06-25 ENCOUNTER — Encounter (HOSPITAL_COMMUNITY): Payer: Self-pay | Admitting: Psychiatry

## 2021-06-25 ENCOUNTER — Other Ambulatory Visit: Payer: Self-pay

## 2021-06-25 DIAGNOSIS — F331 Major depressive disorder, recurrent, moderate: Secondary | ICD-10-CM

## 2021-06-25 DIAGNOSIS — F900 Attention-deficit hyperactivity disorder, predominantly inattentive type: Secondary | ICD-10-CM

## 2021-06-25 DIAGNOSIS — F411 Generalized anxiety disorder: Secondary | ICD-10-CM | POA: Diagnosis not present

## 2021-06-25 MED ORDER — FLUOXETINE HCL 40 MG PO CAPS: 40.0000 mg | ORAL_CAPSULE | Freq: Every day | ORAL | 2 refills | Status: DC

## 2021-06-25 MED ORDER — ALPRAZOLAM 1 MG PO TABS: 1.0000 mg | ORAL_TABLET | Freq: Four times a day (QID) | ORAL | 2 refills | Status: DC

## 2021-06-25 MED ORDER — METHYLPHENIDATE HCL 20 MG PO TABS: 20.0000 mg | ORAL_TABLET | Freq: Two times a day (BID) | ORAL | 0 refills | Status: DC

## 2021-06-25 NOTE — Progress Notes (Signed)
Virtual Visit via Video Note  I connected with Tina Weber on 06/25/21 at  2:20 PM EST by a video enabled telemedicine application and verified that I am speaking with the correct person using two identifiers.  Location: Patient: home Provider: office   I discussed the limitations of evaluation and management by telemedicine and the availability of in person appointments. The patient expressed understanding and agreed to proceed.      I discussed the assessment and treatment plan with the patient. The patient was provided an opportunity to ask questions and all were answered. The patient agreed with the plan and demonstrated an understanding of the instructions.   The patient was advised to call back or seek an in-person evaluation if the symptoms worsen or if the condition fails to improve as anticipated.  I provided 15 minutes of non-face-to-face time during this encounter.   [name], MD  [facility] MD/PA/NP OP Progress Note  06/25/2021 2:45 PM Tina Weber  MRN:  [id number]  Chief Complaint:  Chief Complaint   Depression; Anxiety; Follow-up    HPI: This patient is a 40 year old divorced white female who lives with her 70-year-old daughter in [place].  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  She states she has been very stressed at work lately.  She is putting a lot of extra hours developing new systems and even was granted an award in a trip to [place].  However the promised raise has not materialized.  She is very upset and disappointed about this.  She is looking around for something else.  She has been feeling down about it more depressed low energy anhedonia loss of interest in sexual activity.  I suggested that we go up on the Prozac at least for short time to see if we can pull her out of this and she agrees.  Her anxiety is under good control with the  Xanax.  She is sleeping well and rarely uses the trazodone. Visit Diagnosis:    ICD-10-CM   1. Moderate episode of recurrent major depressive disorder (HCC)  F33.1     2. Generalized anxiety disorder  F41.1     3. 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  She also had ADHD as a child  Past Medical History:  Past Medical History:  Diagnosis Date   Anxiety    Depression    Migraines    Seizures (HCC)    Last seizure almost one year ago   Supervision of normal pregnancy in first trimester 07/19/2014    [facility] marenda accardi 40 yo wm 1st Dating By LMP and [place] Pap  07/19/14 GC/CT Initial:                36+wks: Genetic Screen NT/IT:  CF screen  Anatomic [place]  Flu vaccine  Tdap Recommended ~ 28wks Glucose Screen  2 hr GBS  Feed Preference  Contraception  Circumcision  Childbirth Classes  Pediatrician      Past Surgical History:  Procedure Laterality Date   CHOLECYSTECTOMY     WISDOM TOOTH EXTRACTION      Family Psychiatric History: see below Family History:  Family History  Problem Relation Age of Onset   Congestive Heart Failure Maternal Grandmother    Diabetes Maternal Grandfather    Hypertension Father    Anxiety disorder Father    Cancer Mother        thyroid   Anxiety disorder Mother     Social History:  Social History   Socioeconomic History   Marital status: Married    Spouse name: Not on file   Number of children: Not on file   Years of education: Not on file   Highest education level: Not on file  Occupational History   Not on file  Tobacco Use   Smoking status: Never   Smokeless tobacco: Never  Substance and Sexual Activity   Alcohol use: No    Comment: 01-05-16 per pt no    Drug use: No    Comment: 01-05-16 per pt no   Sexual  activity: Never    Birth control/protection: None  Other Topics Concern   Not on file  Social History Narrative   Not on file   Social Determinants of Health   Financial Resource Strain: Not on file  Food Insecurity: Not on file  Transportation Needs: Not on file  Physical Activity: Not on file  Stress: Not on file  Social Connections: Not on file    Allergies: No Known Allergies  Metabolic Disorder Labs: No results found for: HGBA1C, MPG No results found for: PROLACTIN No results found for: CHOL, TRIG, HDL, CHOLHDL, VLDL, LDLCALC Lab Results  Component Value Date   TSH 0.674 07/19/2014    Therapeutic Level Labs: No results found for: LITHIUM No results found for: VALPROATE No components found for:  CBMZ  Current Medications: Current Outpatient Medications  Medication Sig Dispense Refill   FLUoxetine (PROZAC) 40 MG capsule Take 1 capsule (40 mg total) by mouth daily. 30 capsule 2   ALPRAZolam (XANAX) 1 MG tablet Take 1 tablet (1 mg total) by mouth in the morning, at noon, in the evening, and at bedtime. 120 tablet 2   methylphenidate (RITALIN) 20 MG tablet Take 1 tablet (20 mg total) by mouth 2 (two) times daily with breakfast and lunch. 60 tablet 0   methylphenidate (RITALIN) 20 MG tablet Take 1 tablet (20 mg total) by mouth 2 (two) times daily with breakfast and lunch. 60 tablet 0   methylphenidate (RITALIN) 20 MG tablet Take 1 tablet (20 mg total) by mouth 2 (two) times daily with breakfast and lunch. 60 tablet 0   PARAGARD INTRAUTERINE COPPER IU by Intrauterine route.     traZODone (DESYREL) 150 MG tablet Take 1 tablet (150 mg total) by mouth at bedtime. 30 tablet 2   No current facility-administered medications for this visit.     Musculoskeletal: Strength & Muscle Tone: within normal limits Gait & Station: normal Patient leans: N/A  Psychiatric Specialty Exam: Review of Systems  Psychiatric/Behavioral:  Positive for dysphoric mood.   All other systems  reviewed and are negative.  There were no vitals taken for this visit.There is no height or weight on file to calculate BMI.  General Appearance: Casual and Fairly Groomed  Eye Contact:  Good  Speech:  Clear and Coherent  Volume:  Normal  Mood:  Dysphoric  Affect:  Depressed  Thought Process:  Goal Directed  Orientation:  Full (Time, Place, and Person)  Thought Content: Rumination   Suicidal Thoughts:  No  Homicidal Thoughts:  No  Memory:  Immediate;   Good Recent;   Good  Remote;   Good  Judgement:  Good  Insight:  Good  Psychomotor Activity:  Decreased  Concentration:  Concentration: Good and Attention Span: Good  Recall:  Good  Fund of Knowledge: Good  Language: Good  Akathisia:  No  Handed:  Right  AIMS (if indicated): not done  Assets:  Communication Skills Desire for Improvement Physical Health Resilience Social Support Talents/Skills  ADL's:  Intact  Cognition: WNL  Sleep:  Good   Screenings: PHQ2-9    Flowsheet Row [facility] Video Visit from 03/22/2021 in [facility] Video Visit from 01/08/2021 in [facility] Video Visit from 11/06/2020 in [facility]  PHQ-2 Total Score 3 0 3 0  PHQ-9 Total Score 7 -- 5 --      Flowsheet Row Video Visit from 06/25/2021 in [facility] Video Visit from 03/22/2021 in [facility] Video Visit from 01/08/2021 in [facility]  C-SSRS RISK CATEGORY No Risk No Risk No Risk        Assessment and Plan: This patient is a 40 year old female with a history of depression and anxiety.  She has been more depressed due to the stress at work.  We will therefore increase Prozac from 20 to 40 mg daily.  She will continue  methylphenidate 20 mg twice daily for focus and Xanax 1 mg 4 times daily for anxiety.  She will return to see me in 4 weeks and she will reinstate therapy here.   [name], MD 06/25/2021, 2:45 PM

## 2021-07-24 ENCOUNTER — Encounter (HOSPITAL_COMMUNITY): Payer: Self-pay | Admitting: Psychiatry

## 2021-07-24 ENCOUNTER — Telehealth (INDEPENDENT_AMBULATORY_CARE_PROVIDER_SITE_OTHER): Payer: Medicaid Other | Admitting: Psychiatry

## 2021-07-24 ENCOUNTER — Other Ambulatory Visit: Payer: Self-pay

## 2021-07-24 DIAGNOSIS — F900 Attention-deficit hyperactivity disorder, predominantly inattentive type: Secondary | ICD-10-CM

## 2021-07-24 DIAGNOSIS — F411 Generalized anxiety disorder: Secondary | ICD-10-CM | POA: Diagnosis not present

## 2021-07-24 DIAGNOSIS — F331 Major depressive disorder, recurrent, moderate: Secondary | ICD-10-CM

## 2021-07-24 MED ORDER — METHYLPHENIDATE HCL 20 MG PO TABS: 20.0000 mg | ORAL_TABLET | Freq: Two times a day (BID) | ORAL | 0 refills | Status: DC

## 2021-07-24 MED ORDER — ALPRAZOLAM 1 MG PO TABS: 1.0000 mg | ORAL_TABLET | Freq: Four times a day (QID) | ORAL | 2 refills | Status: DC

## 2021-07-24 MED ORDER — FLUOXETINE HCL 40 MG PO CAPS: 40.0000 mg | ORAL_CAPSULE | Freq: Every day | ORAL | 2 refills | Status: DC
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
## 2021-07-25 ENCOUNTER — Other Ambulatory Visit: Payer: Self-pay

## 2021-07-25 ENCOUNTER — Encounter (HOSPITAL_COMMUNITY): Payer: Self-pay | Admitting: Psychiatry

## 2021-07-25 ENCOUNTER — Ambulatory Visit (INDEPENDENT_AMBULATORY_CARE_PROVIDER_SITE_OTHER): Payer: Medicaid Other | Admitting: Psychiatry

## 2021-07-25 DIAGNOSIS — F331 Major depressive disorder, recurrent, moderate: Secondary | ICD-10-CM | POA: Diagnosis not present

## 2021-07-25 DIAGNOSIS — F411 Generalized anxiety disorder: Secondary | ICD-10-CM | POA: Diagnosis not present
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
## 2021-07-27 ENCOUNTER — Other Ambulatory Visit (HOSPITAL_COMMUNITY): Payer: Self-pay | Admitting: Psychiatry

## 2021-08-16 ENCOUNTER — Other Ambulatory Visit: Payer: Self-pay

## 2021-08-16 ENCOUNTER — Ambulatory Visit (INDEPENDENT_AMBULATORY_CARE_PROVIDER_SITE_OTHER): Payer: Medicaid Other | Admitting: Psychiatry

## 2021-08-16 DIAGNOSIS — F411 Generalized anxiety disorder: Secondary | ICD-10-CM | POA: Diagnosis not present

## 2021-08-16 DIAGNOSIS — F331 Major depressive disorder, recurrent, moderate: Secondary | ICD-10-CM | POA: Diagnosis not present
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
## 2021-09-06 ENCOUNTER — Ambulatory Visit (HOSPITAL_COMMUNITY): Payer: Medicaid Other | Admitting: Psychiatry

## 2021-09-20 ENCOUNTER — Other Ambulatory Visit: Payer: Self-pay

## 2021-09-20 ENCOUNTER — Ambulatory Visit (HOSPITAL_COMMUNITY): Payer: Medicaid Other | Admitting: Psychiatry

## 2021-09-24 ENCOUNTER — Telehealth (INDEPENDENT_AMBULATORY_CARE_PROVIDER_SITE_OTHER): Payer: Medicaid Other | Admitting: Psychiatry

## 2021-09-24 ENCOUNTER — Other Ambulatory Visit: Payer: Self-pay

## 2021-09-24 ENCOUNTER — Encounter (HOSPITAL_COMMUNITY): Payer: Self-pay | Admitting: Psychiatry

## 2021-09-24 DIAGNOSIS — F411 Generalized anxiety disorder: Secondary | ICD-10-CM

## 2021-09-24 DIAGNOSIS — F331 Major depressive disorder, recurrent, moderate: Secondary | ICD-10-CM | POA: Diagnosis not present

## 2021-09-24 DIAGNOSIS — F900 Attention-deficit hyperactivity disorder, predominantly inattentive type: Secondary | ICD-10-CM | POA: Diagnosis not present

## 2021-09-24 MED ORDER — ALPRAZOLAM 1 MG PO TABS: 1.0000 mg | ORAL_TABLET | Freq: Four times a day (QID) | ORAL | 2 refills | Status: DC

## 2021-09-24 MED ORDER — FLUOXETINE HCL 10 MG PO CAPS: 10.0000 mg | ORAL_CAPSULE | Freq: Every day | ORAL | 2 refills | Status: DC

## 2021-09-24 MED ORDER — METHYLPHENIDATE HCL 20 MG PO TABS: 20.0000 mg | ORAL_TABLET | Freq: Two times a day (BID) | ORAL | 0 refills | Status: DC

## 2021-09-24 MED ORDER — FLUOXETINE HCL 20 MG PO CAPS: 20.0000 mg | ORAL_CAPSULE | Freq: Every day | ORAL | 2 refills | Status: DC

## 2021-09-24 NOTE — Progress Notes (Signed)
Virtual Visit via Video Note  I connected with Tina Weber on 09/24/21 at  2:20 PM EST by a video enabled telemedicine application and verified that I am speaking with the correct person using two identifiers.  Location: Patient: home Provider: office   I discussed the limitations of evaluation and management by telemedicine and the availability of in person appointments. The patient expressed understanding and agreed to proceed.      I discussed the assessment and treatment plan with the patient. The patient was provided an opportunity to ask questions and all were answered. The patient agreed with the plan and demonstrated an understanding of the instructions.   The patient was advised to call back or seek an in-person evaluation if the symptoms worsen or if the condition fails to improve as anticipated.  I provided 15 minutes of non-face-to-face time during this encounter.   Tina Ruder, MD  [facility] MD/PA/NP OP Progress Note  09/24/2021 2:39 PM Tina Weber  MRN:  [id number]  Chief Complaint:  Chief Complaint   Anxiety; Depression; Follow-up    HPI: This patient is a 41 year old divorced white female who lives with her 40-year-old daughter in [place].  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  She states overall she is doing "okay."  However she feels like she is just going through life refilling responsibilities.  She does not feel much joy and feels like everything is somewhat blunted.  Part of this is that her job is gotten very difficult as it was brought up by bigger company and they are more demands on her.  She went up for a promotion but did not get it and they hired someone from the outside.  This made her feel diminished.  The patient denies any thoughts of suicide or self-harm.  Her anxiety is under better control.  She is sleeping well.  I suggest  that we go down on the Prozac just a little bit to see if we can alleviate the blunted feeling.  She is willing to try the 30 mg.  The methylphenidate continues to help with her focus Visit Diagnosis:    ICD-10-CM   1. Moderate episode of recurrent major depressive disorder (HCC)  F33.1     2. Generalized anxiety disorder  F41.1     3. 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  She also had ADHD as a child  Past Medical History:  Past Medical History:  Diagnosis Date   Anxiety    Depression    Migraines    Seizures (HCC)    Last seizure almost one year ago   Supervision of normal pregnancy in first trimester 07/19/2014    [facility] masha orbach 41 yo wm 1st Dating By LMP and [place] Pap  07/19/14 GC/CT Initial:                36+wks: Genetic Screen NT/IT:  CF screen  Anatomic [place]  Flu vaccine  Tdap Recommended ~ 28wks Glucose Screen  2 hr GBS  Feed Preference  Contraception  Circumcision  Childbirth Classes  Pediatrician      Past Surgical History:  Procedure Laterality  Date   CHOLECYSTECTOMY     WISDOM TOOTH EXTRACTION      Family Psychiatric History: see below  Family History:  Family History  Problem Relation Age of Onset   Congestive Heart Failure Maternal Grandmother    Diabetes Maternal Grandfather    Hypertension Father    Anxiety disorder Father    Cancer Mother        thyroid   Anxiety disorder Mother     Social History:  Social History   Socioeconomic History   Marital status: Divorced    Spouse name: Not on file   Number of children: Not on file   Years of education: Not on file   Highest education level: Not on file  Occupational History   Not on file  Tobacco Use   Smoking status: Never   Smokeless tobacco: Never  Vaping Use    Vaping Use: Never used  Substance and Sexual Activity   Alcohol use: No    Comment: glass of wine on weekends sometimes   Drug use: No    Comment: 01-05-16 per pt no   Sexual activity: Yes    Birth control/protection: I.U.D.  Other Topics Concern   Not on file  Social History Narrative   Not on file   Social Determinants of Health   Financial Resource Strain: Not on file  Food Insecurity: Not on file  Transportation Needs: Not on file  Physical Activity: Not on file  Stress: Not on file  Social Connections: Not on file    Allergies: No Known Allergies  Metabolic Disorder Labs: No results found for: HGBA1C, MPG No results found for: PROLACTIN No results found for: CHOL, TRIG, HDL, CHOLHDL, VLDL, LDLCALC Lab Results  Component Value Date   TSH 0.674 07/19/2014    Therapeutic Level Labs: No results found for: LITHIUM No results found for: VALPROATE No components found for:  CBMZ  Current Medications: Current Outpatient Medications  Medication Sig Dispense Refill   FLUoxetine (PROZAC) 10 MG capsule Take 1 capsule (10 mg total) by mouth daily. 90 capsule 2   FLUoxetine (PROZAC) 20 MG capsule Take 1 capsule (20 mg total) by mouth daily. 90 capsule 2   ALPRAZolam (XANAX) 1 MG tablet Take 1 tablet (1 mg total) by mouth in the morning, at noon, in the evening, and at bedtime. 120 tablet 2   methylphenidate (RITALIN) 20 MG tablet Take 1 tablet (20 mg total) by mouth 2 (two) times daily with breakfast and lunch. 60 tablet 0   methylphenidate (RITALIN) 20 MG tablet Take 1 tablet (20 mg total) by mouth 2 (two) times daily with breakfast and lunch. 60 tablet 0   methylphenidate (RITALIN) 20 MG tablet Take 1 tablet (20 mg total) by mouth 2 (two) times daily with breakfast and lunch. 60 tablet 0   PARAGARD INTRAUTERINE COPPER IU by Intrauterine route.     No current facility-administered medications for this visit.     Musculoskeletal: Strength & Muscle Tone: within normal  limits Gait & Station: normal Patient leans: N/A  Psychiatric Specialty Exam: Review of Systems  All other systems reviewed and are negative.  There were no vitals taken for this visit.There is no height or weight on file to calculate BMI.  General Appearance: Casual and Fairly Groomed  Eye Contact:  Good  Speech:  Clear and Coherent  Volume:  Normal  Mood:  Euthymic  Affect:  Appropriate and Congruent  Thought Process:  Goal Directed  Orientation:  Full (Time, Place,  and Person)  Thought Content: Rumination   Suicidal Thoughts:  No  Homicidal Thoughts:  No  Memory:  Immediate;   Good Recent;   Good Remote;   Good  Judgement:  Good  Insight:  Good  Psychomotor Activity:  Normal  Concentration:  Concentration: Good and Attention Span: Good  Recall:  Good  Fund of Knowledge: Good  Language: Good  Akathisia:  No  Handed:  Right  AIMS (if indicated): not done  Assets:  Communication Skills Desire for Improvement Physical Health Resilience Social Support Talents/Skills  ADL's:  Intact  Cognition: WNL  Sleep:  Good   Screenings: PHQ2-9    Flowsheet Row [facility] Counselor from 07/25/2021 in [facility] Video Visit from 07/24/2021 in [facility] Video Visit from 06[facility]25/2021 in [facility] Video Visit from 03/22/2021 in [facility]  PHQ-2 Total Score 2 4 1 3  0  PHQ-9 Total Score 5 15 -- 7 --      Flowsheet Row Video Visit from 09/24/2021 in [facility] Counselor from 07/25/2021 [facility] Video Visit from 07/24/2021 [facility]  C-SSRS RISK CATEGORY No Risk No Risk No Risk        Assessment and Plan:  This patient is a 41 year old female with a history of depression and anxiety.  She is generally doing okay and managing everything well but she feels blunted.  We will therefore decrease Prozac from 40 to 30 mg daily for depression.  She will continue methylphenidate 20 mg twice daily for focus and Xanax 1 mg 4 times daily for anxiety.  She will return to see me in 6 weeks   Tina Ruder, MD 09/24/2021, 2:39 PM

## 2021-10-04 ENCOUNTER — Ambulatory Visit (INDEPENDENT_AMBULATORY_CARE_PROVIDER_SITE_OTHER): Payer: Medicaid Other | Admitting: Psychiatry

## 2021-10-04 ENCOUNTER — Encounter (HOSPITAL_COMMUNITY): Payer: Self-pay

## 2021-10-04 ENCOUNTER — Other Ambulatory Visit: Payer: Self-pay

## 2021-10-04 DIAGNOSIS — F411 Generalized anxiety disorder: Secondary | ICD-10-CM

## 2021-10-04 DIAGNOSIS — F331 Major depressive disorder, recurrent, moderate: Secondary | ICD-10-CM
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
## 2021-10-04 NOTE — Plan of Care (Signed)
Patient participated in development of plan. °

## 2021-11-26 ENCOUNTER — Encounter (HOSPITAL_COMMUNITY): Payer: Self-pay | Admitting: Psychiatry

## 2021-11-26 ENCOUNTER — Telehealth (INDEPENDENT_AMBULATORY_CARE_PROVIDER_SITE_OTHER): Payer: Medicaid Other | Admitting: Psychiatry

## 2021-11-26 DIAGNOSIS — F411 Generalized anxiety disorder: Secondary | ICD-10-CM

## 2021-11-26 DIAGNOSIS — F331 Major depressive disorder, recurrent, moderate: Secondary | ICD-10-CM

## 2021-11-26 DIAGNOSIS — F900 Attention-deficit hyperactivity disorder, predominantly inattentive type: Secondary | ICD-10-CM

## 2021-11-26 MED ORDER — FLUOXETINE HCL 20 MG PO CAPS: 20.0000 mg | ORAL_CAPSULE | Freq: Every day | ORAL | 2 refills | Status: DC

## 2021-11-26 MED ORDER — FLUOXETINE HCL 10 MG PO CAPS: 10.0000 mg | ORAL_CAPSULE | Freq: Every day | ORAL | 2 refills | Status: DC

## 2021-11-26 MED ORDER — ALPRAZOLAM 1 MG PO TABS: 1.0000 mg | ORAL_TABLET | Freq: Four times a day (QID) | ORAL | 2 refills | Status: DC

## 2021-11-26 MED ORDER — METHYLPHENIDATE HCL ER (OSM) 36 MG PO TBCR: 36.0000 mg | EXTENDED_RELEASE_TABLET | Freq: Every day | ORAL | 0 refills | Status: DC

## 2021-11-26 NOTE — Progress Notes (Signed)
Virtual Visit via Video Note ? ?I connected with Tina Weber on 11/26/21 at  2:40 PM EDT by a video enabled telemedicine application and verified that I am speaking with the correct person using two identifiers. ? ?Location: ?Patient: home ?Provider: office ?  ?I discussed the limitations of evaluation and management by telemedicine and the availability of in person appointments. The patient expressed understanding and agreed to proceed. ? ? ?  ?I discussed the assessment and treatment plan with the patient. The patient was provided an opportunity to ask questions and all were answered. The patient agreed with the plan and demonstrated an understanding of the instructions. ?  ?The patient was advised to call back or seek an in-person evaluation if the symptoms worsen or if the condition fails to improve as anticipated. ? ?I provided 15 minutes of non-face-to-face time during this encounter. ? ? ?Levonne Spiller, MD ? ?BH MD/PA/NP OP Progress Note ? ?11/26/2021 3:03 PM ?Tina Weber  ?MRN:  [id number] ? ?Chief Complaint:  ?Chief Complaint  ?Patient presents with  ? Anxiety  ? Depression  ? Follow-up  ? ?HPI: This patient is a 41 year old divorced white female who lives with her 68-year-old daughter in [place].  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 ?Returns for follow-up after 2 months.  Overall she is doing fairly well.  She tends to worry excessively about her daughter particularly when she is with her father.  She even worries however when the child is with her.  She always expects the worst to happen.  She does think the medications have helped particularly the Xanax.  She is doing fairly well at her job.  She does find that the methylphenidate wears off fairly quickly.  I suggest that we move to a longer acting medication such as Concerta and she agrees. ?Visit Diagnosis:  ?  ICD-10-CM   ?1. Moderate episode of recurrent major depressive  disorder (HCC)  F33.1   ?  ?2. Generalized anxiety disorder  F41.1   ?  ?3. Attention deficit hyperactivity disorder (ADHD), predominantly inattentive type  F90.0   ?  ? ? 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  She also had ADHD as a child ? ?Past Medical History:  ?Past Medical History:  ?Diagnosis Date  ? Anxiety   ? Depression   ? Migraines   ? Seizures ([place])   ? Last seizure almost one year ago  ? Supervision of normal pregnancy in first trimester 07/19/2014  ?  [place] 41 yo wm 1st Dating By LMP and [place] Pap  07/19/14 GC/CT Initial:                36+wks: Genetic Screen NT/IT:  CF screen  Anatomic [place]  Flu vaccine  Tdap Recommended ~ 28wks Glucose Screen  2 hr GBS  Feed Preference  Contraception  Circumcision  Childbirth Classes  Pediatrician    ?  ?Past Surgical History:  ?Procedure Laterality Date  ? CHOLECYSTECTOMY    ? WISDOM TOOTH EXTRACTION    ? ? ?Family Psychiatric History: see below ? ?Family History:  ?Family History  ?Problem Relation Age of Onset  ? Congestive Heart Failure Maternal Grandmother   ? Diabetes Maternal Grandfather   ? Hypertension Father   ? Anxiety disorder  Father   ? Cancer Mother   ?     thyroid  ? Anxiety disorder Mother   ? ? ?Social History:  ?Social History  ? ?Socioeconomic History  ? Marital status: Divorced  ?  Spouse name: Not on file  ? Number of children: Not on file  ? Years of education: Not on file  ? Highest education level: Not on file  ?Occupational History  ? Not on file  ?Tobacco Use  ? Smoking status: Never  ? Smokeless tobacco: Never  ?Vaping Use  ? Vaping Use: Never used  ?Substance and Sexual Activity  ? Alcohol use: No  ?  Comment: glass of wine on weekends sometimes  ? Drug use: No  ?  Comment: 01-05-16 per pt no  ? Sexual activity: Yes  ?  Birth control/protection: I.U.D.  ?Other Topics Concern  ? Not on file   ?Social History Narrative  ? Not on file  ? ?Social Determinants of Health  ? ?Financial Resource Strain: Not on file  ?Food Insecurity: Not on file  ?Transportation Needs: Not on file  ?Physical Activity: Not on file  ?Stress: Not on file  ?Social Connections: Not on file  ? ? ?Allergies: No Known Allergies ? ?Metabolic Disorder Labs: ?No results found for: HGBA1C, MPG ?No results found for: PROLACTIN ?No results found for: CHOL, TRIG, HDL, CHOLHDL, VLDL, LDLCALC ?Lab Results  ?Component Value Date  ? TSH 0.674 07/19/2014  ? ? ?Therapeutic Level Labs: ?No results found for: LITHIUM ?No results found for: VALPROATE ?No components found for:  CBMZ ? ?Current Medications: ?Current Outpatient Medications  ?Medication Sig Dispense Refill  ? methylphenidate (CONCERTA) 36 MG PO CR tablet Take 1 tablet (36 mg total) by mouth daily. 30 tablet 0  ? ALPRAZolam (XANAX) 1 MG tablet Take 1 tablet (1 mg total) by mouth in the morning, at noon, in the evening, and at bedtime. 120 tablet 2  ? FLUoxetine (PROZAC) 10 MG capsule Take 1 capsule (10 mg total) by mouth daily. 90 capsule 2  ? FLUoxetine (PROZAC) 20 MG capsule Take 1 capsule (20 mg total) by mouth daily. 90 capsule 2  ? methylphenidate (RITALIN) 20 MG tablet Take 1 tablet (20 mg total) by mouth 2 (two) times daily with breakfast and lunch. 60 tablet 0  ? methylphenidate (RITALIN) 20 MG tablet Take 1 tablet (20 mg total) by mouth 2 (two) times daily with breakfast and lunch. 60 tablet 0  ? methylphenidate (RITALIN) 20 MG tablet Take 1 tablet (20 mg total) by mouth 2 (two) times daily with breakfast and lunch. 60 tablet 0  ? PARAGARD INTRAUTERINE COPPER IU by Intrauterine route.    ? ?No current facility-administered medications for this visit.  ? ? ? ?Musculoskeletal: ?Strength & Muscle Tone: within normal limits ?Gait & Station: normal ?Patient leans: N/A ? ?Psychiatric Specialty Exam: ?Review of Systems  ?Psychiatric/Behavioral:  Positive for decreased concentration. The  patient is nervous/anxious.   ?All other systems reviewed and are negative.  ?There were no vitals taken for this visit.There is no height or weight on file to calculate BMI.  ?General Appearance: Casual, Neat, and Well Groomed  ?Eye Contact:  Good  ?Speech:  Clear and Coherent  ?Volume:  Normal  ?Mood:  Anxious and Euthymic  ?Affect:  Congruent  ?Thought Process:  Goal Directed  ?Orientation:  Full (Time, Place, and Person)  ?Thought Content: WDL   ?Suicidal Thoughts:  No  ?Homicidal Thoughts:  no  ?Memory:  Immediate;  Good ?Recent;   Good ?Remote;   Good  ?Judgement:  Good  ?Insight:  Good  ?Psychomotor Activity:  Normal  ?Concentration:  Concentration: Fair and Attention Span: Fair  ?Recall:  Good  ?Fund of Knowledge: Good  ?Language: Good  ?Akathisia:  No  ?Handed:  Right  ?AIMS (if indicated): not done  ?Assets:  Communication Skills ?Desire for Improvement ?Physical Health ?Resilience ?Social Support ?Talents/Skills  ?ADL's:  Intact  ?Cognition: WNL  ?Sleep:  Fair  ? ?Screenings: ?PHQ2-9   ? ?Flowsheet Row Video Visit from 11/26/2021 in [facility] Video Visit from 09/24/2021 in [facility] from 07/25/2021 in [facility] Video Visit from 07/24/2021 in [facility] Video Visit from 06/25/2021 in [facility]  ?PHQ-2 Total Score 1 2 4 1 3   ?PHQ-9 Total Score -- 5 15 -- 7  ? ?  ? ?Flowsheet Row Video Visit from 11/26/2021 in [facility] Video Visit from 09/24/2021 in [facility] from 07/25/2021 in [facility]  ?C-SSRS RISK CATEGORY No Risk No Risk No Risk  ? ?  ? ? ? ?Assessment and Plan: This patient is a 41 year old female with a history of depression anxiety and  ADD.  She is having difficulties with the methylphenidate wearing off in the middle of the day.  We will therefore switch to Concerta 36 mg every morning which is longer acting.  She will continue Prozac

## 2021-12-13 ENCOUNTER — Telehealth (HOSPITAL_COMMUNITY): Payer: Self-pay

## 2021-12-13 NOTE — Telephone Encounter (Signed)
Medication management - Telephone message left for patient, that her message was received requesting Dr. Harrington Challenger consider going up a little more on her methylphenidate (Concerta) 36 mg recently started. Informed Dr.Ross was out this week but would send request to her that she would like to try taking a little more to assist with her focus.  Requested patient call our office back if any issues prior to Dr. Harrington Challenger return. ?

## 2021-12-16 ENCOUNTER — Other Ambulatory Visit (HOSPITAL_COMMUNITY): Payer: Self-pay | Admitting: Psychiatry

## 2021-12-16 MED ORDER — METHYLPHENIDATE HCL ER (OSM) 54 MG PO TBCR: 54.0000 mg | EXTENDED_RELEASE_TABLET | ORAL | 0 refills | Status: DC

## 2021-12-16 NOTE — Telephone Encounter (Signed)
Tell her concerta 54 mg sent in

## 2021-12-17 NOTE — Telephone Encounter (Signed)
LMOM

## 2021-12-18 ENCOUNTER — Ambulatory Visit (INDEPENDENT_AMBULATORY_CARE_PROVIDER_SITE_OTHER): Payer: Medicaid Other | Admitting: Psychiatry

## 2021-12-18 DIAGNOSIS — F331 Major depressive disorder, recurrent, moderate: Secondary | ICD-10-CM

## 2021-12-18 DIAGNOSIS — F411 Generalized anxiety disorder: Secondary | ICD-10-CM | POA: Diagnosis not present
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
## 2021-12-20 NOTE — Telephone Encounter (Signed)
Informed patient and she is aware ?

## 2022-01-01 ENCOUNTER — Ambulatory Visit (HOSPITAL_COMMUNITY): Payer: Medicaid Other | Admitting: Psychiatry

## 2022-01-15 ENCOUNTER — Ambulatory Visit (HOSPITAL_COMMUNITY): Payer: Medicaid Other | Admitting: Psychiatry

## 2022-01-16 ENCOUNTER — Encounter (HOSPITAL_COMMUNITY): Payer: Self-pay | Admitting: Psychiatry

## 2022-01-16 ENCOUNTER — Telehealth (INDEPENDENT_AMBULATORY_CARE_PROVIDER_SITE_OTHER): Payer: Medicaid Other | Admitting: Psychiatry

## 2022-01-16 ENCOUNTER — Other Ambulatory Visit (HOSPITAL_COMMUNITY): Payer: Self-pay | Admitting: Psychiatry

## 2022-01-16 DIAGNOSIS — F331 Major depressive disorder, recurrent, moderate: Secondary | ICD-10-CM | POA: Diagnosis not present

## 2022-01-16 DIAGNOSIS — F411 Generalized anxiety disorder: Secondary | ICD-10-CM

## 2022-01-16 DIAGNOSIS — F900 Attention-deficit hyperactivity disorder, predominantly inattentive type: Secondary | ICD-10-CM

## 2022-01-16 MED ORDER — FLUOXETINE HCL 40 MG PO CAPS: 40.0000 mg | ORAL_CAPSULE | Freq: Every day | ORAL | 2 refills | Status: DC

## 2022-01-16 MED ORDER — TRAZODONE HCL 50 MG PO TABS: 50.0000 mg | ORAL_TABLET | Freq: Every day | ORAL | 2 refills | Status: DC

## 2022-01-16 MED ORDER — ALPRAZOLAM 1 MG PO TABS: 1.0000 mg | ORAL_TABLET | Freq: Four times a day (QID) | ORAL | 2 refills | Status: DC

## 2022-01-16 MED ORDER — METHYLPHENIDATE HCL 20 MG PO TABS: 20.0000 mg | ORAL_TABLET | Freq: Two times a day (BID) | ORAL | 0 refills | Status: DC

## 2022-01-16 NOTE — Progress Notes (Signed)
Virtual Visit via Telephone Note  I connected with Tina Weber on 01/16/22 at  1:00 PM EDT by telephone and verified that I am speaking with the correct person using two identifiers.  Location: Patient: home Provider: office   I discussed the limitations, risks, security and privacy concerns of performing an evaluation and management service by telephone and the availability of in person appointments. I also discussed with the patient that there may be a patient responsible charge related to this service. The patient expressed understanding and agreed to proceed.      I discussed the assessment and treatment plan with the patient. The patient was provided an opportunity to ask questions and all were answered. The patient agreed with the plan and demonstrated an understanding of the instructions.   The patient was advised to call back or seek an in-person evaluation if the symptoms worsen or if the condition fails to improve as anticipated.  I provided 15 minutes of non-face-to-face time during this encounter.   Tina Ruder, MD  [facility] MD/PA/NP OP Progress Note  01/16/2022 1:19 PM [name]  MRN:  [id number]  Chief Complaint:  Chief Complaint  Patient presents with   Anxiety   Depression   ADHD   Follow-up   HPI: This patient is a 41 year old divorced white female who lives with her 16-year-old daughter in [place].  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  Last time we switch from methylphenidate to longer acting Concerta.  She really does not like this because it does not feel like she is taking anything at all.  She request to go back to the methylphenidate.  She also stated that the pharmacy inadvertently filled her Prozac for 40 mg and she is gone back to this higher dose.  She states that she feels much better on it and less depressed.  The therapy is  helping her a good deal to not be so hard on herself.  She does think all of her medications are helping.  She continues to use Xanax to help anxiety and trazodone 25 to 50 mg to help with sleep.  She denies thoughts of self-harm or suicidal ideation Visit Diagnosis:    ICD-10-CM   1. Moderate episode of recurrent major depressive disorder (HCC)  F33.1     2. Generalized anxiety disorder  F41.1     3. 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  She also had ADHD as a child  Past Medical History:  Past Medical History:  Diagnosis Date   Anxiety    Depression    Migraines    Seizures (HCC)    Last seizure almost one year ago   Supervision of normal pregnancy in first trimester 07/19/2014    [facility] trinadee verhagen 41 yo wm 1st Dating By LMP and [place] Pap  07/19/14 GC/CT Initial:                36+wks: Genetic Screen NT/IT:  CF screen  Anatomic [place]  Flu vaccine  Tdap Recommended ~ 28wks Glucose Screen  2 hr GBS  Feed Preference  Contraception  Circumcision  Childbirth Classes  Pediatrician  Past Surgical History:  Procedure Laterality Date   CHOLECYSTECTOMY     WISDOM TOOTH EXTRACTION      Family Psychiatric History: see below  Family History:  Family History  Problem Relation Age of Onset   Congestive Heart Failure Maternal Grandmother    Diabetes Maternal Grandfather    Hypertension Father    Anxiety disorder Father    Cancer Mother        thyroid   Anxiety disorder Mother     Social History:  Social History   Socioeconomic History   Marital status: Divorced    Spouse name: Not on file   Number of children: Not on file   Years of education: Not on file   Highest education level: Not on file  Occupational History   Not on file  Tobacco Use    Smoking status: Never   Smokeless tobacco: Never  Vaping Use   Vaping Use: Never used  Substance and Sexual Activity   Alcohol use: No    Comment: glass of wine on weekends sometimes   Drug use: No    Comment: 01-05-16 per pt no   Sexual activity: Yes    Birth control/protection: I.U.D.  Other Topics Concern   Not on file  Social History Narrative   Not on file   Social Determinants of Health   Financial Resource Strain: Not on file  Food Insecurity: Not on file  Transportation Needs: Not on file  Physical Activity: Not on file  Stress: Not on file  Social Connections: Not on file    Allergies: No Known Allergies  Metabolic Disorder Labs: No results found for: HGBA1C, MPG No results found for: PROLACTIN No results found for: CHOL, TRIG, HDL, CHOLHDL, VLDL, LDLCALC Lab Results  Component Value Date   TSH 0.674 07/19/2014    Therapeutic Level Labs: No results found for: LITHIUM No results found for: VALPROATE No components found for:  CBMZ  Current Medications: Current Outpatient Medications  Medication Sig Dispense Refill   FLUoxetine (PROZAC) 40 MG capsule Take 1 capsule (40 mg total) by mouth daily. 30 capsule 2   methylphenidate (RITALIN) 20 MG tablet Take 1 tablet (20 mg total) by mouth 2 (two) times daily with breakfast and lunch. 60 tablet 0   methylphenidate (RITALIN) 20 MG tablet Take 1 tablet (20 mg total) by mouth 2 (two) times daily with breakfast and lunch. 60 tablet 0   ALPRAZolam (XANAX) 1 MG tablet Take 1 tablet (1 mg total) by mouth in the morning, at noon, in the evening, and at bedtime. 120 tablet 2   methylphenidate (RITALIN) 20 MG tablet Take 1 tablet (20 mg total) by mouth 2 (two) times daily with breakfast and lunch. 60 tablet 0   PARAGARD INTRAUTERINE COPPER IU by Intrauterine route.     traZODone (DESYREL) 50 MG tablet Take 1 tablet (50 mg total) by mouth at bedtime. 30 tablet 2   No current facility-administered medications for this visit.      Musculoskeletal: Strength & Muscle Tone: within normal limits Gait & Station: normal Patient leans: N/A  Psychiatric Specialty Exam: Review of Systems  All other systems reviewed and are negative.  There were no vitals taken for this visit.There is no height or weight on file to calculate BMI.  General Appearance: NA  Eye Contact:  NA  Speech:  Clear and Coherent  Volume:  Normal  Mood:  Euthymic  Affect:  NA  Thought Process:  Goal Directed  Orientation:  Full (  Time, Place, and Person)  Thought Content: WDL   Suicidal Thoughts:  No  Homicidal Thoughts:  No  Memory:  Immediate;   Good Recent;   Good Remote;   Good  Judgement:  Good  Insight:  Good  Psychomotor Activity:  Normal  Concentration:  Concentration: Fair and Attention Span: Fair  Recall:  Good  Fund of Knowledge: Good  Language: Good  Akathisia:  No  Handed:  Right  AIMS (if indicated): not done  Assets:  Communication Skills Desire for Improvement Physical Health Resilience Social Support Talents/Skills  ADL's:  Intact  Cognition: WNL  Sleep:  Good   Screenings: PHQ2-9    Flowsheet Row [facility] Video Visit from 11/26/2021 in [facility] Video Visit from 09/24/2021 in [facility] Counselor from 07/25/2021 in [facility] Video Visit from 07/24/2021 in [facility]  PHQ-2 Total Score 0 1 2 4 1   PHQ-9 Total Score -- -- 5 15 --      Flowsheet Row Video Visit from 01/16/2022 in [facility] Video Visit from 11/26/2021 in [facility] Video Visit from 09/24/2021 in [facility]  C-SSRS RISK CATEGORY No Risk No Risk No Risk         Assessment and Plan: This patient is a 41 year old female with a history of depression anxiety and ADD.  She feels like she did better with regular methylphenidate so return to methylphenidate 20 mg twice daily for ADD.  She will continue Prozac 40 mg daily for depression, Xanax 1 mg 4 times daily for anxiety and trazodone 25 to 50 mg at bedtime for sleep.  She will return to see me in 3 months  Collaboration of Care: Collaboration of Care: Referral or follow-up with counselor/therapist AEB patient will continue therapy with 24 in our office  Patient/Guardian was advised Release of Information must be obtained prior to any record release in order to collaborate their care with an outside provider. Patient/Guardian was advised if they have not already done so to contact the registration department to sign all necessary forms in order for Florencia Reasons to release information regarding their care.   Consent: Patient/Guardian gives verbal consent for treatment and assignment of benefits for services provided during this visit. Patient/Guardian expressed understanding and agreed to proceed.    [place], MD 01/16/2022, 1:19 PM

## 2022-02-15 ENCOUNTER — Other Ambulatory Visit (HOSPITAL_COMMUNITY): Payer: Self-pay | Admitting: Psychiatry

## 2022-04-04 ENCOUNTER — Telehealth (HOSPITAL_COMMUNITY): Payer: Self-pay | Admitting: *Deleted

## 2022-04-04 ENCOUNTER — Other Ambulatory Visit (HOSPITAL_COMMUNITY): Payer: Self-pay | Admitting: Psychiatry

## 2022-04-04 MED ORDER — METHYLPHENIDATE HCL 20 MG PO TABS: 20.0000 mg | ORAL_TABLET | Freq: Two times a day (BID) | ORAL | 0 refills | Status: DC

## 2022-04-04 NOTE — Telephone Encounter (Signed)
Patient stated she is out of her Ritalin. Per pt the last time she picked up was 03/04/2022. Per pt she have not picked up her scripts early and would like to have provider to please refill her medication.

## 2022-04-04 NOTE — Telephone Encounter (Signed)
Sent, she needs appt 

## 2022-04-16 ENCOUNTER — Encounter (HOSPITAL_COMMUNITY): Payer: Self-pay | Admitting: Psychiatry

## 2022-04-16 ENCOUNTER — Telehealth (INDEPENDENT_AMBULATORY_CARE_PROVIDER_SITE_OTHER): Payer: Medicaid Other | Admitting: Psychiatry

## 2022-04-16 DIAGNOSIS — F331 Major depressive disorder, recurrent, moderate: Secondary | ICD-10-CM | POA: Diagnosis not present

## 2022-04-16 DIAGNOSIS — F900 Attention-deficit hyperactivity disorder, predominantly inattentive type: Secondary | ICD-10-CM

## 2022-04-16 DIAGNOSIS — F411 Generalized anxiety disorder: Secondary | ICD-10-CM

## 2022-04-16 MED ORDER — METHYLPHENIDATE HCL 20 MG PO TABS: 20.0000 mg | ORAL_TABLET | Freq: Two times a day (BID) | ORAL | 0 refills | Status: DC

## 2022-04-16 MED ORDER — ALPRAZOLAM 1 MG PO TABS: 1.0000 mg | ORAL_TABLET | Freq: Four times a day (QID) | ORAL | 2 refills | Status: DC

## 2022-04-16 MED ORDER — FLUOXETINE HCL 40 MG PO CAPS: 40.0000 mg | ORAL_CAPSULE | Freq: Every day | ORAL | 2 refills | Status: DC

## 2022-04-16 NOTE — Progress Notes (Signed)
Virtual Visit via Video Note  I connected with Tina Weber on 04/16/22 at  1:00 PM EDT by a video enabled telemedicine application and verified that I am speaking with the correct person using two identifiers.  Location: Patient: home Provider: office   I discussed the limitations of evaluation and management by telemedicine and the availability of in person appointments. The patient expressed understanding and agreed to proceed.    I discussed the assessment and treatment plan with the patient. The patient was provided an opportunity to ask questions and all were answered. The patient agreed with the plan and demonstrated an understanding of the instructions.   The patient was advised to call back or seek an in-person evaluation if the symptoms worsen or if the condition fails to improve as anticipated.  I provided 15 minutes of non-face-to-face time during this encounter.   Levonne Spiller, MD  [facility] MD/PA/NP OP Progress Note  04/16/2022 1:20 PM Tina Weber  MRN:  [id number]  Chief Complaint:  Chief Complaint  Patient presents with   Depression   Anxiety   ADD   Follow-up   HPI: This patient is a 41 year old divorced white female who lives with her 24-year-old daughter in [place].  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  She states that she is doing well.  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  She is very happy and has less stress in the new job.  She is moving to a new condo in 2 months.  She is getting along well with her boyfriend.  Her depression has diminished greatly since we increase the Prozac and her anxiety is under good control.  She is sleeping well most of the time and is staying well focused with the methylphenidate. Visit Diagnosis:    ICD-10-CM   1. Moderate episode of recurrent major depressive disorder (HCC)  F33.1     2. Generalized anxiety disorder   F41.1     3. Attention deficit hyperactivity disorder (ADHD), predominantly inattentive type  F90.0       Past Psychiatric History:  Past history of depression in high school as well as postpartum depression after the birth of her daughter which responded to Prozac.  She also had ADHD as a child  Past Medical History:  Past Medical History:  Diagnosis Date   Anxiety    Depression    Migraines    Seizures ([place])    Last seizure almost one year ago   Supervision of normal pregnancy in first trimester 07/19/2014    [place] 41 yo wm 1st Dating By LMP and [place] Pap  07/19/14 GC/CT Initial:                36+wks: Genetic Screen NT/IT:  CF screen  Anatomic [place]  Flu vaccine  Tdap Recommended ~ 28wks Glucose Screen  2 hr GBS  Feed Preference  Contraception  Circumcision  Childbirth Classes  Pediatrician      Past Surgical History:  Procedure Laterality Date   CHOLECYSTECTOMY     WISDOM TOOTH EXTRACTION      Family Psychiatric History: See below  Family History:  Family History  Problem Relation Age of Onset   Congestive Heart Failure Maternal Grandmother    Diabetes Maternal Grandfather    Hypertension Father    Anxiety disorder Father    Cancer Mother  thyroid   Anxiety disorder Mother     Social History:  Social History   Socioeconomic History   Marital status: Divorced    Spouse name: Not on file   Number of children: Not on file   Years of education: Not on file   Highest education level: Not on file  Occupational History   Not on file  Tobacco Use   Smoking status: Never   Smokeless tobacco: Never  Vaping Use   Vaping Use: Never used  Substance and Sexual Activity   Alcohol use: No    Comment: glass of wine on weekends sometimes   Drug use: No    Comment: 01-05-16 per pt no   Sexual activity: Yes    Birth control/protection: I.U.D.  Other Topics Concern   Not on file  Social History Narrative   Not on file   Social  Determinants of Health   Financial Resource Strain: Not on file  Food Insecurity: Not on file  Transportation Needs: Not on file  Physical Activity: Not on file  Stress: Not on file  Social Connections: Not on file    Allergies: No Known Allergies  Metabolic Disorder Labs: No results found for: "HGBA1C", "MPG" No results found for: "PROLACTIN" No results found for: "CHOL", "TRIG", "HDL", "CHOLHDL", "VLDL", "LDLCALC" Lab Results  Component Value Date   TSH 0.674 07/19/2014    Therapeutic Level Labs: No results found for: "LITHIUM" No results found for: "VALPROATE" No results found for: "CBMZ"  Current Medications: Current Outpatient Medications  Medication Sig Dispense Refill   ALPRAZolam (XANAX) 1 MG tablet Take 1 tablet (1 mg total) by mouth in the morning, at noon, in the evening, and at bedtime. 120 tablet 2   FLUoxetine (PROZAC) 40 MG capsule Take 1 capsule (40 mg total) by mouth daily. 30 capsule 2   methylphenidate (RITALIN) 20 MG tablet Take 1 tablet (20 mg total) by mouth 2 (two) times daily with breakfast and lunch. 60 tablet 0   methylphenidate (RITALIN) 20 MG tablet Take 1 tablet (20 mg total) by mouth 2 (two) times daily with breakfast and lunch. 60 tablet 0   methylphenidate (RITALIN) 20 MG tablet Take 1 tablet (20 mg total) by mouth 2 (two) times daily with breakfast and lunch. 60 tablet 0   PARAGARD INTRAUTERINE COPPER IU by Intrauterine route.     traZODone (DESYREL) 50 MG tablet TAKE 1 TABLET BY MOUTH EVERYDAY AT BEDTIME 90 tablet 1   No current facility-administered medications for this visit.     Musculoskeletal: Strength & Muscle Tone: within normal limits Gait & Station: normal Patient leans: N/A  Psychiatric Specialty Exam: Review of Systems  All other systems reviewed and are negative.   There were no vitals taken for this visit.There is no height or weight on file to calculate BMI.  General Appearance: Casual and Fairly Groomed  Eye Contact:   Good  Speech:  Clear and Coherent  Volume:  Normal  Mood:  Euthymic  Affect:  Appropriate and Congruent  Thought Process:  Goal Directed  Orientation:  Full (Time, Place, and Person)  Thought Content: Rumination   Suicidal Thoughts:  No  Homicidal Thoughts:  No  Memory:  Immediate;   Good Recent;   Good Remote;   Good  Judgement:  Good  Insight:  Good  Psychomotor Activity:  Normal  Concentration:  Concentration: Good and Attention Span: Good  Recall:  Good  Fund of Knowledge: Good  Language: Good  Akathisia:  No  Handed:  Right  AIMS (if indicated): not done  Assets:  Communication Skills Desire for Improvement Physical Health Resilience Social Support Talents/Skills Vocational/Educational  ADL's:  Intact  Cognition: WNL  Sleep:  Good   Screenings: PHQ2-9    Flowsheet Row Video Visit from [facility]16[facility]2022 [facility] Video Visit from 01/16/2022 in [facility] Video Visit from 11/26/2021 in [facility] Video Visit from 09/24/2021 in [facility] Counselor from 07/25/2021 in [facility]  PHQ-2 Total Score 0 0 1 2 4   PHQ-9 Total Score -- -- -- 5 15      Flowsheet Row Video Visit from 04/16/2022 in [facility] Video Visit from 01/16/2022 in [facility] Video Visit from 11/26/2021 in [facility]  C-SSRS RISK CATEGORY No Risk No Risk No Risk        Assessment and Plan: This patient is a 41 year old female with a history of depression anxiety and ADD.  She is doing very well on her current regimen.  She will continue Prozac 40 mg daily for depression, Xanax 1 mg 4 times daily for anxiety and methylphenidate 20 mg twice daily for ADD.  She only  uses trazodone 50 mg occasionally for sleep.  She will return to see me in 3 months  Collaboration of Care: Collaboration of Care: Primary Care Provider AEB notes will be shared with PCP at patient's request  Patient/Guardian was advised Release of Information must be obtained prior to any record release in order to collaborate their care with an outside provider. Patient/Guardian was advised if they have not already done so to contact the registration department to sign all necessary forms in order for 24 to release information regarding their care.   Consent: Patient/Guardian gives verbal consent for treatment and assignment of benefits for services provided during this visit. Patient/Guardian expressed understanding and agreed to proceed.    [place], MD 04/16/2022, 1:20 PM

## 2022-05-15 NOTE — Progress Notes (Signed)
No show

## 2022-06-14 ENCOUNTER — Other Ambulatory Visit (HOSPITAL_COMMUNITY): Payer: Self-pay | Admitting: Psychiatry

## 2022-07-15 ENCOUNTER — Other Ambulatory Visit (HOSPITAL_COMMUNITY): Payer: Self-pay | Admitting: Psychiatry

## 2022-07-15 ENCOUNTER — Telehealth (HOSPITAL_COMMUNITY): Payer: Self-pay

## 2022-07-15 MED ORDER — METHYLPHENIDATE HCL 20 MG PO TABS: 20.0000 mg | ORAL_TABLET | Freq: Two times a day (BID) | ORAL | 0 refills | Status: DC

## 2022-07-15 NOTE — Telephone Encounter (Signed)
Medication refill - Patient left a message she would be running out of her Ritalin 20 mg prior to appointment on 07/17/22 and requests a 1 time refill be sent into her [facility] on [address] until she can be seen. Three months of meds ordered 04/16/22.

## 2022-07-15 NOTE — Telephone Encounter (Signed)
Medication management - Message left for patient that Dr. Tenny Craw had sent in her requested new Methylphenidate 20 mg order to pt's [facility] on [address] and reminded pt of need to keep scheduled appt set for 07/17/22. Requested patient call our office back if any questions or issues.

## 2022-07-15 NOTE — Telephone Encounter (Signed)
sent 

## 2022-07-17 ENCOUNTER — Telehealth (INDEPENDENT_AMBULATORY_CARE_PROVIDER_SITE_OTHER): Payer: Medicaid Other | Admitting: Psychiatry

## 2022-07-17 ENCOUNTER — Encounter (HOSPITAL_COMMUNITY): Payer: Self-pay | Admitting: Psychiatry

## 2022-07-17 DIAGNOSIS — F331 Major depressive disorder, recurrent, moderate: Secondary | ICD-10-CM

## 2022-07-17 DIAGNOSIS — F900 Attention-deficit hyperactivity disorder, predominantly inattentive type: Secondary | ICD-10-CM | POA: Diagnosis not present

## 2022-07-17 DIAGNOSIS — F411 Generalized anxiety disorder: Secondary | ICD-10-CM

## 2022-07-17 MED ORDER — ALPRAZOLAM 1 MG PO TABS: 1.0000 mg | ORAL_TABLET | Freq: Four times a day (QID) | ORAL | 2 refills | Status: DC

## 2022-07-17 MED ORDER — FLUOXETINE HCL 40 MG PO CAPS: 40.0000 mg | ORAL_CAPSULE | Freq: Every day | ORAL | 1 refills | Status: DC

## 2022-07-17 MED ORDER — METHYLPHENIDATE HCL 20 MG PO TABS: 20.0000 mg | ORAL_TABLET | Freq: Two times a day (BID) | ORAL | 0 refills | Status: DC

## 2022-07-17 NOTE — Progress Notes (Signed)
Virtual Visit via Video Note  I connected with Tina Weber on 07/17/22 at  9:20 AM EST by a video enabled telemedicine application and verified that I am speaking with the correct person using two identifiers.  Location: Patient: home Provider: office   I discussed the limitations of evaluation and management by telemedicine and the availability of in person appointments. The patient expressed understanding and agreed to proceed.    I discussed the assessment and treatment plan with the patient. The patient was provided an opportunity to ask questions and all were answered. The patient agreed with the plan and demonstrated an understanding of the instructions.   The patient was advised to call back or seek an in-person evaluation if the symptoms worsen or if the condition fails to improve as anticipated.  I provided 15 minutes of non-face-to-face time during this encounter.   Levonne Spiller, MD  [facility] MD/PA/NP OP Progress Note  07/17/2022 9:44 AM Tina Weber  MRN:  [id number]  Chief Complaint:  Chief Complaint  Patient presents with   Depression   Anxiety   Follow-up   ADD   HPI: This patient is a 41 year old divorced white female who lives with her 67-year-old daughter in [place].  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  The patient returns for follow-up after 3 months.  She states that she is doing very well.  She is enjoying her new promotion.  She is having less stress at work.  She has learned to "let go off" the stress that she deals with with her ex-husband.  She is sleeping and eating well and her energy is good.  She is very happy and upbeat today.  She denies any recent anxiety symptoms or panic attacks or thoughts of self-harm or suicide.  She is staying well focused with the methylphenidate Visit Diagnosis:    ICD-10-CM   1. Moderate episode of recurrent major depressive disorder (HCC)  F33.1     2. Generalized anxiety  disorder  F41.1     3. 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  She also had ADHD as a child  Past Medical History:  Past Medical History:  Diagnosis Date   Anxiety    Depression    Migraines    Seizures ([place])    Last seizure almost one year ago   Supervision of normal pregnancy in first trimester 07/19/2014    [place] 41 yo wm 1st Dating By LMP and [place] Pap  07/19/14 GC/CT Initial:                36+wks: Genetic Screen NT/IT:  CF screen  Anatomic [place]  Flu vaccine  Tdap Recommended ~ 28wks Glucose Screen  2 hr GBS  Feed Preference  Contraception  Circumcision  Childbirth Classes  Pediatrician      Past Surgical History:  Procedure Laterality Date   CHOLECYSTECTOMY     WISDOM TOOTH EXTRACTION      Family Psychiatric History: See below  Family History:  Family History  Problem Relation Age of Onset   Congestive Heart Failure Maternal Grandmother    Diabetes Maternal Grandfather    Hypertension Father    Anxiety disorder Father    Cancer Mother  thyroid   Anxiety disorder Mother     Social History:  Social History   Socioeconomic History   Marital status: Divorced    Spouse name: Not on file   Number of children: Not on file   Years of education: Not on file   Highest education level: Not on file  Occupational History   Not on file  Tobacco Use   Smoking status: Never   Smokeless tobacco: Never  Vaping Use   Vaping Use: Never used  Substance and Sexual Activity   Alcohol use: No    Comment: glass of wine on weekends sometimes   Drug use: No    Comment: 01-05-16 per pt no   Sexual activity: Yes    Birth control/protection: I.U.D.  Other Topics Concern   Not on file  Social History Narrative   Not on file   Social  Determinants of Health   Financial Resource Strain: Not on file  Food Insecurity: Not on file  Transportation Needs: Not on file  Physical Activity: Not on file  Stress: Not on file  Social Connections: Not on file    Allergies: No Known Allergies  Metabolic Disorder Labs: No results found for: "HGBA1C", "MPG" No results found for: "PROLACTIN" No results found for: "CHOL", "TRIG", "HDL", "CHOLHDL", "VLDL", "LDLCALC" Lab Results  Component Value Date   TSH 0.674 07/19/2014    Therapeutic Level Labs: No results found for: "LITHIUM" No results found for: "VALPROATE" No results found for: "CBMZ"  Current Medications: Current Outpatient Medications  Medication Sig Dispense Refill   ALPRAZolam (XANAX) 1 MG tablet Take 1 tablet (1 mg total) by mouth in the morning, at noon, in the evening, and at bedtime. 120 tablet 2   FLUoxetine (PROZAC) 40 MG capsule Take 1 capsule (40 mg total) by mouth daily. 90 capsule 1   methylphenidate (RITALIN) 20 MG tablet Take 1 tablet (20 mg total) by mouth 2 (two) times daily with breakfast and lunch. 60 tablet 0   methylphenidate (RITALIN) 20 MG tablet Take 1 tablet (20 mg total) by mouth 2 (two) times daily with breakfast and lunch. 60 tablet 0   methylphenidate (RITALIN) 20 MG tablet Take 1 tablet (20 mg total) by mouth 2 (two) times daily with breakfast and lunch. 60 tablet 0   PARAGARD INTRAUTERINE COPPER IU by Intrauterine route.     traZODone (DESYREL) 50 MG tablet TAKE 1 TABLET BY MOUTH EVERYDAY AT BEDTIME 90 tablet 1   No current facility-administered medications for this visit.     Musculoskeletal: Strength & Muscle Tone: within normal limits Gait & Station: normal Patient leans: N/A  Psychiatric Specialty Exam: Review of Systems  All other systems reviewed and are negative.   There were no vitals taken for this visit.There is no height or weight on file to calculate BMI.  General Appearance: Casual, Neat, and Well Groomed  Eye  Contact:  Good  Speech:  Clear and Coherent  Volume:  Normal  Mood:  Euthymic  Affect:  Appropriate and Congruent  Thought Process:  Goal Directed  Orientation:  Full (Time, Place, and Person)  Thought Content: WDL   Suicidal Thoughts:  No  Homicidal Thoughts:  No  Memory:  Immediate;   Good Recent;   Good Remote;   Good  Judgement:  Good  Insight:  Good  Psychomotor Activity:  Normal  Concentration:  Concentration: Good and Attention Span: Good  Recall:  Good  Fund of Knowledge: Good  Language: Good  Akathisia:  No  Handed:  Right  AIMS (if indicated): not done  Assets:  Communication Skills Desire for Improvement Physical Health Resilience Social Support Talents/Skills Vocational/Educational  ADL's:  Intact  Cognition: WNL  Sleep:  Good   Screenings: PHQ2-9    Flowsheet Row Video Visit from 04/16/2022 in [place] Video Visit from 01/16/2022 in [place] Video Visit from 11/26/2021 in [facility] [facility] Video Visit from 09/24/2021 in [facility] from 07/25/2021 in [place] [facility]  PHQ-2 Total Score 0 0 1 2 4   PHQ-9 Total Score -- -- -- 5 15      Flowsheet Row Video Visit from 04/16/2022 in [place] Video Visit from 01/16/2022 in [place] [facility] Video Visit from 11/26/2021 in [place] No Risk No Risk No Risk        Assessment and Plan: This patient is a 41 year old female with a history of depression anxiety and ADD.  She continues to do well on her current regimen.  She will continue Prozac 40 mg daily for depression, Xanax 1 mg 4 times daily for anxiety and methylphenidate 20 mg twice daily for ADD.  She uses  trazodone 50 mg occasionally for sleep.  She will return to see me in 3 months  Collaboration of Care: Collaboration of Care: Primary Care Provider AEB notes will be shared with PCP at patient's request  Patient/Guardian was advised Release of Information must be obtained prior to any record release in order to collaborate their care with an outside provider. Patient/Guardian was advised if they have not already done so to contact the registration department to sign all necessary forms in order for [place] to release information regarding their care.   Consent: Patient/Guardian gives verbal consent for treatment and assignment of benefits for services provided during this visit. Patient/Guardian expressed understanding and agreed to proceed.    Levonne Spiller, MD 07/17/2022, 9:44 AM

## 2022-10-11 ENCOUNTER — Encounter (HOSPITAL_COMMUNITY): Payer: Self-pay | Admitting: Psychiatry

## 2022-10-11 ENCOUNTER — Telehealth (INDEPENDENT_AMBULATORY_CARE_PROVIDER_SITE_OTHER): Payer: BC Managed Care – PPO | Admitting: Psychiatry

## 2022-10-11 DIAGNOSIS — F900 Attention-deficit hyperactivity disorder, predominantly inattentive type: Secondary | ICD-10-CM

## 2022-10-11 DIAGNOSIS — F411 Generalized anxiety disorder: Secondary | ICD-10-CM

## 2022-10-11 DIAGNOSIS — F331 Major depressive disorder, recurrent, moderate: Secondary | ICD-10-CM

## 2022-10-11 MED ORDER — METHYLPHENIDATE HCL 20 MG PO TABS: 20.0000 mg | ORAL_TABLET | Freq: Two times a day (BID) | ORAL | 0 refills | Status: DC

## 2022-10-11 MED ORDER — METHYLPHENIDATE HCL 20 MG PO TABS
20.0000 mg | ORAL_TABLET | Freq: Two times a day (BID) | ORAL | 0 refills | Status: DC
Start: 2022-10-11 — End: 2023-01-09

## 2022-10-11 MED ORDER — FLUOXETINE HCL 40 MG PO CAPS: 40.0000 mg | ORAL_CAPSULE | Freq: Every day | ORAL | 1 refills | Status: DC

## 2022-10-11 MED ORDER — ALPRAZOLAM 1 MG PO TABS
1.0000 mg | ORAL_TABLET | Freq: Four times a day (QID) | ORAL | 2 refills | Status: DC
Start: 2022-10-11 — End: 2023-01-09

## 2022-10-11 NOTE — Progress Notes (Unsigned)
Virtual Visit via Video Note  I connected with Tina Weber on 10/11/22 at 10:40 AM EST by a video enabled telemedicine application and verified that I am speaking with the correct person using two identifiers.  Location: Patient: home Provider: office   I discussed the limitations of evaluation and management by telemedicine and the availability of in person appointments. The patient expressed understanding and agreed to proceed.     I discussed the assessment and treatment plan with the patient. The patient was provided an opportunity to ask questions and all were answered. The patient agreed with the plan and demonstrated an understanding of the instructions.   The patient was advised to call back or seek an in-person evaluation if the symptoms worsen or if the condition fails to improve as anticipated.  I provided 20 minutes of non-face-to-face time during this encounter.   Levonne Spiller, MD  [facility] MD/PA/NP OP Progress Note  10/11/2022 11:00 AM Tina Weber  MRN:  [id number]  Chief Complaint:  Chief Complaint  Patient presents with   Anxiety   Depression   ADD   Follow-up   HPI: This patient is a 42 year old divorced white female who lives with her 30-year-old daughter in [place]. 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  For the most part she is doing well.  However her ex-husband had relapsed into using drugs.  This was very disconcerting given the fact that her daughter was visiting him on weekends.  She had to cut off the visits while he was using.  She is now trying to get him to going to rehab so he can restart the visits.  This has been very taxing on her but she states that she is handling it "a lot better than I would have in previous years."  She is trying to use meditation and other methods to control her anxiety.  She is sleeping well without the trazodone.  Her focus is  good and she denies significant depression or anxiety. Visit Diagnosis:    ICD-10-CM   1. Attention deficit hyperactivity disorder (ADHD), predominantly inattentive type  F90.0 methylphenidate (RITALIN) 20 MG tablet    2. Moderate episode of recurrent major depressive disorder (HCC)  F33.1     3. Generalized anxiety disorder  F41.1       Past Psychiatric History: Has history of depression in high school as well as postpartum depression after the birth of her daughter which responded to Prozac. She also had ADHD as a child   Past Medical History:  Past Medical History:  Diagnosis Date   Anxiety    Depression    Migraines    Seizures ([place])    Last seizure almost one year ago   Supervision of normal pregnancy in first trimester 07/19/2014    [place] 42 yo wm 1st Dating By LMP and [place] Pap  07/19/14 GC/CT Initial:                36+wks: Genetic Screen NT/IT:  CF screen  Anatomic [place]  Flu vaccine  Tdap Recommended ~ 28wks Glucose Screen  2 hr GBS  Feed Preference  Contraception  Circumcision  Childbirth Classes  Pediatrician      Past Surgical History:  Procedure Laterality Date   CHOLECYSTECTOMY     WISDOM TOOTH EXTRACTION      Family Psychiatric History: see  below  Family History:  Family History  Problem Relation Age of Onset   Congestive Heart Failure Maternal Grandmother    Diabetes Maternal Grandfather    Hypertension Father    Anxiety disorder Father    Cancer Mother        thyroid   Anxiety disorder Mother     Social History:  Social History   Socioeconomic History   Marital status: Divorced    Spouse name: Not on file   Number of children: Not on file   Years of education: Not on file   Highest education level: Not on file  Occupational History   Not on file  Tobacco Use   Smoking status: Never   Smokeless tobacco: Never  Vaping Use   Vaping Use: Never used  Substance and Sexual Activity   Alcohol use: No    Comment: glass of  wine on weekends sometimes   Drug use: No    Comment: 01-05-16 per pt no   Sexual activity: Yes    Birth control/protection: I.U.D.  Other Topics Concern   Not on file  Social History Narrative   Not on file   Social Determinants of Health   Financial Resource Strain: Not on file  Food Insecurity: Not on file  Transportation Needs: Not on file  Physical Activity: Not on file  Stress: Not on file  Social Connections: Not on file    Allergies: No Known Allergies  Metabolic Disorder Labs: No results found for: "HGBA1C", "MPG" No results found for: "PROLACTIN" No results found for: "CHOL", "TRIG", "HDL", "CHOLHDL", "VLDL", "LDLCALC" Lab Results  Component Value Date   TSH 0.674 07/19/2014    Therapeutic Level Labs: No results found for: "LITHIUM" No results found for: "VALPROATE" No results found for: "CBMZ"  Current Medications: Current Outpatient Medications  Medication Sig Dispense Refill   ALPRAZolam (XANAX) 1 MG tablet Take 1 tablet (1 mg total) by mouth in the morning, at noon, in the evening, and at bedtime. 120 tablet 2   FLUoxetine (PROZAC) 40 MG capsule Take 1 capsule (40 mg total) by mouth daily. 90 capsule 1   methylphenidate (RITALIN) 20 MG tablet Take 1 tablet (20 mg total) by mouth 2 (two) times daily with breakfast and lunch. 60 tablet 0   methylphenidate (RITALIN) 20 MG tablet Take 1 tablet (20 mg total) by mouth 2 (two) times daily with breakfast and lunch. 60 tablet 0   methylphenidate (RITALIN) 20 MG tablet Take 1 tablet (20 mg total) by mouth 2 (two) times daily with breakfast and lunch. 60 tablet 0   PARAGARD INTRAUTERINE COPPER IU by Intrauterine route.     traZODone (DESYREL) 50 MG tablet TAKE 1 TABLET BY MOUTH EVERYDAY AT BEDTIME 90 tablet 1   No current facility-administered medications for this visit.     Musculoskeletal: Strength & Muscle Tone: within normal limits Gait & Station: normal Patient leans: N/A  Psychiatric Specialty  Exam: Review of Systems  All other systems reviewed and are negative.   There were no vitals taken for this visit.There is no height or weight on file to calculate BMI.  General Appearance: Casual and Fairly Groomed  Eye Contact:  Good  Speech:  Clear and Coherent  Volume:  Normal  Mood:  Euthymic  Affect:  Congruent  Thought Process:  Goal Directed  Orientation:  Full (Time, Place, and Person)  Thought Content: Rumination   Suicidal Thoughts:  No  Homicidal Thoughts:  No  Memory:  Immediate;  Good Recent;   Good Remote;   Fair  Judgement:  Good  Insight:  Good  Psychomotor Activity:  Normal  Concentration:  Concentration: Good and Attention Span: Good  Recall:  Good  Fund of Knowledge: Good  Language: Good  Akathisia:  No  Handed:  Right  AIMS (if indicated): not done  Assets:  Communication Skills Desire for Improvement Physical Health Resilience Social Support Talents/Skills Vocational/Educational  ADL's:  Intact  Cognition: WNL  Sleep:  Good   Screenings: PHQ2-9    Flowsheet Row Video Visit from 04/16/2022 in [place] [facility] [place] Video Visit from 01/16/2022 in [place] at [place] Video Visit from 11/26/2021 in [place] at [place] Video Visit from 09/24/2021 in [place] at [place] from 07/25/2021 in [place] [facility] Total Score 0 0 '1 2 4  '$ PHQ-9 Total Score -- -- -- 5 15      Flowsheet Row Video Visit from 04/16/2022 in [place] at [place] Video Visit from 01/16/2022 in [place] at [place] Video Visit from 11/26/2021 in [place] at [place] No Risk No Risk No Risk        Assessment and Plan: This patient is a 42 year old female with a history of  depression anxiety and ADD.  She continues to do well on her current regimen.  She will continue Prozac 40 mg daily for depression, Xanax 1 mg 4 times daily for anxiety and methylphenidate 20 mg twice daily for ADD.  She will return to see me in 3 months  Collaboration of Care: Collaboration of Care: Primary Care Provider AEB notes will be shared with PCP at patient's request  Patient/Guardian was advised Release of Information must be obtained prior to any record release in order to collaborate their care with an outside provider. Patient/Guardian was advised if they have not already done so to contact the registration department to sign all necessary forms in order for [place] to release information regarding their care.   Consent: Patient/Guardian gives verbal consent for treatment and assignment of benefits for services provided during this visit. Patient/Guardian expressed understanding and agreed to proceed.    Levonne Spiller, MD 10/11/2022, 11:00 AM

## 2023-01-09 ENCOUNTER — Encounter (HOSPITAL_COMMUNITY): Payer: Self-pay | Admitting: Psychiatry

## 2023-01-09 ENCOUNTER — Telehealth (INDEPENDENT_AMBULATORY_CARE_PROVIDER_SITE_OTHER): Payer: BC Managed Care – PPO | Admitting: Psychiatry

## 2023-01-09 DIAGNOSIS — F411 Generalized anxiety disorder: Secondary | ICD-10-CM | POA: Diagnosis not present

## 2023-01-09 DIAGNOSIS — F900 Attention-deficit hyperactivity disorder, predominantly inattentive type: Secondary | ICD-10-CM | POA: Diagnosis not present

## 2023-01-09 DIAGNOSIS — F331 Major depressive disorder, recurrent, moderate: Secondary | ICD-10-CM | POA: Diagnosis not present

## 2023-01-09 MED ORDER — METHYLPHENIDATE HCL 20 MG PO TABS: 20.0000 mg | ORAL_TABLET | Freq: Two times a day (BID) | ORAL | 0 refills | Status: DC

## 2023-01-09 MED ORDER — TRAZODONE HCL 50 MG PO TABS: ORAL_TABLET | ORAL | 1 refills | Status: DC

## 2023-01-09 MED ORDER — METHYLPHENIDATE HCL 20 MG PO TABS
20.0000 mg | ORAL_TABLET | Freq: Two times a day (BID) | ORAL | 0 refills | Status: DC
Start: 2023-01-09 — End: 2023-05-08

## 2023-01-09 MED ORDER — FLUOXETINE HCL 40 MG PO CAPS: 40.0000 mg | ORAL_CAPSULE | Freq: Every day | ORAL | 1 refills | Status: DC

## 2023-01-09 MED ORDER — ALPRAZOLAM 1 MG PO TABS: 1.0000 mg | ORAL_TABLET | Freq: Four times a day (QID) | ORAL | 2 refills | Status: DC

## 2023-01-09 NOTE — Progress Notes (Signed)
Virtual Visit via Video Note  I connected with Tina Weber on 01/09/23 at 11:00 AM EDT by a video enabled telemedicine application and verified that I am speaking with the correct person using two identifiers.  Location: Patient: home Provider: office   I discussed the limitations of evaluation and management by telemedicine and the availability of in person appointments. The patient expressed understanding and agreed to proceed.     I discussed the assessment and treatment plan with the patient. The patient was provided an opportunity to ask questions and all were answered. The patient agreed with the plan and demonstrated an understanding of the instructions.   The patient was advised to call back or seek an in-person evaluation if the symptoms worsen or if the condition fails to improve as anticipated.  I provided 15 minutes of non-face-to-face time during this encounter.   [name], MD  [facility] MD/PA/NP OP Progress Note  01/09/2023 11:22 AM Tina Weber  MRN:  [id number]  Chief Complaint:  Chief Complaint  Patient presents with   Anxiety   Depression   Follow-up   ADHD   HPI: This patient is a 42 year old divorced white female who lives with her daughter in [place].  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  The patient returns for follow-up regarding her depression and anxiety and ADHD after 3 months.  She states that about 3 weeks ago she was suddenly laid off.  She states that new company about her company and has been Surveyor, minerals off people Pharmacologist.  She was shocked and upset at first and cried quite a bit.  However she knows she has to provide herself and her daughter.  She has applied to several other companies and has been getting quite a few interviews.  She is also applied for Medicaid to make sure they have health insurance.  The medications right now are helping with her  depression and anxiety and she has been stable.  She is sleeping well and her focus is good obviously she is anxious about her financial situation Visit Diagnosis:    ICD-10-CM   1. Moderate episode of recurrent major depressive disorder (HCC)  F33.1     2. Attention deficit hyperactivity disorder (ADHD), predominantly inattentive type  F90.0 methylphenidate (RITALIN) 20 MG tablet    3. Generalized anxiety disorder  F41.1       Past Psychiatric History: Has history of depression in high school as well as postpartum depression after the birth of her daughter which responded to Prozac. She also had ADHD as a child   Past Medical History:  Past Medical History:  Diagnosis Date   Anxiety    Depression    Migraines    Seizures (HCC)    Last seizure almost one year ago   Supervision of normal pregnancy in first trimester 07/19/2014    [facility] alexya beldin 42 yo wm 1st Dating By LMP and [place] Pap  07/19/14 GC/CT Initial:                36+wks: Genetic Screen NT/IT:  CF screen  Anatomic [place]  Flu vaccine  Tdap Recommended ~ 28wks Glucose Screen  2 hr GBS  Feed Preference  Contraception  Circumcision  Childbirth Classes  Pediatrician      Past Surgical History:  Procedure Laterality Date   CHOLECYSTECTOMY     WISDOM TOOTH EXTRACTION  Family Psychiatric History: See below  Family History:  Family History  Problem Relation Age of Onset   Congestive Heart Failure Maternal Grandmother    Diabetes Maternal Grandfather    Hypertension Father    Anxiety disorder Father    Cancer Mother        thyroid   Anxiety disorder Mother     Social History:  Social History   Socioeconomic History   Marital status: Divorced    Spouse name: Not on file   Number of children: Not on file   Years of education: Not on file   Highest education level: Not on file  Occupational History   Not on file  Tobacco Use   Smoking status: Never   Smokeless tobacco: Never  Vaping Use    Vaping Use: Never used  Substance and Sexual Activity   Alcohol use: No    Comment: glass of wine on weekends sometimes   Drug use: No    Comment: 01-05-16 per pt no   Sexual activity: Yes    Birth control/protection: I.U.D.  Other Topics Concern   Not on file  Social History Narrative   Not on file   Social Determinants of Health   Financial Resource Strain: Not on file  Food Insecurity: Not on file  Transportation Needs: Not on file  Physical Activity: Not on file  Stress: Not on file  Social Connections: Not on file    Allergies: No Known Allergies  Metabolic Disorder Labs: No results found for: "HGBA1C", "MPG" No results found for: "PROLACTIN" No results found for: "CHOL", "TRIG", "HDL", "CHOLHDL", "VLDL", "LDLCALC" Lab Results  Component Value Date   TSH 0.674 07/19/2014    Therapeutic Level Labs: No results found for: "LITHIUM" No results found for: "VALPROATE" No results found for: "CBMZ"  Current Medications: Current Outpatient Medications  Medication Sig Dispense Refill   ALPRAZolam (XANAX) 1 MG tablet Take 1 tablet (1 mg total) by mouth in the morning, at noon, in the evening, and at bedtime. 120 tablet 2   FLUoxetine (PROZAC) 40 MG capsule Take 1 capsule (40 mg total) by mouth daily. 90 capsule 1   methylphenidate (RITALIN) 20 MG tablet Take 1 tablet (20 mg total) by mouth 2 (two) times daily with breakfast and lunch. 60 tablet 0   methylphenidate (RITALIN) 20 MG tablet Take 1 tablet (20 mg total) by mouth 2 (two) times daily with breakfast and lunch. 60 tablet 0   methylphenidate (RITALIN) 20 MG tablet Take 1 tablet (20 mg total) by mouth 2 (two) times daily with breakfast and lunch. 60 tablet 0   PARAGARD INTRAUTERINE COPPER IU by Intrauterine route.     traZODone (DESYREL) 50 MG tablet TAKE 1 TABLET BY MOUTH EVERYDAY AT BEDTIME 90 tablet 1   No current facility-administered medications for this visit.     Musculoskeletal: Strength & Muscle Tone:  within normal limits Gait & Station: normal Patient leans: N/A  Psychiatric Specialty Exam: Review of Systems  Psychiatric/Behavioral:  The patient is nervous/anxious.   All other systems reviewed and are negative.   There were no vitals taken for this visit.There is no height or weight on file to calculate BMI.  General Appearance: Casual and Fairly Groomed  Eye Contact:  Good  Speech:  Clear and Coherent  Volume:  Normal  Mood:  Anxious and Euthymic  Affect:  Congruent  Thought Process:  Goal Directed  Orientation:  Full (Time, Place, and Person)  Thought Content: WDL   Suicidal  Thoughts:  No  Homicidal Thoughts:  No  Memory:  Immediate;   Good Recent;   Good Remote;   Good  Judgement:  Good  Insight:  Good  Psychomotor Activity:  Normal  Concentration:  Concentration: Good and Attention Span: Good  Recall:  Good  Fund of Knowledge: Good  Language: Good  Akathisia:  No  Handed:  Right  AIMS (if indicated): not done  Assets:  Communication Skills Desire for Improvement Physical Health Resilience Social Support Talents/Skills  ADL's:  Intact  Cognition: WNL  Sleep:  Good   Screenings: PHQ2-9    Flowsheet Row Video Visit from 04/16/2022 in [facility] at [place] Video Visit from 01/16/2022 in [facility] at [place] Video Visit from 11/26/2021 in [facility] at [place] Video Visit from 09/24/2021 in [facility] at [place] Counselor from 07/25/2021 in [facility] at [facility] Total Score 0 0 1 2 4   PHQ-9 Total Score -- -- -- 5 15      Flowsheet Row Video Visit from 04/16/2022 in [facility] at [place] Video Visit from 01/16/2022 in [facility]patient [facility] Health at [place] Video Visit from 11/26/2021 in [facility] Outpatient [facility] at [place]  C-SSRS  RISK CATEGORY No Risk No Risk No Risk        Assessment and Plan: This patient is a 42 year old female with a history of depression anxiety and ADD.  She continues to do well on her current regimen.  She will continue Prozac 40 mg daily for depression, Xanax 1 mg 4 times daily for anxiety and methylphenidate 20 mg twice daily for ADD.  She will return to see me in in 3 months or call sooner as needed  Collaboration of Care: Collaboration of Care: Primary Care Provider AEB notes will be shared with PCP at patient's request  Patient/Guardian was advised Release of Information must be obtained prior to any record release in order to collaborate their care with an outside provider. Patient/Guardian was advised if they have not already done so to contact the registration department to sign all necessary forms in order for [place] to release information regarding their care.   Consent: Patient/Guardian gives verbal consent for treatment and assignment of benefits for services provided during this visit. Patient/Guardian expressed understanding and agreed to proceed.    [name], MD 01/09/2023, 11:22 AM

## 2023-04-11 ENCOUNTER — Telehealth (INDEPENDENT_AMBULATORY_CARE_PROVIDER_SITE_OTHER): Payer: Self-pay | Admitting: Psychiatry

## 2023-04-11 DIAGNOSIS — Z91199 Patient's noncompliance with other medical treatment and regimen due to unspecified reason: Secondary | ICD-10-CM

## 2023-04-12 NOTE — Progress Notes (Signed)
No show

## 2023-04-14 ENCOUNTER — Telehealth (INDEPENDENT_AMBULATORY_CARE_PROVIDER_SITE_OTHER): Payer: Medicaid Other | Admitting: Psychiatry

## 2023-05-08 ENCOUNTER — Other Ambulatory Visit (HOSPITAL_COMMUNITY): Payer: Self-pay | Admitting: Psychiatry

## 2023-05-08 ENCOUNTER — Telehealth (HOSPITAL_COMMUNITY): Payer: Self-pay

## 2023-05-08 DIAGNOSIS — F900 Attention-deficit hyperactivity disorder, predominantly inattentive type: Secondary | ICD-10-CM

## 2023-05-08 MED ORDER — METHYLPHENIDATE HCL 20 MG PO TABS
20.0000 mg | ORAL_TABLET | Freq: Two times a day (BID) | ORAL | 0 refills | Status: DC
Start: 2023-05-08 — End: 2023-07-03

## 2023-05-08 MED ORDER — ALPRAZOLAM 1 MG PO TABS: 1.0000 mg | ORAL_TABLET | Freq: Four times a day (QID) | ORAL | 2 refills | Status: DC

## 2023-05-08 MED ORDER — FLUOXETINE HCL 40 MG PO CAPS: 40.0000 mg | ORAL_CAPSULE | Freq: Every day | ORAL | 1 refills | Status: DC

## 2023-05-08 NOTE — Telephone Encounter (Signed)
Pt called in requesting refills on her ALPRAZolam (XANAX) 1 MG tablet , methylphenidate (RITALIN) 20 MG tablet , and FLUoxetine (PROZAC) [facility] [address]. Pt scheduled for 05/19/23. Please advise.

## 2023-05-08 NOTE — Telephone Encounter (Signed)
Spoke with Dezyrae advised rx has been sent pt verablized undertstanding

## 2023-05-19 ENCOUNTER — Encounter (HOSPITAL_COMMUNITY): Payer: Self-pay | Admitting: Psychiatry

## 2023-05-19 ENCOUNTER — Telehealth (INDEPENDENT_AMBULATORY_CARE_PROVIDER_SITE_OTHER): Payer: BC Managed Care – PPO | Admitting: Psychiatry

## 2023-05-19 DIAGNOSIS — F331 Major depressive disorder, recurrent, moderate: Secondary | ICD-10-CM | POA: Diagnosis not present

## 2023-05-19 DIAGNOSIS — F900 Attention-deficit hyperactivity disorder, predominantly inattentive type: Secondary | ICD-10-CM

## 2023-05-19 DIAGNOSIS — F411 Generalized anxiety disorder: Secondary | ICD-10-CM | POA: Diagnosis not present

## 2023-05-19 MED ORDER — METHYLPHENIDATE HCL 20 MG PO TABS: 20.0000 mg | ORAL_TABLET | Freq: Two times a day (BID) | ORAL | 0 refills | Status: DC

## 2023-05-19 MED ORDER — ALPRAZOLAM 1 MG PO TABS: 1.0000 mg | ORAL_TABLET | Freq: Three times a day (TID) | ORAL | 2 refills | Status: DC | PRN

## 2023-05-19 MED ORDER — ARIPIPRAZOLE 2 MG PO TABS
2.0000 mg | ORAL_TABLET | Freq: Every day | ORAL | 2 refills | Status: DC
Start: 2023-05-19 — End: 2023-07-03

## 2023-05-19 MED ORDER — FLUOXETINE HCL 40 MG PO CAPS: 40.0000 mg | ORAL_CAPSULE | Freq: Every day | ORAL | 1 refills | Status: DC

## 2023-05-19 NOTE — Progress Notes (Signed)
Virtual Visit via Video Note  I connected with Tina Weber on 05/19/23 at  9:00 AM EDT by a video enabled telemedicine application and verified that I am speaking with the correct person using two identifiers.  Location: Patient: home Provider: office   I discussed the limitations of evaluation and management by telemedicine and the availability of in person appointments. The patient expressed understanding and agreed to proceed.     I discussed the assessment and treatment plan with the patient. The patient was provided an opportunity to ask questions and all were answered. The patient agreed with the plan and demonstrated an understanding of the instructions.   The patient was advised to call back or seek an in-person evaluation if the symptoms worsen or if the condition fails to improve as anticipated.  I provided 20 minutes of non-face-to-face time during this encounter.   [name], MD  [facility] MD/PA/NP OP Progress Note  05/19/2023 9:23 AM Tina Weber  MRN:  [id number]  Chief Complaint:  Chief Complaint  Patient presents with   Anxiety   Depression   ADD   Follow-up   HPI: This patient is a 42 year old divorced white female who lives with her daughter in [place].  She is now working for a Facilities manager.  The patient returns for follow-up regarding depression anxiety and ADD after 4 months.  Last time I spoke to her she had been laid off.  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  She has to travel to [place] and is really disrupted her schedule as she has less time to spend with her daughter.  She states that she has become more depressed and has 0 energy.  She does not feel like doing anything and has to force herself to get up and take showers and go to work.  Interestingly however she presents is somewhat bright and cheerful today and is nicely dressed.  She has been on numerous antidepressants and I suggested that we try Abilify  with the Prozac for augmentation.  She is still with the same boyfriend and has a lot of good support from family.  She denies any thoughts of suicide or self-harm.  She is actually been able to reduce the Xanax to 1 mg 3 times daily.  She is sleeping well and does not need the trazodone Visit Diagnosis:    ICD-10-CM   1. Attention deficit hyperactivity disorder (ADHD), predominantly inattentive type  F90.0     2. Moderate episode of recurrent major depressive disorder (HCC)  F33.1     3. Generalized anxiety disorder  F41.1       Past Psychiatric History: Has history of depression in high school as well as postpartum depression after the birth of her daughter which responded to Prozac. She also had ADHD as a child   Past Medical History:  Past Medical History:  Diagnosis Date   Anxiety    Depression    Migraines    Seizures (HCC)    Last seizure almost one year ago   Supervision of normal pregnancy in first trimester 07/19/2014    [facility] tabetha haraway 42 yo wm 1st Dating By LMP and [place] Pap  07/19/14 GC/CT Initial:                36+wks: Genetic Screen NT/IT:  CF screen  Anatomic [place]  Flu vaccine  Tdap Recommended ~ 28wks Glucose Screen  2 hr GBS  Feed Preference  Contraception  Circumcision  Childbirth Classes  Pediatrician      Past Surgical History:  Procedure Laterality Date   CHOLECYSTECTOMY     WISDOM TOOTH EXTRACTION      Family Psychiatric History: See below  Family History:  Family History  Problem Relation Age of Onset   Congestive Heart Failure Maternal Grandmother    Diabetes Maternal Grandfather    Hypertension Father    Anxiety disorder Father    Cancer Mother        thyroid   Anxiety disorder Mother     Social History:  Social History   Socioeconomic History   Marital status: Divorced    Spouse name: Not on file   Number of children: Not on file   Years of education: Not on file   Highest education level: Not on file  Occupational  History   Not on file  Tobacco Use   Smoking status: Never   Smokeless tobacco: Never  Vaping Use   Vaping status: Never Used  Substance and Sexual Activity   Alcohol use: No    Comment: glass of wine on weekends sometimes   Drug use: No    Comment: 01-05-16 per pt no   Sexual activity: Yes    Birth control/protection: I.U.D.  Other Topics Concern   Not on file  Social History Narrative   Not on file   Social Determinants of Health   Financial Resource Strain: Not on file  Food Insecurity: Not on file  Transportation Needs: Not on file  Physical Activity: Not on file  Stress: Not on file  Social Connections: Unknown (12/16/2021)   [facility] from [facility]   Social Network    Social Network: Not on file    Allergies: No Known Allergies  Metabolic Disorder Labs: No results found for: "HGBA1C", "MPG" No results found for: "PROLACTIN" No results found for: "CHOL", "TRIG", "HDL", "CHOLHDL", "VLDL", "LDLCALC" Lab Results  Component Value Date   TSH 0.674 07/19/2014    Therapeutic Level Labs: No results found for: "LITHIUM" No results found for: "VALPROATE" No results found for: "CBMZ"  Current Medications: Current Outpatient Medications  Medication Sig Dispense Refill   ARIPiprazole (ABILIFY) 2 MG tablet Take 1 tablet (2 mg total) by mouth daily. 30 tablet 2   ALPRAZolam (XANAX) 1 MG tablet Take 1 tablet (1 mg total) by mouth 3 (three) times daily as needed for anxiety. 90 tablet 2   FLUoxetine (PROZAC) 40 MG capsule Take 1 capsule (40 mg total) by mouth daily. 90 capsule 1   methylphenidate (RITALIN) 20 MG tablet Take 1 tablet (20 mg total) by mouth 2 (two) times daily with breakfast and lunch. 60 tablet 0   methylphenidate (RITALIN) 20 MG tablet Take 1 tablet (20 mg total) by mouth 2 (two) times daily with breakfast and lunch. 60 tablet 0   methylphenidate (RITALIN) 20 MG tablet Take 1 tablet (20 mg total) by mouth 2 (two) times daily with breakfast  and lunch. 60 tablet 0   PARAGARD INTRAUTERINE COPPER IU by Intrauterine route.     No current facility-administered medications for this visit.     Musculoskeletal: Strength & Muscle Tone: within normal limits Gait & Station: normal Patient leans: N/A  Psychiatric Specialty Exam: Review of Systems  Psychiatric/Behavioral:  Positive for dysphoric mood.   All other systems reviewed and are negative.   There were no vitals taken for this visit.There is no height or weight on file to calculate BMI.  General Appearance: Neat and Well Groomed  Eye Contact:  Good  Speech:  Clear and Coherent  Volume:  Normal  Mood:  Depressed  Affect:  Congruent  Thought Process:  Goal Directed  Orientation:  Full (Time, Place, and Person)  Thought Content: Rumination   Suicidal Thoughts:  No  Homicidal Thoughts:  No  Memory:  Immediate;   Good Recent;   Good Remote;   Good  Judgement:  Good  Insight:  Good  Psychomotor Activity:  Decreased  Concentration:  Concentration: Good and Attention Span: Good  Recall:  Good  Fund of Knowledge: Good  Language: Good  Akathisia:  No  Handed:  Right  AIMS (if indicated): not done  Assets:  Communication Skills Desire for Improvement Physical Health Resilience Social Support Talents/Skills  ADL's:  Intact  Cognition: WNL  Sleep:  Good   Screenings: PHQ2-9    Flowsheet Row Video Visit from 04/16/2022 in [facility] at [place] Video Visit from 01/16/2022 in [facility] at [place] Video Visit from 11/26/2021 in [facility] Outpatient [facility]al Health at [place] Video Visit from 09/24/2021 in [facility] Outpatient Behavioral Health at [place] Counselor from 07/25/2021 in [facility] at [facility] Total Score 0 0 1 2 4   PHQ-9 Total Score -- -- -- 5 15      Flowsheet Row Video Visit from 04/16/2022 in [facility] Outpatient Behavioral Health at  [place] Video Visit from 01/16/2022 in [facility] at [place] Video Visit from 11/26/2021 in [facility] Outpatient Behavioral Health at [place]  C-SSRS RISK CATEGORY No Risk No Risk No Risk        Assessment and Plan: This patient is a 42 year old female with a history of depression and anxiety and ADD.  She has been more depressed lately probably because of all the changes due to her job.  She will continue Prozac 40 mg daily but add Abilify 2 mg daily for augmentation.  She will continue Xanax at a reduced dose-3 mg 3 times daily for anxiety and continue methylphenidate 20 mg twice daily for ADD.  She will return to see me in 4 weeks  Collaboration of Care: Collaboration of Care: Primary Care Provider AEB notes will be shared with PCP at patient's request  Patient/Guardian was advised Release of Information must be obtained prior to any record release in order to collaborate their care with an outside provider. Patient/Guardian was advised if they have not already done so to contact the registration department to sign all necessary forms in order for [place] to release information regarding their care.   Consent: Patient/Guardian gives verbal consent for treatment and assignment of benefits for services provided during this visit. Patient/Guardian expressed understanding and agreed to proceed.    [name], MD 05/19/2023, 9:23 AM

## 2023-07-01 ENCOUNTER — Telehealth (HOSPITAL_COMMUNITY): Payer: Self-pay | Admitting: *Deleted

## 2023-07-01 ENCOUNTER — Other Ambulatory Visit (HOSPITAL_COMMUNITY): Payer: Self-pay | Admitting: Psychiatry

## 2023-07-01 MED ORDER — METHYLPHENIDATE HCL 20 MG PO TABS: 20.0000 mg | ORAL_TABLET | Freq: Two times a day (BID) | ORAL | 0 refills | Status: DC

## 2023-07-01 NOTE — Telephone Encounter (Signed)
Patient called stating she is needing refills for her Ritalin 20mg . Per pt she would [facility] [address] in [place].

## 2023-07-01 NOTE — Telephone Encounter (Signed)
Informed patient and she verbalized understanding.

## 2023-07-03 ENCOUNTER — Telehealth (INDEPENDENT_AMBULATORY_CARE_PROVIDER_SITE_OTHER): Payer: BC Managed Care – PPO | Admitting: Psychiatry

## 2023-07-03 ENCOUNTER — Encounter (HOSPITAL_COMMUNITY): Payer: Self-pay | Admitting: Psychiatry

## 2023-07-03 ENCOUNTER — Telehealth (HOSPITAL_COMMUNITY): Payer: Self-pay

## 2023-07-03 DIAGNOSIS — F331 Major depressive disorder, recurrent, moderate: Secondary | ICD-10-CM | POA: Diagnosis not present

## 2023-07-03 DIAGNOSIS — F900 Attention-deficit hyperactivity disorder, predominantly inattentive type: Secondary | ICD-10-CM

## 2023-07-03 DIAGNOSIS — F411 Generalized anxiety disorder: Secondary | ICD-10-CM | POA: Diagnosis not present

## 2023-07-03 MED ORDER — ALPRAZOLAM 1 MG PO TABS: 1.0000 mg | ORAL_TABLET | Freq: Two times a day (BID) | ORAL | 2 refills | Status: DC | PRN

## 2023-07-03 MED ORDER — FLUOXETINE HCL 40 MG PO CAPS: 40.0000 mg | ORAL_CAPSULE | Freq: Every day | ORAL | 1 refills | Status: DC

## 2023-07-03 MED ORDER — ARIPIPRAZOLE 2 MG PO TABS: 2.0000 mg | ORAL_TABLET | Freq: Every day | ORAL | 2 refills | Status: DC

## 2023-07-03 MED ORDER — METHYLPHENIDATE HCL 20 MG PO TABS: 20.0000 mg | ORAL_TABLET | Freq: Two times a day (BID) | ORAL | 0 refills | Status: DC

## 2023-07-03 NOTE — Progress Notes (Signed)
Virtual Visit via Video Note  I connected with Tina Weber on 07/03/23 at  2:40 PM EST by a video enabled telemedicine application and verified that I am speaking with the correct person using two identifiers.  Location: Patient: home Provider: office   I discussed the limitations of evaluation and management by telemedicine and the availability of in person appointments. The patient expressed understanding and agreed to proceed.    I discussed the assessment and treatment plan with the patient. The patient was provided an opportunity to ask questions and all were answered. The patient agreed with the plan and demonstrated an understanding of the instructions.   The patient was advised to call back or seek an in-person evaluation if the symptoms worsen or if the condition fails to improve as anticipated.  I provided 1 minutes of non-face-to-face time during this encounter.   Tina Ruder, MD  [facility] MD/PA/NP OP Progress Note  07/03/2023 2:59 PM Tina Weber  MRN:  [id number]  Chief Complaint:  Chief Complaint  Patient presents with   Depression   Anxiety   Follow-up   ADD   HPI: This patient is a 42 year old divorced white female who lives with her daughter in [place].  She is working for a Facilities manager.  The patient returns for follow-up regarding her depression anxiety and ADD after 4 weeks.  She did not states that she was never able to get the Abilify that I suggested last time because her "insurance did not pay for it."  Apparently however we were never notified about needing a prior authorization.  Therefore her mood is still somewhat down although she is still functioning well at work and taking care of her daughter.  She is sleeping well denies thoughts of suicide or self-harm.  She has cut down the Xanax to 1 mg twice daily as needed.  I explained that we would again try to send in the Abilify because I think it would help augment the Prozac and she is in  agreement Visit Diagnosis:    ICD-10-CM   1. Moderate episode of recurrent major depressive disorder (HCC)  F33.1     2. Attention deficit hyperactivity disorder (ADHD), predominantly inattentive type  F90.0 methylphenidate (RITALIN) 20 MG tablet    3. Generalized anxiety disorder  F41.1       Past Psychiatric History: Has history of depression in high school as well as postpartum depression after the birth of her daughter which responded to Prozac. She also had ADHD as a child   Past Medical History:  Past Medical History:  Diagnosis Date   Anxiety    Depression    Migraines    Seizures (HCC)    Last seizure almost one year ago   Supervision of normal pregnancy in first trimester 07/19/2014    [facility] F[facility] kimberle patalano 42 yo wm 1st Dating By LMP and [place] Pap  07/19/14 GC/CT Initial:                36+wks: Genetic Screen NT/IT:  CF screen  Anatomic [place]  Flu vaccine  Tdap Recommended ~ 28wks Glucose Screen  2 hr GBS  Feed Preference  Contraception  Circumcision  Childbirth Classes  Pediatrician      Past Surgical History:  Procedure Laterality Date   CHOLECYSTECTOMY     WISDOM TOOTH EXTRACTION      Family Psychiatric History: See below  Family History:  Family History  Problem Relation Age of Onset  Congestive Heart Failure Maternal Grandmother    Diabetes Maternal Grandfather    Hypertension Father    Anxiety disorder Father    Cancer Mother        thyroid   Anxiety disorder Mother     Social History:  Social History   Socioeconomic History   Marital status: Divorced    Spouse name: Not on file   Number of children: Not on file   Years of education: Not on file   Highest education level: Not on file  Occupational History   Not on file  Tobacco Use   Smoking status: Never   Smokeless tobacco: Never  Vaping Use   Vaping status: Never Used  Substance and Sexual Activity   Alcohol use: No    Comment: glass of wine on weekends sometimes   Drug use: No     Comment: 01-05-16 per pt no   Sexual activity: Yes    Birth control/protection: I.U.D.  Other Topics Concern   Not on file  Social History Narrative   Not on file   Social Determinants of Health   Financial Resource Strain: Not on file  Food Insecurity: Not on file  Transportation Needs: Not on file  Physical Activity: Not on file  Stress: Not on file  Social Connections: Unknown (12/16/2021)   [facility] from [facility]   Social Network    Social Network: Not on file    Allergies: No Known Allergies  Metabolic Disorder Labs: No results found for: "HGBA1C", "MPG" No results found for: "PROLACTIN" No results found for: "CHOL", "TRIG", "HDL", "CHOLHDL", "VLDL", "LDLCALC" Lab Results  Component Value Date   TSH 0.674 07/19/2014    Therapeutic Level Labs: No results found for: "LITHIUM" No results found for: "VALPROATE" No results found for: "CBMZ"  Current Medications: Current Outpatient Medications  Medication Sig Dispense Refill   ALPRAZolam (XANAX) 1 MG tablet Take 1 tablet (1 mg total) by mouth 2 (two) times daily as needed for anxiety. 60 tablet 2   ARIPiprazole (ABILIFY) 2 MG tablet Take 1 tablet (2 mg total) by mouth daily. 30 tablet 2   FLUoxetine (PROZAC) 40 MG capsule Take 1 capsule (40 mg total) by mouth daily. 90 capsule 1   methylphenidate (RITALIN) 20 MG tablet Take 1 tablet (20 mg total) by mouth 2 (two) times daily with breakfast and lunch. 60 tablet 0   methylphenidate (RITALIN) 20 MG tablet Take 1 tablet (20 mg total) by mouth 2 (two) times daily with breakfast and lunch. 60 tablet 0   methylphenidate (RITALIN) 20 MG tablet Take 1 tablet (20 mg total) by mouth 2 (two) times daily with breakfast and lunch. 60 tablet 0   PARAGARD INTRAUTERINE COPPER IU by Intrauterine route.     No current facility-administered medications for this visit.     Musculoskeletal: Strength & Muscle Tone: within normal limits Gait & Station:  normal Patient leans: N/A  Psychiatric Specialty Exam: Review of Systems  Psychiatric/Behavioral:  Positive for dysphoric mood.   All other systems reviewed and are negative.   There were no vitals taken for this visit.There is no height or weight on file to calculate BMI.  General Appearance: Casual and Fairly Groomed  Eye Contact:  Good  Speech:  Clear and Coherent  Volume:  Normal  Mood:  Dysphoric  Affect:  Congruent  Thought Process:  Goal Directed  Orientation:  Full (Time, Place, and Person)  Thought Content: Rumination   Suicidal Thoughts:  No  Homicidal Thoughts:  No  Memory:  Immediate;   Good Recent;   Good Remote;   Good  Judgement:  Good  Insight:  Good  Psychomotor Activity:  Decreased  Concentration:  Concentration: Good and Attention Span: Good  Recall:  Good  Fund of Knowledge: Good  Language: Good  Akathisia:  No  Handed:  Right  AIMS (if indicated): not done  Assets:  Communication Skills Desire for Improvement Physical Health Resilience Social Support Vocational/Educational  ADL's:  Intact  Cognition: WNL  Sleep:  Good   Screenings: PHQ2-9    Flowsheet Row Video Visit from 04/16/2022 in [facility] at [place] Video Visit from 01/16/2022 in [facility] at [place] Video Visit from 11/26/2021 in [facility] at [place] Video Visit from 09/24/2021 in [facility] at [place] Counselor from 07/25/2021 in [facility] at [facility] Total Score 0 0 1 2 4   PHQ-9 Total Score -- -- -- 5 15      Flowsheet Row Video Visit from 04/16/2022 in [facility] at [place] Video Visit from 01/16/2022 in [facility] at [place] Video Visit from 11/26/2021 in [facility] Outpatient [facility] at [place]  C-SSRS RISK CATEGORY No Risk No Risk No Risk         Assessment and Plan: This patient is a 42 year old female with a history of depression anxiety and ADD.  She is slightly more depressed and we have tried to add Abilify 2 mg to the Prozac 40 mg and we will need to get this approved.  She will continue Xanax at a reduced dose-1 mg twice daily for anxiety and continue methylphenidate 20 mg daily for ADD.  She will return to see me in 6 weeks  Collaboration of Care: Collaboration of Care: Primary Care Provider AEB notes will be shared with PCP at patient's request  Patient/Guardian was advised Release of Information must be obtained prior to any record release in order to collaborate their care with an outside provider. Patient/Guardian was advised if they have not already done so to contact the registration department to sign all necessary forms in order for [place] to release information regarding their care.   Consent: Patient/Guardian gives verbal consent for treatment and assignment of benefits for services provided during this visit. Patient/Guardian expressed understanding and agreed to proceed.    Tina Ruder, MD 07/03/2023, 2:59 PM

## 2023-07-03 NOTE — Telephone Encounter (Signed)
Medication management - Telephone call with patient twice, her [facility] times and worked on CoverMyMeds to attempt to get patient's Abilify 2 mg tablets approved. Patient with new insurance and provided to pharmacy. Problems filling as her [employer] is not approving and Comptroller, pharmacist stated they cannot get the prescription to go through with the new [employer] [place] patient states having, with ID #[id number].  Requested patient call her Medicaid carrier to see if another insurance was still showing up or if could be covered by them. Patient stated she would call her Medicaid carrier and then [facility] with information.  Pharmacy to call us back if anything else needed.

## 2023-07-07 NOTE — Telephone Encounter (Signed)
Called patient pharmacy [employer] [place] and they stated that the medication Abilify and or the generic does not need a PA and the pharmacy did not run it with them. Staff then called patient [employer] [place]. PA was completed and medication was approved. Approval number is [id number] and will expire 07-06-2024. Staff called patient to inform her with information and was not able to reach her. Staff left message for patient to call office back. Office number was provided on voicemail.

## 2023-09-11 ENCOUNTER — Encounter (HOSPITAL_COMMUNITY): Payer: Self-pay | Admitting: Psychiatry

## 2023-09-11 ENCOUNTER — Telehealth (HOSPITAL_COMMUNITY): Payer: BC Managed Care – PPO | Admitting: Psychiatry

## 2023-09-11 DIAGNOSIS — F411 Generalized anxiety disorder: Secondary | ICD-10-CM | POA: Diagnosis not present

## 2023-09-11 DIAGNOSIS — F900 Attention-deficit hyperactivity disorder, predominantly inattentive type: Secondary | ICD-10-CM | POA: Diagnosis not present

## 2023-09-11 DIAGNOSIS — F331 Major depressive disorder, recurrent, moderate: Secondary | ICD-10-CM

## 2023-09-11 MED ORDER — AMPHETAMINE-DEXTROAMPHETAMINE 20 MG PO TABS: 20.0000 mg | ORAL_TABLET | Freq: Two times a day (BID) | ORAL | 0 refills | Status: DC

## 2023-09-11 MED ORDER — FLUOXETINE HCL 40 MG PO CAPS: 40.0000 mg | ORAL_CAPSULE | Freq: Every day | ORAL | 1 refills | Status: DC

## 2023-09-11 MED ORDER — ALPRAZOLAM 1 MG PO TABS: 1.0000 mg | ORAL_TABLET | Freq: Three times a day (TID) | ORAL | 2 refills | Status: DC | PRN

## 2023-09-11 MED ORDER — ARIPIPRAZOLE 2 MG PO TABS: 2.0000 mg | ORAL_TABLET | Freq: Every day | ORAL | 2 refills | Status: DC

## 2023-09-11 NOTE — Progress Notes (Signed)
Virtual Visit via Video Note  I connected with Tina Weber on 09/11/23 at  1:00 PM EST by a video enabled telemedicine application and verified that I am speaking with the correct person using two identifiers.  Location: Patient: home Provider: office   I discussed the limitations of evaluation and management by telemedicine and the availability of in person appointments. The patient expressed understanding and agreed to proceed.      I discussed the assessment and treatment plan with the patient. The patient was provided an opportunity to ask questions and all were answered. The patient agreed with the plan and demonstrated an understanding of the instructions.   The patient was advised to call back or seek an in-person evaluation if the symptoms worsen or if the condition fails to improve as anticipated.  I provided 20 minutes of non-face-to-face time during this encounter.   [name], MD  [facility] MD/PA/NP OP Progress Note  09/11/2023 1:15 PM Tina Weber  MRN:  [id number]  Chief Complaint:  Chief Complaint  Patient presents with   ADHD   Anxiety   Depression   Follow-up   HPI: This patient is a 43 year old divorced white female who lives with her daughter in [place].  She is working for a Facilities manager.  The patient returns for follow-up after 2 months regarding her depression anxiety and ADD.  She still has not been able to get the Abilify because of needing a  Visitation.  Will have to look into this.  Her mood is still a bit down is also because she does not like her new job.  She is bored and overwhelmed and is trying to find something else.  Last time she told me she had cut the Xanax down to 1 mg twice daily but now states she needs to take it 3 times a day because she is so anxious.  She also does not think the methylphenidate is working for her anymore and she is up to 40 mg daily.  I suggested we try another medicine like Adderall and she agrees.  She  denies thoughts of self-harm or suicide.  She is sleeping fairly well Visit Diagnosis:    ICD-10-CM   1. Attention deficit hyperactivity disorder (ADHD), predominantly inattentive type  F90.0     2. Moderate episode of recurrent major depressive disorder (HCC)  F33.1     3. Generalized anxiety disorder  F41.1       Past Psychiatric History:  Has history of depression in high school as well as postpartum depression after the birth of her daughter which responded to Prozac. She also had ADHD as a child   Past Medical History:  Past Medical History:  Diagnosis Date   Anxiety    Depression    Migraines    Seizures (HCC)    Last seizure almost one year ago   Supervision of normal pregnancy in first trimester 07/19/2014    [facility] detrice behl 43 yo wm 1st Dating By LMP and [place] Pap  07/19/14 GC/CT Initial:                36+wks: Genetic Screen NT/IT:  CF screen  Anatomic [place]  Flu vaccine  Tdap Recommended ~ 28wks Glucose Screen  2 hr GBS  Feed Preference  Contraception  Circumcision  Childbirth Classes  Pediatrician      Past Surgical History:  Procedure Laterality Date   CHOLECYSTECTOMY     WISDOM TOOTH EXTRACTION  Family Psychiatric History: See below  Family History:  Family History  Problem Relation Age of Onset   Congestive Heart Failure Maternal Grandmother    Diabetes Maternal Grandfather    Hypertension Father    Anxiety disorder Father    Cancer Mother        thyroid   Anxiety disorder Mother     Social History:  Social History   Socioeconomic History   Marital status: Divorced    Spouse name: Not on file   Number of children: Not on file   Years of education: Not on file   Highest education level: Not on file  Occupational History   Not on file  Tobacco Use   Smoking status: Never   Smokeless tobacco: Never  Vaping Use   Vaping status: Never Used  Substance and Sexual Activity   Alcohol use: No    Comment: glass of wine on weekends  sometimes   Drug use: No    Comment: 01-05-16 per pt no   Sexual activity: Yes    Birth control/protection: I.U.D.  Other Topics Concern   Not on file  Social History Narrative   Not on file   Social Drivers of Health   Financial Resource Strain: Not on file  Food Insecurity: Not on file  Transportation Needs: Not on file  Physical Activity: Not on file  Stress: Not on file  Social Connections: Unknown (12/16/2021)   [facility]   Social Network    Social Network: Not on file    Allergies: No Known Allergies  Metabolic Disorder Labs: No results found for: "HGBA1C", "MPG" No results found for: "PROLACTIN" No results found for: "CHOL", "TRIG", "HDL", "CHOLHDL", "VLDL", "LDLCALC" Lab Results  Component Value Date   TSH 0.674 07/19/2014    Therapeutic Level Labs: No results found for: "LITHIUM" No results found for: "VALPROATE" No results found for: "CBMZ"  Current Medications: Current Outpatient Medications  Medication Sig Dispense Refill   amphetamine-dextroamphetamine (ADDERALL) 20 MG tablet Take 1 tablet (20 mg total) by mouth 2 (two) times daily. 60 tablet 0   ALPRAZolam (XANAX) 1 MG tablet Take 1 tablet (1 mg total) by mouth 3 (three) times daily as needed for anxiety. 90 tablet 2   ARIPiprazole (ABILIFY) 2 MG tablet Take 1 tablet (2 mg total) by mouth daily. 30 tablet 2   FLUoxetine (PROZAC) 40 MG capsule Take 1 capsule (40 mg total) by mouth daily. 90 capsule 1   PARAGARD INTRAUTERINE COPPER IU by Intrauterine route.     No current facility-administered medications for this visit.     Musculoskeletal: Strength & Muscle Tone: within normal limits Gait & Station: normal Patient leans: N/A  Psychiatric Specialty Exam: Review of Systems  Psychiatric/Behavioral:  Positive for decreased concentration and dysphoric mood. The patient is nervous/anxious.   All other systems reviewed and are negative.   There were no vitals taken  for this visit.There is no height or weight on file to calculate BMI.  General Appearance: Casual and Fairly Groomed  Eye Contact:  Good  Speech:  Clear and Coherent  Volume:  Normal  Mood:  Anxious  Affect:  Congruent  Thought Process:  Goal Directed  Orientation:  Full (Time, Place, and Person)  Thought Content: Rumination   Suicidal Thoughts:  No  Homicidal Thoughts:  No  Memory:  Immediate;   Good Recent;   Good Remote;   Good  Judgement:  Good  Insight:  Good  Psychomotor Activity:  Normal  Concentration:  Concentration: Poor and Attention Span: Poor  Recall:  Good  Fund of Knowledge: Good  Language: Good  Akathisia:  No  Handed:  Right  AIMS (if indicated): not done  Assets:  Communication Skills Desire for Improvement Physical Health Resilience Social Support Talents/Skills  ADL's:  Intact  Cognition: WNL  Sleep:  Good   Screenings: PHQ2-9    Flowsheet Row Video Visit from 04/16/2022 in [facility] at [place] Video Visit from 01/16/2022 in [facility] at [place] Video Visit from 11/26/2021 in [facility] at [place] Video Visit from 09/24/2021 in [facility] Outpatient [facility] at [place] Counselor from 07/25/2021 in [facility] at [facility] Total Score 0 0 1 2 4   PHQ-9 Total Score -- -- -- 5 15      Flowsheet Row Video Visit from 04/16/2022 in [facility] at [place] Video Visit from 01/16/2022 in [facility] Outpatient Behavioral Health at [place] Video Visit from 11/26/2021 in [facility] Outpatient Behavioral Health at [place]  C-SSRS RISK CATEGORY No Risk No Risk No Risk        Assessment and Plan: This patient is a 43 year old female with a history depression anxiety and ADD.  We still have not been able to get the Abilify approved so we will try again to add Abilify 2 mg to the  Prozac 40 mg for her depression.  She will restart Xanax at 1 mg 3 times daily for anxiety.  The methylphenidate is not helping her focus so we will switch to Adderall 20 mg twice daily.  She will return to see me in 4 weeks  Collaboration of Care: Collaboration of Care: Primary Care Provider AEB notes to be shared with PCP at patient's request  Patient/Guardian was advised Release of Information must be obtained prior to any record release in order to collaborate their care with an outside provider. Patient/Guardian was advised if they have not already done so to contact the registration department to sign all necessary forms in order for [place] to release information regarding their care.   Consent: Patient/Guardian gives verbal consent for treatment and assignment of benefits for services provided during this visit. Patient/Guardian expressed understanding and agreed to proceed.    [name], MD 09/11/2023, 1:15 PM

## 2023-10-15 ENCOUNTER — Encounter (HOSPITAL_COMMUNITY): Payer: Self-pay | Admitting: Psychiatry

## 2023-10-15 ENCOUNTER — Telehealth (HOSPITAL_COMMUNITY): Payer: BC Managed Care – PPO | Admitting: Psychiatry

## 2023-10-15 DIAGNOSIS — F900 Attention-deficit hyperactivity disorder, predominantly inattentive type: Secondary | ICD-10-CM | POA: Diagnosis not present

## 2023-10-15 DIAGNOSIS — F411 Generalized anxiety disorder: Secondary | ICD-10-CM | POA: Diagnosis not present

## 2023-10-15 DIAGNOSIS — F331 Major depressive disorder, recurrent, moderate: Secondary | ICD-10-CM | POA: Diagnosis not present

## 2023-10-15 MED ORDER — ARIPIPRAZOLE 2 MG PO TABS: 2.0000 mg | ORAL_TABLET | Freq: Every day | ORAL | 2 refills | Status: DC

## 2023-10-15 MED ORDER — FLUOXETINE HCL 40 MG PO CAPS: 40.0000 mg | ORAL_CAPSULE | Freq: Every day | ORAL | 1 refills | Status: DC

## 2023-10-15 MED ORDER — AMPHETAMINE-DEXTROAMPHETAMINE 20 MG PO TABS: 20.0000 mg | ORAL_TABLET | Freq: Two times a day (BID) | ORAL | 0 refills | Status: DC

## 2023-10-15 MED ORDER — ALPRAZOLAM 1 MG PO TABS: 1.0000 mg | ORAL_TABLET | Freq: Three times a day (TID) | ORAL | 2 refills | Status: DC | PRN

## 2023-10-15 NOTE — Progress Notes (Signed)
 Virtual Visit via Video Note  I connected with Tina Weber on 10/15/23 at  1:00 PM EST by a video enabled telemedicine application and verified that I am speaking with the correct person using two identifiers.  Location: Patient: home Provider: office   I discussed the limitations of evaluation and management by telemedicine and the availability of in person appointments. The patient expressed understanding and agreed to proceed.      I discussed the assessment and treatment plan with the patient. The patient was provided an opportunity to ask questions and all were answered. The patient agreed with the plan and demonstrated an understanding of the instructions.   The patient was advised to call back or seek an in-person evaluation if the symptoms worsen or if the condition fails to improve as anticipated.  I provided 20 minutes of non-face-to-face time during this encounter.   [name], MD  [facility] MD/PA/NP OP Progress Note  10/15/2023 1:11 PM JESSALYNN MCCOWAN  MRN:  [id number]  Chief Complaint:  Chief Complaint  Patient presents with   Anxiety   Depression   ADD   Follow-up   HPI: This patient is a 43 year old divorced white female who lives with her daughter in [place].  She works for a [employer]ilities [employer].  The patient returns for follow-up after 4 weeks regarding her depression anxiety and ADD.  She states that she is feeling much better the last few weeks.  She was able to finally get the Abilify and it has helped her mood.  She is less tired and less depressed.  The Adderall is working much better for her focus on the methylphenidate and she is having less side effects like anxiety and sweating.  She is sleeping well.  Her energy is good and she is doing well at her job.  She denies any thoughts of self-harm or suicide. Visit Diagnosis:    ICD-10-CM   1. Moderate episode of recurrent major depressive disorder (HCC)  F33.1     2. Attention deficit hyperactivity  disorder (ADHD), predominantly inattentive type  F90.0     3. Generalized anxiety disorder  F41.1       Past Psychiatric History: Patient has history of depression high school as well as postpartum pression after the birth of her daughter which responded to Prozac.  She had ADHD as a child  Past Medical History:  Past Medical History:  Diagnosis Date   Anxiety    Depression    Migraines    Seizures (HCC)    Last seizure almost one year ago   Supervision of normal pregnancy in first trimester 07/19/2014    [facility] [place] straw 43 yo wm 1st Dating By LMP and [place] Pap  07/19/14 GC/CT Initial:                36+wks: Genetic Screen NT/IT:  CF screen  Anatomic [place]  Flu vaccine  Tdap Recommended ~ 28wks Glucose Screen  2 hr GBS  Feed Preference  Contraception  Circumcision  Childbirth Classes  Pediatrician      Past Surgical History:  Procedure Laterality Date   CHOLECYSTECTOMY     WISDOM TOOTH EXTRACTION      Family Psychiatric History: See below  Family History:  Family History  Problem Relation Age of Onset   Congestive Heart Failure Maternal Grandmother    Diabetes Maternal Grandfather    Hypertension Father    Anxiety disorder Father    Cancer Mother  thyroid   Anxiety disorder Mother     Social History:  Social History   Socioeconomic History   Marital status: Divorced    Spouse name: Not on file   Number of children: Not on file   Years of education: Not on file   Highest education level: Not on file  Occupational History   Not on file  Tobacco Use   Smoking status: Never   Smokeless tobacco: Never  Vaping Use   Vaping status: Never Used  Substance and Sexual Activity   Alcohol use: No    Comment: glass of wine on weekends sometimes   Drug use: No    Comment: 01-05-16 per pt no   Sexual activity: Yes    Birth control/protection: I.U.D.  Other Topics Concern   Not on file  Social History Narrative   Not on file   Social Drivers of  Health   Financial Resource Strain: Not on file  Food Insecurity: Not on file  Transportation Needs: Not on file  Physical Activity: Not on file  Stress: Not on file  Social Connections: Unknown (12/16/2021)   Received from [facility]   Social Network    Social Network: Not on file    Allergies: No Known Allergies  Metabolic Disorder Labs: No results found for: "HGBA1C", "MPG" No results found for: "PROLACTIN" No results found for: "CHOL", "TRIG", "HDL", "CHOLHDL", "VLDL", "LDLCALC" Lab Results  Component Value Date   TSH 0.674 07/19/2014    Therapeutic Level Labs: No results found for: "LITHIUM" No results found for: "VALPROATE" No results found for: "CBMZ"  Current Medications: Current Outpatient Medications  Medication Sig Dispense Refill   amphetamine-dextroamphetamine (ADDERALL) 20 MG tablet Take 1 tablet (20 mg total) by mouth 2 (two) times daily. 60 tablet 0   amphetamine-dextroamphetamine (ADDERALL) 20 MG tablet Take 1 tablet (20 mg total) by mouth 2 (two) times daily. 60 tablet 0   ALPRAZolam (XANAX) 1 MG tablet Take 1 tablet (1 mg total) by mouth 3 (three) times daily as needed for anxiety. 90 tablet 2   amphetamine-dextroamphetamine (ADDERALL) 20 MG tablet Take 1 tablet (20 mg total) by mouth 2 (two) times daily. 60 tablet 0   ARIPiprazole (ABILIFY) 2 MG tablet Take 1 tablet (2 mg total) by mouth daily. 30 tablet 2   FLUoxetine (PROZAC) 40 MG capsule Take 1 capsule (40 mg total) by mouth daily. 90 capsule 1   PARAGARD INTRAUTERINE COPPER IU by Intrauterine route.     No current facility-administered medications for this visit.     Musculoskeletal: Strength & Muscle Tone: within normal limits Gait & Station: normal Patient leans: N/A  Psychiatric Specialty Exam: Review of Systems  All other systems reviewed and are negative.   There were no vitals taken for this visit.There is no height or weight on file to calculate BMI.  General  Appearance: Casual and Fairly Groomed  Eye Contact:  Good  Speech:  Clear and Coherent  Volume:  Normal  Mood:  Euthymic  Affect:  Congruent  Thought Process:  Goal Directed  Orientation:  Full (Time, Place, and Person)  Thought Content: WDL   Suicidal Thoughts:  No  Homicidal Thoughts:  No  Memory:  Immediate;   Good Recent;   Good Remote;   Good  Judgement:  Good  Insight:  Good  Psychomotor Activity:  Normal  Concentration:  Concentration: Good and Attention Span: Good  Recall:  Good  Fund of Knowledge: Good  Language: Good  Akathisia:  No  Handed:  Right  AIMS (if indicated): not done  Assets:  Communication Skills Desire for Improvement Physical Health Resilience Social Support Talents/Skills  ADL's:  Intact  Cognition: WNL  Sleep:  Good   Screenings: PHQ2-9    Flowsheet Row Video Visit from 04/16/2022 in [facility] at [place] Video Visit from 01/16/2022 in [facility] at [place] Video Visit from 11/26/2021 in [facility] at [place] Video Visit from 09/24/2021 in [facility] Outpatient [facility] at [place] Counselor from 07/25/2021 in [facility]patient [facility] at [facility] Total Score 0 0 1 2 4   PHQ-9 Total Score -- -- -- 5 15      Flowsheet Row Video Visit from 04/16/2022 in [facility] at [place] Video Visit from 01/16/2022 in [facility] Outpatient [facility] at [place] Video Visit from 11/26/2021 in [facility] Outpatient Behavioral Health at [place]  C-SSRS RISK CATEGORY No Risk No Risk No Risk        Assessment and Plan: This patient is a 43 year old female with a history of depression anxiety and ADHD.  She is doing well on her current regimen.  She will continue Prozac 40 mg for depression along with Abilify 2 mg for antidepressant augmentation.  She will continue Xanax 1 mg 3 times daily  as needed for anxiety and Adderall 20 mg twice daily for ADD.  She will return to see me in 3 months  Collaboration of Care: Collaboration of Care: Primary Care Provider AEB notes will be shared with PCP at patient's request  Patient/Guardian was advised Release of Information must be obtained prior to any record release in order to collaborate their care with an outside provider. Patient/Guardian was advised if they have not already done so to contact the registration department to sign all necessary forms in order for [place] to release information regarding their care.   Consent: Patient/Guardian gives verbal consent for treatment and assignment of benefits for services provided during this visit. Patient/Guardian expressed understanding and agreed to proceed.    [name], MD 10/15/2023, 1:11 PM

## 2023-10-20 ENCOUNTER — Other Ambulatory Visit (HOSPITAL_COMMUNITY): Payer: Self-pay | Admitting: Psychiatry

## 2023-10-20 ENCOUNTER — Telehealth (HOSPITAL_COMMUNITY): Payer: Self-pay | Admitting: *Deleted

## 2023-10-20 MED ORDER — AMPHETAMINE-DEXTROAMPHETAMINE 20 MG PO TABS
20.0000 mg | ORAL_TABLET | Freq: Two times a day (BID) | ORAL | 0 refills | Status: DC
Start: 2023-10-20 — End: 2024-01-05

## 2023-10-20 MED ORDER — AMPHETAMINE-DEXTROAMPHETAMINE 20 MG PO TABS
20.0000 mg | ORAL_TABLET | Freq: Two times a day (BID) | ORAL | 0 refills | Status: DC
Start: 2023-10-20 — End: 2023-11-12

## 2023-10-20 MED ORDER — FLUOXETINE HCL 40 MG PO CAPS: 40.0000 mg | ORAL_CAPSULE | Freq: Every day | ORAL | 1 refills | Status: DC

## 2023-10-20 MED ORDER — AMPHETAMINE-DEXTROAMPHETAMINE 20 MG PO TABS
20.0000 mg | ORAL_TABLET | Freq: Two times a day (BID) | ORAL | 0 refills | Status: DC
Start: 2023-10-20 — End: 2023-12-15

## 2023-10-20 MED ORDER — ARIPIPRAZOLE 2 MG PO TABS: 2.0000 mg | ORAL_TABLET | Freq: Every day | ORAL | 2 refills | Status: DC

## 2023-10-20 MED ORDER — ALPRAZOLAM 1 MG PO TABS: 1.0000 mg | ORAL_TABLET | Freq: Three times a day (TID) | ORAL | 2 refills | Status: DC | PRN

## 2023-10-20 NOTE — Telephone Encounter (Signed)
 Patient called stating her original pharmacy do not have her Adderall in stock. Per pt, she would like for provider to please sent it to [facility]. Per pt she is would like for provider to please send in all of her other medications to [facility].

## 2023-10-20 NOTE — Telephone Encounter (Signed)
 sent

## 2023-10-20 NOTE — Telephone Encounter (Signed)
 lmom

## 2023-11-12 ENCOUNTER — Telehealth (HOSPITAL_COMMUNITY): Payer: Self-pay

## 2023-11-12 ENCOUNTER — Other Ambulatory Visit (HOSPITAL_COMMUNITY): Payer: Self-pay | Admitting: Psychiatry

## 2023-11-12 MED ORDER — FLUOXETINE HCL 40 MG PO CAPS: 40.0000 mg | ORAL_CAPSULE | Freq: Every day | ORAL | 1 refills | Status: DC

## 2023-11-12 MED ORDER — ARIPIPRAZOLE 2 MG PO TABS: 2.0000 mg | ORAL_TABLET | Freq: Every day | ORAL | 2 refills | Status: DC

## 2023-11-12 MED ORDER — AMPHETAMINE-DEXTROAMPHETAMINE 20 MG PO TABS: 20.0000 mg | ORAL_TABLET | Freq: Two times a day (BID) | ORAL | 0 refills | Status: DC

## 2023-11-12 MED ORDER — ALPRAZOLAM 1 MG PO TABS
1.0000 mg | ORAL_TABLET | Freq: Three times a day (TID) | ORAL | 2 refills | Status: DC | PRN
Start: 2023-11-12 — End: 2024-01-05

## 2023-11-12 NOTE — Telephone Encounter (Signed)
 sent

## 2023-11-12 NOTE — Telephone Encounter (Signed)
 Pt called in requesting that all of her prescriptions be changed over to [facility] on [address] in [place]. Pt scheduled 01/05/24. Please advise.

## 2023-11-13 NOTE — Telephone Encounter (Signed)
 done

## 2023-12-15 ENCOUNTER — Other Ambulatory Visit (HOSPITAL_COMMUNITY): Payer: Self-pay | Admitting: Psychiatry

## 2023-12-15 ENCOUNTER — Telehealth (HOSPITAL_COMMUNITY): Payer: Self-pay

## 2023-12-15 MED ORDER — AMPHETAMINE-DEXTROAMPHETAMINE 20 MG PO TABS
20.0000 mg | ORAL_TABLET | Freq: Two times a day (BID) | ORAL | 0 refills | Status: DC
Start: 2023-12-15 — End: 2024-01-05

## 2023-12-15 NOTE — Telephone Encounter (Signed)
 sent

## 2023-12-15 NOTE — Telephone Encounter (Signed)
 Pt called in needing a refill on her amphetamine -dextroamphetamine  (ADDERALL) 20 MG tablet sent to [facility] [address] in [place]. Pt scheduled for 01/05/24. Please advise.

## 2023-12-15 NOTE — Telephone Encounter (Signed)
 Pt aware rx was sent to pharmacy and verbalized understanding

## 2024-01-05 ENCOUNTER — Encounter (HOSPITAL_COMMUNITY): Payer: Self-pay | Admitting: Psychiatry

## 2024-01-05 ENCOUNTER — Telehealth (HOSPITAL_COMMUNITY): Admitting: Psychiatry

## 2024-01-05 DIAGNOSIS — F411 Generalized anxiety disorder: Secondary | ICD-10-CM | POA: Diagnosis not present

## 2024-01-05 DIAGNOSIS — F900 Attention-deficit hyperactivity disorder, predominantly inattentive type: Secondary | ICD-10-CM

## 2024-01-05 DIAGNOSIS — F331 Major depressive disorder, recurrent, moderate: Secondary | ICD-10-CM

## 2024-01-05 MED ORDER — AMPHETAMINE-DEXTROAMPHETAMINE 30 MG PO TABS: 30.0000 mg | ORAL_TABLET | Freq: Two times a day (BID) | ORAL | 0 refills | Status: DC

## 2024-01-05 MED ORDER — ARIPIPRAZOLE 2 MG PO TABS: 2.0000 mg | ORAL_TABLET | Freq: Every day | ORAL | 2 refills | Status: DC

## 2024-01-05 MED ORDER — AMPHETAMINE-DEXTROAMPHETAMINE 30 MG PO TABS
30.0000 mg | ORAL_TABLET | Freq: Two times a day (BID) | ORAL | 0 refills | Status: DC
Start: 2024-01-05 — End: 2024-04-26

## 2024-01-05 MED ORDER — FLUOXETINE HCL 40 MG PO CAPS: 40.0000 mg | ORAL_CAPSULE | Freq: Every day | ORAL | 1 refills | Status: DC

## 2024-01-05 MED ORDER — ALPRAZOLAM 1 MG PO TABS: 1.0000 mg | ORAL_TABLET | Freq: Three times a day (TID) | ORAL | 2 refills | Status: DC | PRN

## 2024-01-05 NOTE — Progress Notes (Signed)
 Virtual Visit via Video Note  I connected with Tina Weber on 01/05/24 at  3:40 PM EDT by a video enabled telemedicine application and verified that I am speaking with the correct person using two identifiers.  Location: Patient: home Provider: office   I discussed the limitations of evaluation and management by telemedicine and the availability of in person appointments. The patient expressed understanding and agreed to proceed.     I discussed the assessment and treatment plan with the patient. The patient was provided an opportunity to ask questions and all were answered. The patient agreed with the plan and demonstrated an understanding of the instructions.   The patient was advised to call back or seek an in-person evaluation if the symptoms worsen or if the condition fails to improve as anticipated.  I provided 20 minutes of non-face-to-face time during this encounter.   Alfredia Annas, MD  [facility] MD/PA/NP OP Progress Note  01/05/2024 3:59 PM Tina Weber  MRN:  [id number]  Chief Complaint:  Chief Complaint  Patient presents with   ADHD   Follow-up   Depression   Anxiety   HPI: This patient is a 43 year old divorced white female who lives with her daughter in [place].  She works for [employer].  The patient returns for follow-up after 3 months regarding her depression anxiety and ADD.  She states that her mood has been good and she denies depression.  She is sleeping well and denies significant anxiety.  However her focus is not good.  She takes the Adderall 20 mg twice a day but is wearing off by 2:00 in the afternoon.  I suggested that we go up to 30 mg twice a day and she could also split it into 50 mg and take it 4 times a day.  We are going to see if insurance will cover this. Visit Diagnosis:    ICD-10-CM   1. Attention deficit hyperactivity disorder (ADHD), predominantly inattentive type  F90.0     2. Moderate episode of recurrent major depressive  disorder (HCC)  F33.1     3. Generalized anxiety disorder  F41.1       Past Psychiatric History: Patient has history of depression high school as well as postpartum pression after the birth of her daughter which responded to Prozac . She had ADHD as a child   Past Medical History:  Past Medical History:  Diagnosis Date   Anxiety    Depression    Migraines    Seizures (HCC)    Last seizure almost one year ago   Supervision of normal pregnancy in first trimester 07/19/2014    [facility] tessla spurling 43 yo wm 1st Dating By LMP and US  Pap  07/19/14 GC/CT Initial:                36+wks: Genetic Screen NT/IT:  CF screen  Anatomic US   Flu vaccine  Tdap Recommended ~ 28wks Glucose Screen  2 hr GBS  Feed Preference  Contraception  Circumcision  Childbirth Classes  Pediatrician      Past Surgical History:  Procedure Laterality Date   CHOLECYSTECTOMY     WISDOM TOOTH EXTRACTION      Family Psychiatric History: See below  Family History:  Family History  Problem Relation Age of Onset   Congestive Heart Failure Maternal Grandmother    Diabetes Maternal Grandfather    Hypertension Father    Anxiety disorder Father    Cancer Mother  thyroid   Anxiety disorder Mother     Social History:  Social History   Socioeconomic History   Marital status: Divorced    Spouse name: Not on file   Number of children: Not on file   Years of education: Not on file   Highest education level: Not on file  Occupational History   Not on file  Tobacco Use   Smoking status: Never   Smokeless tobacco: Never  Vaping Use   Vaping status: Never Used  Substance and Sexual Activity   Alcohol use: No    Comment: glass of wine on weekends sometimes   Drug use: No    Comment: 01-05-16 per pt no   Sexual activity: Yes    Birth control/protection: I.U.D.  Other Topics Concern   Not on file  Social History Narrative   Not on file   Social Drivers of Health   Financial Resource  Strain: Not on file  Food Insecurity: Not on file  Transportation Needs: Not on file  Physical Activity: Not on file  Stress: Not on file  Social Connections: Unknown (12/16/2021)   Received from [facility]   Social Network    Social Network: Not on file    Allergies: No Known Allergies  Metabolic Disorder Labs: No results found for: "HGBA1C", "MPG" No results found for: "PROLACTIN" No results found for: "CHOL", "TRIG", "HDL", "CHOLHDL", "VLDL", "LDLCALC" Lab Results  Component Value Date   TSH 0.674 07/19/2014    Therapeutic Level Labs: No results found for: "LITHIUM" No results found for: "VALPROATE" No results found for: "CBMZ"  Current Medications: Current Outpatient Medications  Medication Sig Dispense Refill   amphetamine -dextroamphetamine  (ADDERALL) 30 MG tablet Take 1 tablet by mouth 2 (two) times daily. 60 tablet 0   amphetamine -dextroamphetamine  (ADDERALL) 30 MG tablet Take 1 tablet by mouth 2 (two) times daily. 60 tablet 0   amphetamine -dextroamphetamine  (ADDERALL) 30 MG tablet Take 1 tablet by mouth 2 (two) times daily. 60 tablet 0   ALPRAZolam  (XANAX ) 1 MG tablet Take 1 tablet (1 mg total) by mouth 3 (three) times daily as needed for anxiety. 90 tablet 2   ARIPiprazole  (ABILIFY ) 2 MG tablet Take 1 tablet (2 mg total) by mouth daily. 30 tablet 2   FLUoxetine  (PROZAC ) 40 MG capsule Take 1 capsule (40 mg total) by mouth daily. 90 capsule 1   PARAGARD INTRAUTERINE COPPER IU by Intrauterine route.     No current facility-administered medications for this visit.     Musculoskeletal: Strength & Muscle Tone: within normal limits Gait & Station: normal Patient leans: N/A  Psychiatric Specialty Exam: Review of Systems  Psychiatric/Behavioral:  Positive for decreased concentration.   All other systems reviewed and are negative.   There were no vitals taken for this visit.There is no height or weight on file to calculate BMI.  General Appearance:  Casual and Fairly Groomed  Eye Contact:  Good  Speech:  Clear and Coherent  Volume:  Normal  Mood:  Euthymic  Affect:  Congruent  Thought Process:  Goal Directed  Orientation:  Full (Time, Place, and Person)  Thought Content: WDL   Suicidal Thoughts:  No  Homicidal Thoughts:  No  Memory:  Immediate;   Good Recent;   Good Remote;   Fair  Judgement:  Good  Insight:  Good  Psychomotor Activity:  Normal  Concentration:  Concentration: Poor and Attention Span: Poor  Recall:  Good  Fund of Knowledge: Good  Language: Good  Akathisia:  No  Handed:  Right  AIMS (if indicated): not done  Assets:  Communication Skills Desire for Improvement Physical Health Resilience Social Support Vocational/Educational  ADL's:  Intact  Cognition: WNL  Sleep:  Good   Screenings: PHQ2-9    Flowsheet Row Video Visit from 04/16/2022 in [facility] at [place] Video Visit from 01/16/2022 in [facility] at [place] Video Visit from 11/26/2021 in [facility] Outpatient Behavioral Health at [place] Video Visit from 09/24/2021 in [facility] Outpatient [facility]al [facility] at [place] Counselor from 07/25/2021 in [facility]patient Behavioral [facility] at [facility] Total Score 0 0 1 2 4   PHQ-9 Total Score -- -- -- 5 15      Flowsheet Row Video Visit from 04/16/2022 in [facility] at [place] Video Visit from 01/16/2022 in [facility] Outpatient Behavior[facility] at [place] Video Visit from 11/26/2021 in [facility] Out[facility] Behavioral Health at [place]  C-SSRS RISK CATEGORY No Risk No Risk No Risk        Assessment and Plan: This patient is a 43 year old female with a history of depression anxiety and ADHD.  The Adderall 20 mg twice daily is not lasting so we will increase it to 30 mg twice daily.  She will continue Prozac  40 mg for depression and Abilify  2 mg for antidepressant augmentation and  Xanax  1 mg 3 times daily for anxiety.  She will return to see me in 3 months  Collaboration of Care: Collaboration of Care: Primary Care Provider AEB notes to be shared with PCP at patient's request  Patient/Guardian was advised Release of Information must be obtained prior to any record release in order to collaborate their care with an outside provider. Patient/Guardian was advised if they have not already done so to contact the registration department to sign all necessary forms in order for us  to release information regarding their care.   Consent: Patient/Guardian gives verbal consent for treatment and assignment of benefits for services provided during this visit. Patient/Guardian expressed understanding and agreed to proceed.    Alfredia Annas, MD 01/05/2024, 3:59 PM

## 2024-04-09 ENCOUNTER — Other Ambulatory Visit (HOSPITAL_COMMUNITY): Payer: Self-pay | Admitting: Psychiatry

## 2024-04-09 ENCOUNTER — Telehealth (HOSPITAL_COMMUNITY): Payer: Self-pay | Admitting: *Deleted

## 2024-04-09 MED ORDER — ARIPIPRAZOLE 2 MG PO TABS: 2.0000 mg | ORAL_TABLET | Freq: Every day | ORAL | 2 refills | Status: DC

## 2024-04-09 MED ORDER — AMPHETAMINE-DEXTROAMPHETAMINE 30 MG PO TABS: 30.0000 mg | ORAL_TABLET | Freq: Two times a day (BID) | ORAL | 0 refills | Status: DC

## 2024-04-09 MED ORDER — ALPRAZOLAM 1 MG PO TABS: 1.0000 mg | ORAL_TABLET | Freq: Three times a day (TID) | ORAL | 2 refills | Status: DC | PRN

## 2024-04-09 NOTE — Telephone Encounter (Signed)
 sent

## 2024-04-09 NOTE — Telephone Encounter (Signed)
 Patent called stating her medication will be out by the end of this month. Per pt due to provider being out of the office next week, if provider could please filled her Adderall and Xanax  and Abilify  sent to [facility] in [place].

## 2024-04-26 ENCOUNTER — Encounter (HOSPITAL_COMMUNITY): Payer: Self-pay | Admitting: Psychiatry

## 2024-04-26 ENCOUNTER — Telehealth (INDEPENDENT_AMBULATORY_CARE_PROVIDER_SITE_OTHER): Admitting: Psychiatry

## 2024-04-26 DIAGNOSIS — F900 Attention-deficit hyperactivity disorder, predominantly inattentive type: Secondary | ICD-10-CM

## 2024-04-26 DIAGNOSIS — F331 Major depressive disorder, recurrent, moderate: Secondary | ICD-10-CM | POA: Diagnosis not present

## 2024-04-26 DIAGNOSIS — F411 Generalized anxiety disorder: Secondary | ICD-10-CM | POA: Diagnosis not present

## 2024-04-26 MED ORDER — ALPRAZOLAM 1 MG PO TABS: 1.0000 mg | ORAL_TABLET | Freq: Two times a day (BID) | ORAL | 2 refills | Status: DC | PRN

## 2024-04-26 MED ORDER — AMPHETAMINE-DEXTROAMPHETAMINE 30 MG PO TABS: 30.0000 mg | ORAL_TABLET | Freq: Two times a day (BID) | ORAL | 0 refills | Status: DC

## 2024-04-26 MED ORDER — FLUOXETINE HCL 40 MG PO CAPS: 40.0000 mg | ORAL_CAPSULE | Freq: Every day | ORAL | 1 refills | Status: DC

## 2024-04-26 MED ORDER — ARIPIPRAZOLE 2 MG PO TABS: 2.0000 mg | ORAL_TABLET | Freq: Every day | ORAL | 2 refills | Status: DC

## 2024-04-26 NOTE — Progress Notes (Signed)
 Virtual Visit via Video Note  I connected with Tina Weber on 04/26/24 at  9:20 AM EDT by a video enabled telemedicine application and verified that I am speaking with the correct person using two identifiers.  Location: Patient: home Provider: office   I discussed the limitations of evaluation and management by telemedicine and the availability of in person appointments. The patient expressed understanding and agreed to proceed.      I discussed the assessment and treatment plan with the patient. The patient was provided an opportunity to ask questions and all were answered. The patient agreed with the plan and demonstrated an understanding of the instructions.   The patient was advised to call back or seek an in-person evaluation if the symptoms worsen or if the condition fails to improve as anticipated.  I provided 20 minutes of non-face-to-face time during this encounter.   Barnie Gull, MD  [facility] MD/PA/NP OP Progress Note  04/26/2024 9:39 AM Tina Weber  MRN:  [id number]  Chief Complaint:  Chief Complaint  Patient presents with   Depression   Anxiety   ADD   Follow-up   HPI: This patient is a 43 year old divorced white female who lives with her daughter in [place]. She works for [employer].   The patient returns for follow-up after 3 months regarding her depression anxiety and ADD.  She states that she is doing very well.  Her mood has been good and her anxiety is well-managed.  She is only taking the Xanax  twice daily now.  She is sleeping very well.  She is enjoying her free time with her daughter and also is enjoying new responsibilities at work.  The Adderall is working well for her focus.  She denies any thoughts of self-harm or suicide Visit Diagnosis:    ICD-10-CM   1. Attention deficit hyperactivity disorder (ADHD), predominantly inattentive type  F90.0     2. Moderate episode of recurrent major depressive disorder (HCC)  F33.1     3.  Generalized anxiety disorder  F41.1       Past Psychiatric History: Patient has history of depression in high school as well as postpartum pression after the birth of her daughter which responded to Prozac . She had ADHD as a child   Past Medical History:  Past Medical History:  Diagnosis Date   Anxiety    Depression    Migraines    Seizures (HCC)    Last seizure almost one year ago   Supervision of normal pregnancy in first trimester 07/19/2014    [facility] FOB starr urias 43 yo wm 1st Dating By LMP and US  Pap  07/19/14 GC/CT Initial:                36+wks: Genetic Screen NT/IT:  CF screen  Anatomic US   Flu vaccine  Tdap Recommended ~ 28wks Glucose Screen  2 hr GBS  Feed Preference  Contraception  Circumcision  Childbirth Classes  Pediatrician      Past Surgical History:  Procedure Laterality Date   CHOLECYSTECTOMY     WISDOM TOOTH EXTRACTION      Family Psychiatric History: See below  Family History:  Family History  Problem Relation Age of Onset   Congestive Heart Failure Maternal Grandmother    Diabetes Maternal Grandfather    Hypertension Father    Anxiety disorder Father    Cancer Mother        thyroid   Anxiety disorder Mother  Social History:  Social History   Socioeconomic History   Marital status: Divorced    Spouse name: Not on file   Number of children: Not on file   Years of education: Not on file   Highest education level: Not on file  Occupational History   Not on file  Tobacco Use   Smoking status: Never   Smokeless tobacco: Never  Vaping Use   Vaping status: Never Used  Substance and Sexual Activity   Alcohol use: No    Comment: glass of wine on weekends sometimes   Drug use: No    Comment: 01-05-16 per pt no   Sexual activity: Yes    Birth control/protection: I.U.D.  Other Topics Concern   Not on file  Social History Narrative   Not on file   Social Drivers of Health   Financial Resource Strain: Not on file  Food  Insecurity: Not on file  Transportation Needs: Not on file  Physical Activity: Not on file  Stress: Not on file  Social Connections: Unknown (12/16/2021)   [facility] from [facility]   Social Network    Social Network: Not on file    Allergies: No Known Allergies  Metabolic Disorder Labs: No results found for: HGBA1C, MPG No results found for: PROLACTIN No results found for: CHOL, TRIG, HDL, CHOLHDL, VLDL, LDLCALC Lab Results  Component Value Date   TSH 0.674 07/19/2014    Therapeutic Level Labs: No results found for: LITHIUM No results found for: VALPROATE No results found for: CBMZ  Current Medications: Current Outpatient Medications  Medication Sig Dispense Refill   ALPRAZolam  (XANAX ) 1 MG tablet Take 1 tablet (1 mg total) by mouth 2 (two) times daily as needed for anxiety. 60 tablet 2   amphetamine -dextroamphetamine  (ADDERALL) 30 MG tablet Take 1 tablet by mouth 2 (two) times daily. 60 tablet 0   amphetamine -dextroamphetamine  (ADDERALL) 30 MG tablet Take 1 tablet by mouth 2 (two) times daily. 60 tablet 0   amphetamine -dextroamphetamine  (ADDERALL) 30 MG tablet Take 1 tablet by mouth 2 (two) times daily. 60 tablet 0   ARIPiprazole  (ABILIFY ) 2 MG tablet Take 1 tablet (2 mg total) by mouth daily. 30 tablet 2   FLUoxetine  (PROZAC ) 40 MG capsule Take 1 capsule (40 mg total) by mouth daily. 90 capsule 1   PARAGARD INTRAUTERINE COPPER IU by Intrauterine route.     No current facility-administered medications for this visit.     Musculoskeletal: Strength & Muscle Tone: within normal limits Gait & Station: normal Patient leans: N/A  Psychiatric Specialty Exam: Review of Systems  All other systems reviewed and are negative.   There were no vitals taken for this visit.There is no height or weight on file to calculate BMI.  General Appearance: Casual and Fairly Groomed  Eye Contact:  Good  Speech:  Clear and Coherent  Volume:  Normal  Mood:   Euthymic  Affect:  Congruent  Thought Process:  Goal Directed  Orientation:  Full (Time, Place, and Person)  Thought Content: WDL   Suicidal Thoughts:  No  Homicidal Thoughts:  No  Memory:  Immediate;   Good Recent;   Good Remote;   Fair  Judgement:  Good  Insight:  Good  Psychomotor Activity:  Normal  Concentration:  Concentration: Good and Attention Span: Good  Recall:  Good  Fund of Knowledge: Good  Language: Good  Akathisia:  No  Handed:  Right  AIMS (if indicated): not done  Assets:  Communication Skills Desire for Improvement  Physical Health Resilience Social Support Talents/Skills  ADL's:  Intact  Cognition: WNL  Sleep:  Good   Screenings: PHQ2-9    Flowsheet Row Video Visit from 04/16/2022 in [facility] [place] Video Visit from 01/16/2022 in [facility] at [place] Video Visit from 11/26/2021 in [facility] at [place] Video Visit from 09/24/2021 in [facility] at [place] Counselor from 07/25/2021 in [facility] at [facility] Total Score 0 0 1 2 4   PHQ-9 Total Score -- -- -- 5 15   Flowsheet Row Video Visit from 04/16/2022 in [facility] at [place] Video Visit from 01/16/2022 in [facility] at [place] Video Visit from 11/26/2021 in [facility] at [place]  C-SSRS RISK CATEGORY No Risk No Risk No Risk     Assessment and Plan: This patient is a 43 year old female with a history depression anxiety and ADHD.  She is doing well on her current regimen.  She will continue Prozac  40 mg daily for depression, Abilify  2 mg daily for depression augmentation, Xanax  1 mg twice daily for anxiety and Adderall 30 mg twice daily for ADD.  She will return to see me in 3 months  Collaboration of Care: Collaboration of Care: Primary Care  Provider AEB notes will be shared with PCP at patient's request  Patient/Guardian was advised Release of Information must be obtained prior to any record release in order to collaborate their care with an outside provider. Patient/Guardian was advised if they have not already done so to contact the registration department to sign all necessary forms in order for us  to release information regarding their care.   Consent: Patient/Guardian gives verbal consent for treatment and assignment of benefits for services provided during this visit. Patient/Guardian expressed understanding and agreed to proceed.    Barnie Gull, MD 04/26/2024, 9:39 AM

## 2024-09-02 ENCOUNTER — Telehealth (HOSPITAL_COMMUNITY): Payer: Self-pay

## 2024-09-02 ENCOUNTER — Telehealth (HOSPITAL_COMMUNITY): Admitting: Psychiatry

## 2024-09-02 ENCOUNTER — Other Ambulatory Visit (HOSPITAL_COMMUNITY): Payer: Self-pay | Admitting: Psychiatry

## 2024-09-02 MED ORDER — FLUOXETINE HCL 40 MG PO CAPS: 40.0000 mg | ORAL_CAPSULE | Freq: Every day | ORAL | 1 refills | Status: DC

## 2024-09-02 MED ORDER — AMPHETAMINE-DEXTROAMPHETAMINE 30 MG PO TABS: 30.0000 mg | ORAL_TABLET | Freq: Two times a day (BID) | ORAL | 0 refills | Status: DC

## 2024-09-02 MED ORDER — ALPRAZOLAM 1 MG PO TABS: 1.0000 mg | ORAL_TABLET | Freq: Two times a day (BID) | ORAL | 2 refills | Status: DC | PRN

## 2024-09-02 NOTE — Telephone Encounter (Signed)
 Pt's appt was changed from 09/02/24 to 09/08/24 she needs a refill on her adderall, prozac , and abilify  sent to [facility] on [address]. Please advise.

## 2024-09-02 NOTE — Telephone Encounter (Signed)
 Called no answer left vm

## 2024-09-02 NOTE — Telephone Encounter (Signed)
 sent

## 2024-09-08 ENCOUNTER — Telehealth (HOSPITAL_COMMUNITY): Admitting: Psychiatry

## 2024-09-21 ENCOUNTER — Telehealth (HOSPITAL_COMMUNITY): Admitting: Psychiatry

## 2024-09-21 ENCOUNTER — Encounter (HOSPITAL_COMMUNITY): Payer: Self-pay | Admitting: Psychiatry

## 2024-09-21 DIAGNOSIS — F900 Attention-deficit hyperactivity disorder, predominantly inattentive type: Secondary | ICD-10-CM

## 2024-09-21 DIAGNOSIS — F331 Major depressive disorder, recurrent, moderate: Secondary | ICD-10-CM

## 2024-09-21 DIAGNOSIS — F411 Generalized anxiety disorder: Secondary | ICD-10-CM

## 2024-09-21 MED ORDER — FLUOXETINE HCL 40 MG PO CAPS: 40.0000 mg | ORAL_CAPSULE | Freq: Every day | ORAL | 1 refills | Status: AC

## 2024-09-21 MED ORDER — AMPHETAMINE-DEXTROAMPHETAMINE 30 MG PO TABS: 30.0000 mg | ORAL_TABLET | Freq: Two times a day (BID) | ORAL | 0 refills | Status: AC

## 2024-09-21 MED ORDER — ALPRAZOLAM 1 MG PO TABS: 1.0000 mg | ORAL_TABLET | Freq: Two times a day (BID) | ORAL | 2 refills | Status: AC | PRN

## 2024-09-21 MED ORDER — ARIPIPRAZOLE 2 MG PO TABS: 2.0000 mg | ORAL_TABLET | Freq: Every day | ORAL | 2 refills | Status: AC
